# Patient Record
Sex: Female | Born: 1950 | Race: White | Hispanic: No | Marital: Married | Smoking: Former smoker
Health system: Southern US, Community
[De-identification: ages and names within clinical notes are randomized; demographics above are authoritative.]

## PROBLEM LIST (undated history)

## (undated) DIAGNOSIS — Z1211 Encounter for screening for malignant neoplasm of colon: Secondary | ICD-10-CM

## (undated) DIAGNOSIS — F329 Major depressive disorder, single episode, unspecified: Secondary | ICD-10-CM

## (undated) DIAGNOSIS — E063 Autoimmune thyroiditis: Secondary | ICD-10-CM

## (undated) DIAGNOSIS — F410 Panic disorder [episodic paroxysmal anxiety] without agoraphobia: Secondary | ICD-10-CM

## (undated) DIAGNOSIS — R931 Abnormal findings on diagnostic imaging of heart and coronary circulation: Secondary | ICD-10-CM

## (undated) DIAGNOSIS — Z9289 Personal history of other medical treatment: Secondary | ICD-10-CM

## (undated) DIAGNOSIS — Z91199 Patient's noncompliance with other medical treatment and regimen due to unspecified reason: Secondary | ICD-10-CM

## (undated) DIAGNOSIS — Z9119 Patient's noncompliance with other medical treatment and regimen: Secondary | ICD-10-CM

## (undated) DIAGNOSIS — N289 Disorder of kidney and ureter, unspecified: Secondary | ICD-10-CM

## (undated) DIAGNOSIS — I420 Dilated cardiomyopathy: Secondary | ICD-10-CM

## (undated) DIAGNOSIS — F32A Depression, unspecified: Secondary | ICD-10-CM

## (undated) DIAGNOSIS — E041 Nontoxic single thyroid nodule: Secondary | ICD-10-CM

## (undated) DIAGNOSIS — E042 Nontoxic multinodular goiter: Secondary | ICD-10-CM

## (undated) HISTORY — DX: Abnormal findings on diagnostic imaging of heart and coronary circulation: R93.1

## (undated) HISTORY — DX: Autoimmune thyroiditis: E06.3

## (undated) HISTORY — DX: Dilated cardiomyopathy: I42.0

## (undated) HISTORY — DX: Personal history of other medical treatment: Z92.89

## (undated) HISTORY — DX: Depression, unspecified: F32.A

## (undated) HISTORY — DX: Nontoxic multinodular goiter: E04.2

## (undated) HISTORY — DX: Disorder of kidney and ureter, unspecified: N28.9

## (undated) HISTORY — DX: Patient's noncompliance with other medical treatment and regimen due to unspecified reason: Z91.199

## (undated) HISTORY — DX: Panic disorder (episodic paroxysmal anxiety): F41.0

## (undated) HISTORY — DX: Patient's noncompliance with other medical treatment and regimen: Z91.19

## (undated) HISTORY — DX: Major depressive disorder, single episode, unspecified: F32.9

## (undated) HISTORY — DX: Encounter for screening for malignant neoplasm of colon: Z12.11

## (undated) HISTORY — DX: Nontoxic single thyroid nodule: E04.1

---

## 1984-02-23 HISTORY — PX: TUBAL LIGATION: SHX77

## 2005-01-29 ENCOUNTER — Encounter: Admission: RE | Admit: 2005-01-29 | Discharge: 2005-01-29 | Payer: Self-pay | Admitting: Family Medicine

## 2005-02-04 ENCOUNTER — Other Ambulatory Visit: Admission: RE | Admit: 2005-02-04 | Discharge: 2005-02-04 | Payer: Self-pay | Admitting: Interventional Radiology

## 2005-02-04 ENCOUNTER — Encounter (INDEPENDENT_AMBULATORY_CARE_PROVIDER_SITE_OTHER): Payer: Self-pay | Admitting: *Deleted

## 2005-02-04 ENCOUNTER — Encounter: Admission: RE | Admit: 2005-02-04 | Discharge: 2005-02-04 | Payer: Self-pay | Admitting: Family Medicine

## 2005-10-26 ENCOUNTER — Ambulatory Visit: Payer: Self-pay | Admitting: Family Medicine

## 2006-03-01 ENCOUNTER — Encounter: Admission: RE | Admit: 2006-03-01 | Discharge: 2006-03-01 | Payer: Self-pay | Admitting: Endocrinology

## 2006-09-02 ENCOUNTER — Ambulatory Visit: Payer: Self-pay | Admitting: Family Medicine

## 2006-09-06 ENCOUNTER — Ambulatory Visit: Payer: Self-pay | Admitting: Gastroenterology

## 2006-09-06 LAB — CONVERTED CEMR LAB
Sed Rate: 8 mm/hr (ref 0–25)
Vitamin B-12: 1136 pg/mL — ABNORMAL HIGH (ref 211–911)

## 2006-10-06 ENCOUNTER — Ambulatory Visit: Payer: Self-pay | Admitting: Gastroenterology

## 2007-05-27 DIAGNOSIS — K219 Gastro-esophageal reflux disease without esophagitis: Secondary | ICD-10-CM | POA: Insufficient documentation

## 2007-05-27 DIAGNOSIS — F410 Panic disorder [episodic paroxysmal anxiety] without agoraphobia: Secondary | ICD-10-CM | POA: Insufficient documentation

## 2007-05-27 DIAGNOSIS — F411 Generalized anxiety disorder: Secondary | ICD-10-CM | POA: Insufficient documentation

## 2007-05-27 DIAGNOSIS — E039 Hypothyroidism, unspecified: Secondary | ICD-10-CM | POA: Insufficient documentation

## 2007-05-27 DIAGNOSIS — K5090 Crohn's disease, unspecified, without complications: Secondary | ICD-10-CM | POA: Insufficient documentation

## 2007-05-27 DIAGNOSIS — F339 Major depressive disorder, recurrent, unspecified: Secondary | ICD-10-CM | POA: Insufficient documentation

## 2007-12-21 ENCOUNTER — Ambulatory Visit: Payer: Self-pay | Admitting: Family Medicine

## 2007-12-21 ENCOUNTER — Encounter: Admission: RE | Admit: 2007-12-21 | Discharge: 2007-12-21 | Payer: Self-pay | Admitting: Ophthalmology

## 2008-01-10 ENCOUNTER — Ambulatory Visit: Payer: Self-pay | Admitting: Family Medicine

## 2008-01-16 ENCOUNTER — Encounter: Admission: RE | Admit: 2008-01-16 | Discharge: 2008-01-16 | Payer: Self-pay | Admitting: Family Medicine

## 2008-01-17 ENCOUNTER — Ambulatory Visit: Payer: Self-pay | Admitting: Family Medicine

## 2008-06-17 ENCOUNTER — Ambulatory Visit: Payer: Self-pay | Admitting: Family Medicine

## 2008-08-06 ENCOUNTER — Other Ambulatory Visit: Admission: RE | Admit: 2008-08-06 | Discharge: 2008-08-06 | Payer: Self-pay | Admitting: Family Medicine

## 2008-08-06 ENCOUNTER — Ambulatory Visit: Payer: Self-pay | Admitting: Family Medicine

## 2008-08-06 ENCOUNTER — Encounter: Payer: Self-pay | Admitting: Family Medicine

## 2008-08-06 LAB — HM PAP SMEAR: HM Pap smear: NEGATIVE

## 2009-11-14 ENCOUNTER — Ambulatory Visit: Payer: Self-pay | Admitting: Family Medicine

## 2009-11-19 ENCOUNTER — Ambulatory Visit: Payer: Self-pay | Admitting: Family Medicine

## 2009-12-22 ENCOUNTER — Ambulatory Visit: Payer: Self-pay | Admitting: Family Medicine

## 2010-03-14 ENCOUNTER — Encounter: Payer: Self-pay | Admitting: Family Medicine

## 2010-04-02 ENCOUNTER — Ambulatory Visit (INDEPENDENT_AMBULATORY_CARE_PROVIDER_SITE_OTHER): Payer: Medicare Other | Admitting: Family Medicine

## 2010-04-02 DIAGNOSIS — E063 Autoimmune thyroiditis: Secondary | ICD-10-CM

## 2010-04-02 DIAGNOSIS — J029 Acute pharyngitis, unspecified: Secondary | ICD-10-CM

## 2010-07-02 ENCOUNTER — Encounter: Payer: Self-pay | Admitting: Family Medicine

## 2010-07-02 ENCOUNTER — Ambulatory Visit (INDEPENDENT_AMBULATORY_CARE_PROVIDER_SITE_OTHER): Payer: Medicare Other | Admitting: Family Medicine

## 2010-07-02 ENCOUNTER — Telehealth: Payer: Self-pay | Admitting: *Deleted

## 2010-07-02 ENCOUNTER — Ambulatory Visit: Payer: Medicare Other | Admitting: Family Medicine

## 2010-07-02 DIAGNOSIS — J309 Allergic rhinitis, unspecified: Secondary | ICD-10-CM

## 2010-07-02 DIAGNOSIS — I499 Cardiac arrhythmia, unspecified: Secondary | ICD-10-CM

## 2010-07-02 DIAGNOSIS — H8110 Benign paroxysmal vertigo, unspecified ear: Secondary | ICD-10-CM

## 2010-07-02 MED ORDER — MECLIZINE HCL 12.5 MG PO TABS: 12.5000 mg | ORAL_TABLET | Freq: Three times a day (TID) | ORAL | Status: DC | PRN

## 2010-07-02 MED ORDER — MOMETASONE FUROATE 50 MCG/ACT NA SUSP: 2.0000 | Freq: Every day | NASAL | Status: DC

## 2010-07-02 NOTE — Patient Instructions (Addendum)
Have benign positional vertigo I will call in a medication that you can take as needed for the dizziness. If you do not get better, call me for further evaluation I will also call in a nasal steroid spray which should help with your swelling Use Neosporin ointment for the irritation on the tip of her nose

## 2010-07-02 NOTE — Progress Notes (Signed)
  Subjective:    Patient ID: Rita Wright, female    DOB: 1950/03/16, 60 y.o.   MRN: [id number]  Neurologic Problem The patient's primary symptoms include a loss of balance. This is a new problem. The current episode started today. The altered mental status developed suddenly. The problem is unchanged. There was no focality noted. Associated symptoms include dizziness and vertigo. Past treatments include nothing. The treatment provided no relief.   she also complains of nasal and sinus congestion but minimal changes with breathing.   Review of Systems  Constitutional: Negative.   HENT: Positive for congestion, sneezing and sinus pressure.   Eyes: Negative.   Neurological: Positive for dizziness, vertigo and loss of balance.       Objective:   Physical Exam  Vitals reviewed. Constitutional: She appears well-developed and well-nourished.  Eyes: Conjunctivae and EOM are normal. Pupils are equal, round, and reactive to light.  Neck: Normal range of motion.  Cardiovascular: Normal heart sounds.        Cardiac rhythm is irregular   his mucosa is slightly red with some erythema especially anteriorly EKG compared to previous tracing is essentially unchanged       Assessment & Plan:  See chronic problem list. Review of her previous record indicates she has had a cardiology evaluation for the EKG and apparently no further workup was needed.

## 2010-07-06 NOTE — Telephone Encounter (Signed)
Routed to [employer]

## 2010-07-07 NOTE — Assessment & Plan Note (Signed)
[facility]                         GASTROENTEROLOGY OFFICE NOTE   NAME:Rita Wright, Rita Wright                       MRN:          [id number]  DATE:10/06/2006                            DOB:          11/12/1950    Rita Wright refused to complete her colonoscopy because of various reasons  which really are unclear to me, but she seems convinced in her mind that  she had hypoglycemia before the prep, even though she never took any  Osmoprep tablets as suggested.  She refuses followup colonoscopy exam.   She denies any GI complaints and currently taking Lialda 2.4 g a day  along with daily folic acid.  Her IBD serologies were negative and her  sedimentation rate was normal.   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  She refuses further  evaluation.   RECOMMENDATIONS:  1. Continue Lialda and folic acid at regular doses.  2. GI followup p.r.n.  3. Colonoscopy needed to reassess the patient, but she again has      refused.  I have asked for Hemoccult cards for guaiac testing.     Vania Rea. Jarold Motto, MD, Caleen Essex, FAGA  Electronically Signed    DRP/MedQ  DD: 10/06/2006  DT: 10/07/2006  Job #: [id number]   cc:   Sharlot Gowda, M.D.

## 2010-07-07 NOTE — Assessment & Plan Note (Signed)
[facility]                         GASTROENTEROLOGY OFFICE NOTE   NAME:Glassburn, VIVIANNA Wright                       MRN:          [id number]  DATE:09/06/2006                            DOB:          Mar 05, 1950    CHIEF COMPLAINT:  Rita Wright is a 60 year old white female referred by  Dr. Sharlot Gowda for evaluation of abdominal cramping, diarrhea and  rectal bleeding.   HISTORY OF PRESENT ILLNESS:  Apparently Rita Wright had Crohn's disease,  diagnosed some 30 years ago when she was hospitalized at [facility].  These records are not available for review.  Since that time  she has been treated by Dr. Juluis Mire with periodic Azulfidine  and prednisone but has not been back to see [place] in followup.  She had  been doing well without serious gastrointestinal problems until four to  five days ago, when she developed sudden crampy lower abdominal pain  with diarrhea, tenesmus and then some rectal bleeding.  She saw Dr.  Susann Givens and had the guaiac-positive stool and was placed on prednisone  10 mg q.d.  She has had good response.  Her laboratory work revealed a  normal CBC and metabolic profile.  The patient denies the abuse of  NSAIDs or other medications or decongestants.  She has had no fever,  chills, skin rashes or joint pains but has had severe caries in her  teeth, with her teeth falling out.  She has not had any barium studies,  endoscopic examination or other x-rays of her gut in many years.  She  says her appetite is good and her weight is stable.  Dr. Jola Babinski notes  say that the patient does have a history of osteoporosis from frequent  steroid use.   PAST MEDICAL/SURGICAL HISTORY:  1. A previous tubal ligation.  2. Chronic anxiety disorder with frequent panic attacks.  3. History of chronic thyroid goiter and several biopsies by Dr.      Leonie Man, but has had no evidence of malignancy.  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   MEDICATIONS:  1. Prednisone currently 6 mg q.d.  2. Levothyroxine 88 mcg q.d.   FAMILY HISTORY:  She has a sister who apparently has Crohn's disease,  although this is unclear.   SOCIAL HISTORY:  She is married and lives with her husband.  She has a  high school education.  She currently is not working.  She has not  smoked in 15 years.  She does not use alcohol.   REVIEW OF SYSTEMS:  Positive for chronic anxiety with associated  depression.  Chronic low back pain.  She denies any cardiovascular  complaints except for periodic cardiac arrhythmias.  Apparently has had  a cardiology evaluation which was unremarkable.  She denies any current  cardiovascular, pulmonary, genitourinary, neurologic or other orthopedic  problems.  She is not on calcium replacement.   PHYSICAL EXAMINATION:  GENERAL:  She is a somewhat older-appearing white  female, than her stated age.  She is in no acute distress.  HEENT:  Obvious poor dental hygiene.  I could not appreciate stigmata of  chronic liver disease.  Oropharynx:  Showed multiple missing teeth and  fairly poor gum hygiene but no active ulcerations or bleeding.  VITAL SIGNS:  Height 5 feet 6 inches, weight 153 pounds, blood pressure  126/68, pulse 84 and regular.  CHEST:  Clear.  HEART:  She appeared to have a mild irregularity of her heart without  any murmurs, gallops or rubs.  ABDOMEN:  No organomegaly, masses or significant tenderness at this  time.  EXTREMITIES:  Peripheral extremities were unremarkable without edema or  phlebitis.  NEUROLOGIC:  Mental status was clear.  RECTAL:  Examination ws deferred.   ASSESSMENT:  1. 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 Ellene Route in a relapsing acute manner, without      any chronic therapy, which she probably will need.  The chronicity      of her disease certainly mandates that we exclude dysplasia and       carcinoma.  2. History of gastroesophageal reflux disease.  Rule out      cholelithiasis.  3. Probable osteoporosis from prednisone use over the years.  4. Chronic anxiety and depression.  5. History of thyroiditis, on Synthroid replacement therapy.  6. Questionable family history of Crohn's disease.  7. Vague history of B12 deficiency.   RECOMMENDATIONS:  1. Check IBD serologies, sedimentation rate and B12 and folate level.  2. Outpatient colonoscopy examination as soon as possible.  3. Continue prednisone 10 mg q.d.  4. Start Lialda 2.4 grams q.d.  5. Consider an ultrasound exam and endoscopy, depending upon her      clinical course and workup.  6. Consider bone density scanning and need for vitamin and calcium      replacement therapy.     Vania Rea. Jarold Motto, MD, Caleen Essex, FAGA  Electronically Signed    DRP/MedQ  DD: 09/06/2006  DT: 09/06/2006  Job #: [id number]   cc:   Sharlot Gowda, M.D.

## 2010-07-09 ENCOUNTER — Telehealth: Payer: Self-pay | Admitting: Family Medicine

## 2010-07-09 NOTE — Telephone Encounter (Signed)
Pt informed Dr.lalonde dosent think she has an infection if still having problems for her to come back in

## 2010-07-09 NOTE — Telephone Encounter (Signed)
PT CALLED STATES DIZZINESS BETTER, STILL FEELS LIKE FLUID IN EARS AND WANTS ANTIBIOTIC- [facility]-CANT TAKE E-MYCINS, ALLERGIC TO [facility], CAN TAKE CIPRO PLEASE CALL PT AND ADVISE LM 5/17

## 2010-07-09 NOTE — Telephone Encounter (Signed)
I don't think an antibiotic will help her. If she has difficulty, have her return here for an appointment

## 2010-07-10 NOTE — Letter (Signed)
October 19, 2006    Rita Wright  [address]  [place], [place] [address]   RE:  [name], [name]  MRN:  [id number]  /  DOB:  1950-09-05   Dear [name]:   Because of your noncompliance and difficulty managing your case because  of such, I am withdrawing from your gastroenterology care. We would be  glad to forward your records to any local gastroenterologist that you  choose.    Sincerely,      Vania Rea. Jarold Motto, MD, Caleen Essex, FAGA  Electronically Signed    DRP/MedQ  DD: 10/19/2006  DT: 10/20/2006  Job #: [id number]   CC:    Sharlot Gowda, M.D.

## 2010-10-23 ENCOUNTER — Encounter: Payer: Self-pay | Admitting: Family Medicine

## 2010-10-23 ENCOUNTER — Encounter: Payer: Self-pay | Admitting: Medical

## 2010-10-23 ENCOUNTER — Ambulatory Visit (INDEPENDENT_AMBULATORY_CARE_PROVIDER_SITE_OTHER): Payer: Medicare Other | Admitting: Medical

## 2010-10-23 VITALS — BP 146/88 | HR 72 | Temp 98.2°F

## 2010-10-23 DIAGNOSIS — R21 Rash and other nonspecific skin eruption: Secondary | ICD-10-CM

## 2010-10-23 NOTE — Progress Notes (Signed)
  Subjective:   HPI Rita Wright is a 60 y.o. female who presents for rash.  She notes 2 weeks ago started getting rash across face and cheeks, itchy.  Though it was poison oak due to prior hx/o rash with poison oak.  However, rash began spreading to left shoulder, abdomen, up center of chest.  Now rash is from waist to eyeballs.  She is using aloe and gold bond powder, but at times A&D ointment when improved to help with dryness.  Rash it itchy throughout.  Denies contacts with similar rash.  She notes that the only recent med changes was that she had to change VIt D brands recently.  Otherwise no changes in meds, no new food or medication exposure, no new plant exposure.  No other aggravating or relieving factors.  No other c/o.  The following portions of the patient's history were reviewed and updated as appropriate: allergies, current medications, past family history, past medical history, past social history, past surgical history and problem list.  Past Medical History  Diagnosis Date  . Osteoporosis   . Hashimoto's thyroiditis   . Multinodular goiter     Review of Systems Gen: no fever, chills, sweats HEENT: no ST, runny nose, congestion, hoarseness GI: no abdominal pain, N/V/D Heart: no CP, palpitations Lungs: no SOB, cough MSK: no joint swelling, pain      Objective:   Physical Exam  General appearance: alert, no distress, WD/WN Skin: dry skin throughout, some flaking on shoulders, back; cheeks, inferior orbits, neck, chest, somewhat on upper lateral arms, and beneath breast with mild erythema, few scattered excoriations, but otherwise no papules, no vesicles, no drainage, no induration, no fluctuance HEENT: unremarkable Oral: MMM, no lesions Extremities: no edema    Assessment :    Encounter Diagnosis  Name Primary?  . Rash Yes    Plan:    Skin appears dry in general, in addition to rash, etiology likely atopic, and less likely drug reaction, contact dermatitis  or other etiology.  Advised daily moisturizing lotion such as Lubriderm daily, can use topical hydrocortisone on worse rash.  Advised Benadryl OTC once to BID for next several days.  Call if not improving in 3-4 days.

## 2010-12-28 ENCOUNTER — Encounter: Payer: Self-pay | Admitting: Family Medicine

## 2010-12-28 ENCOUNTER — Ambulatory Visit (INDEPENDENT_AMBULATORY_CARE_PROVIDER_SITE_OTHER): Admitting: Family Medicine

## 2010-12-28 DIAGNOSIS — N390 Urinary tract infection, site not specified: Secondary | ICD-10-CM

## 2010-12-28 DIAGNOSIS — Z23 Encounter for immunization: Secondary | ICD-10-CM

## 2010-12-28 DIAGNOSIS — R300 Dysuria: Secondary | ICD-10-CM

## 2010-12-28 LAB — POCT URINALYSIS DIPSTICK
Bilirubin, UA: NEGATIVE
Glucose, UA: NEGATIVE
Ketones, UA: NEGATIVE
Spec Grav, UA: 1.01

## 2010-12-28 MED ORDER — CIPROFLOXACIN HCL 250 MG PO TABS: 250.0000 mg | ORAL_TABLET | Freq: Two times a day (BID) | ORAL | Status: AC

## 2010-12-28 NOTE — Patient Instructions (Signed)
Please call if your symptoms aren't improving with the antibiotics, or if you have any problems tolerating the antibiotic

## 2010-12-28 NOTE — Progress Notes (Signed)
Patient presents with possible UTI.  Symptoms began yesterday, with urgency, frequency.  By 3rd or 4th void, starting having tingling discomfort, "spasm" at end of void.  Also started noticing some blood in urine.  This morning, is noticing less blood, and describes discomfort more as a tingle, and less as a spasm.  Urgency/frequency is less this morning than it was last night.    Last UTI was 30 years ago, and recalls that being much more painful.  Denies fevers, nausea or vomiting.  Occasional sharp pain in lower abdomen after voiding, with some radiation to both sides of lower abdomen.  This was short-lived.  Denies any flank pain, just typical mild low back pain.  Past Medical History  Diagnosis Date  . Osteoporosis   . Hashimoto's thyroiditis   . Multinodular goiter     History reviewed. No pertinent past surgical history.  History   Social History  . Marital Status: Married    Spouse Name: N/A    Number of Children: N/A  . Years of Education: N/A   Occupational History  . Not on file.   Social History Main Topics  . Smoking status: Former Smoker    Types: Cigarettes    Quit date: 02/23/1992  . Smokeless tobacco: Never Used  . Alcohol Use: No  . Drug Use: No  . Sexually Active: Not on file   Other Topics Concern  . Not on file   Social History Narrative  . No narrative on file   Current Outpatient Prescriptions on File Prior to Visit  Medication Sig Dispense Refill  . Calcium Carb-Cholecalciferol (CALCIUM-VITAMIN D3) 500-400 MG-UNIT TABS Take 400 mg by mouth daily.        . Folic Acid 20 MG CAPS Take 10 mg by mouth daily.        Marland Kitchen levothyroxine (SYNTHROID, LEVOTHROID) 100 MCG tablet Take 100 mcg by mouth daily.        . mometasone (NASONEX) 50 MCG/ACT nasal spray 2 sprays by Nasal route daily.  17 g  12    Allergies  Allergen Reactions  . Penicillins Hives, Rash and Other (See Comments)    Felt like she was going to pass out.   ROS:  Denies fevers, nausea,  vomiting, flank pain, abdominal pain (except as per HPI).  Denies rashes, or other concerns. See HPI. Denies vaginal discharge or bleeding/spotting  PHYSICAL EXAM: BP 122/70  Pulse 68  Temp(Src) 98.2 F (36.8 C) (Oral)  Ht 5' 6.75" (1.695 m)  Wt 162 lb (73.483 kg)  BMI 25.56 kg/m2  LMP 02/23/1992 Pleasant female in no distress Back: No CVA tenderness Abdomen: soft, nontender, no organomegaly or mass  Urine dip: 2+ blood, 1+ leuks  ASSESSMENT/PLAN: 1. Dysuria  POCT Urinalysis Dipstick  2. Need for prophylactic vaccination and inoculation against influenza  Flu vaccine greater than or equal to 3yo preservative free IM  3. Urinary tract infection, site not specified  ciprofloxacin (CIPRO) 250 MG tablet, Urine Culture    UTI--send for culture. Treat with cipro

## 2010-12-30 LAB — URINE CULTURE: Colony Count: >=100000

## 2011-02-17 ENCOUNTER — Other Ambulatory Visit: Payer: Self-pay | Admitting: Family Medicine

## 2011-03-31 DIAGNOSIS — M545 Low back pain, unspecified: Secondary | ICD-10-CM | POA: Diagnosis not present

## 2011-05-25 ENCOUNTER — Other Ambulatory Visit: Payer: Self-pay | Admitting: Family Medicine

## 2011-07-27 DIAGNOSIS — D3130 Benign neoplasm of unspecified choroid: Secondary | ICD-10-CM | POA: Diagnosis not present

## 2011-07-27 DIAGNOSIS — H2510 Age-related nuclear cataract, unspecified eye: Secondary | ICD-10-CM | POA: Diagnosis not present

## 2012-02-01 ENCOUNTER — Ambulatory Visit (INDEPENDENT_AMBULATORY_CARE_PROVIDER_SITE_OTHER): Payer: Medicare Other | Admitting: Medical

## 2012-02-01 ENCOUNTER — Encounter: Payer: Self-pay | Admitting: Medical

## 2012-02-01 VITALS — BP 120/80 | HR 82 | Temp 98.3°F | Resp 16 | Wt 162.0 lb

## 2012-02-01 DIAGNOSIS — J329 Chronic sinusitis, unspecified: Secondary | ICD-10-CM

## 2012-02-01 DIAGNOSIS — R05 Cough: Secondary | ICD-10-CM | POA: Diagnosis not present

## 2012-02-01 DIAGNOSIS — R059 Cough, unspecified: Secondary | ICD-10-CM

## 2012-02-01 DIAGNOSIS — L089 Local infection of the skin and subcutaneous tissue, unspecified: Secondary | ICD-10-CM | POA: Diagnosis not present

## 2012-02-01 MED ORDER — MOMETASONE FUROATE 50 MCG/ACT NA SUSP: 2.0000 | Freq: Every day | NASAL | Status: DC

## 2012-02-01 MED ORDER — LEVOFLOXACIN 500 MG PO TABS: 500.0000 mg | ORAL_TABLET | Freq: Every day | ORAL | Status: DC

## 2012-02-01 MED ORDER — MUPIROCIN 2 % EX OINT: TOPICAL_OINTMENT | Freq: Three times a day (TID) | CUTANEOUS | Status: DC

## 2012-02-01 NOTE — Progress Notes (Signed)
Subjective: Here for illness.  Has had 10 days of illness. Had flu like illness, achy, sore throat, congestion, but some of these symptoms have resolved.  Currently can't get rid of cough, head is still having lots of pressure. Ribs hurting from coughing so much.  Denies fever, but occasional nausea with cough spells.   Daughter been sick too.  Not using anything last few days other than tylenol for symptoms.   Using humidifier, nasal saline.  Past Medical History  Diagnosis Date  . Osteoporosis   . Hashimoto's thyroiditis   . Multinodular goiter    ROS as in HPI    Objective:   Physical Exam  Filed Vitals:   02/01/12 0809  BP: 120/80  Pulse: 82  Temp: 98.3 F (36.8 C)  Resp: 16    General appearance: alert, no distress, WD/WN, mildly ill appearing Skin: face inferior to nasal septum with with small area of erythema and crusting, suggestive of localized skin infection HEENT: normocephalic, sclerae anicteric, TMs pearly, nares with turbinate swelling and erythema, pharynx with erythema, no exudate Oral cavity: MMM, no lesions Neck: supple, mildly tender generalized anterior, no lymphadenopathy, no thyromegaly, no masses Heart: RRR, normal S1, S2, no murmurs Lungs: CTA bilaterally, no wheezes, rhonchi, or rales   Assessment and Plan :    Encounter Diagnoses  Name Primary?  . Sinusitis Yes  . Cough   . Skin infection    Sinusitis - begin Levaquin, rest, hydrate well, c/t nasal saline  Cough - Mucinex DM OTC  Skin infection - mupirocin cream to area under the nose.   Call/return if worse or not improving.  Return soon for fasting labs and complete physical.

## 2012-02-23 HISTORY — PX: COLONOSCOPY: SHX174

## 2012-02-24 DIAGNOSIS — D3130 Benign neoplasm of unspecified choroid: Secondary | ICD-10-CM | POA: Diagnosis not present

## 2012-02-24 DIAGNOSIS — H2510 Age-related nuclear cataract, unspecified eye: Secondary | ICD-10-CM | POA: Diagnosis not present

## 2012-03-03 ENCOUNTER — Other Ambulatory Visit (INDEPENDENT_AMBULATORY_CARE_PROVIDER_SITE_OTHER): Payer: Medicare Other

## 2012-03-03 DIAGNOSIS — Z23 Encounter for immunization: Secondary | ICD-10-CM | POA: Diagnosis not present

## 2012-03-06 ENCOUNTER — Other Ambulatory Visit: Payer: Medicare Other

## 2012-03-24 ENCOUNTER — Other Ambulatory Visit: Payer: Self-pay | Admitting: Family Medicine

## 2012-04-05 ENCOUNTER — Encounter: Payer: Self-pay | Admitting: Internal Medicine

## 2012-04-18 ENCOUNTER — Ambulatory Visit (INDEPENDENT_AMBULATORY_CARE_PROVIDER_SITE_OTHER): Payer: Medicare Other | Admitting: Family Medicine

## 2012-04-18 ENCOUNTER — Encounter: Payer: Self-pay | Admitting: Family Medicine

## 2012-04-18 VITALS — BP 110/70 | HR 66 | Wt 159.0 lb

## 2012-04-18 DIAGNOSIS — E039 Hypothyroidism, unspecified: Secondary | ICD-10-CM

## 2012-04-18 DIAGNOSIS — K5090 Crohn's disease, unspecified, without complications: Secondary | ICD-10-CM | POA: Diagnosis not present

## 2012-04-18 DIAGNOSIS — J309 Allergic rhinitis, unspecified: Secondary | ICD-10-CM

## 2012-04-18 DIAGNOSIS — J302 Other seasonal allergic rhinitis: Secondary | ICD-10-CM

## 2012-04-18 DIAGNOSIS — Z79899 Other long term (current) drug therapy: Secondary | ICD-10-CM | POA: Diagnosis not present

## 2012-04-18 DIAGNOSIS — F410 Panic disorder [episodic paroxysmal anxiety] without agoraphobia: Secondary | ICD-10-CM

## 2012-04-18 LAB — CBC WITH DIFFERENTIAL/PLATELET
Hemoglobin: 15.1 g/dL — ABNORMAL HIGH (ref 12.0–15.0)
Lymphocytes Relative: 23 % (ref 12–46)
Lymphs Abs: 1 10*3/uL (ref 0.7–4.0)
Monocytes Relative: 5 % (ref 3–12)
Neutrophils Relative %: 67 % (ref 43–77)
Platelets: 158 10*3/uL (ref 150–400)
RBC: 4.88 MIL/uL (ref 3.87–5.11)
WBC: 4.5 10*3/uL (ref 4.0–10.5)

## 2012-04-18 NOTE — Progress Notes (Signed)
  Subjective:    Patient ID: Rita Wright, female    DOB: January 20, 1951, 62 y.o.   MRN: [id number]  HPI    Review of Systems     Objective:   Physical Exam        Assessment & Plan:

## 2012-04-18 NOTE — Progress Notes (Signed)
  Subjective:    Patient ID: Rita Wright, female    DOB: 11/04/1950, 62 y.o.   MRN: [id number]  HPI She is here for medication check. She continues on thyroid replacement. There is a previous history of Hashimoto's thyroiditis and multinodular goiter written in the chart. She also has an underlying history of panic and depression and apparently has been disabled because of this. She has a history of Crohn's disease however has not followed up with GI in several years.an attempt was made to do a colonoscopy however she did not tolerate the prep. She also has underlying allergies which seems to be under good control.   Review of Systems     Objective:   Physical Exam alert and in no distress. Tympanic membranes and canals are normal. Throat is clear. Tonsils are normal. Neck is supple without adenopathy or thyromegaly. Cardiac exam shows a regular sinus rhythm without murmurs or gallops. Lungs are clear to auscultation. DTRs are normal.       Assessment & Plan:  CROHN'S DISEASE - Plan: [facility] COLONOSCOPY  Hypothyroid - Plan: TSH  Panic disorder  Allergic rhinitis, seasonal  Encounter for long-term (current) use of other medications - Plan: Lipid panel, CBC with Differential, Comprehensive metabolic panel she is to continue on her present medication regimen. Discussed the fact that she needs to tell the GI Dr. Claiborne Billings she had difficulty dealing with those medications.

## 2012-04-18 NOTE — Progress Notes (Signed)
PT HAS APT WITH DR.HUNG MARCH 5TH AT 9:30 am

## 2012-04-19 ENCOUNTER — Other Ambulatory Visit: Payer: Self-pay

## 2012-04-19 LAB — COMPREHENSIVE METABOLIC PANEL
ALT: 21 U/L (ref 0–35)
Albumin: 3.8 g/dL (ref 3.5–5.2)
CO2: 30 mEq/L (ref 19–32)
Calcium: 9.6 mg/dL (ref 8.4–10.5)
Chloride: 104 mEq/L (ref 96–112)
Glucose, Bld: 99 mg/dL (ref 70–99)
Potassium: 4.3 mEq/L (ref 3.5–5.3)
Sodium: 140 mEq/L (ref 135–145)
Total Protein: 6.6 g/dL (ref 6.0–8.3)

## 2012-04-19 LAB — LIPID PANEL
Cholesterol: 178 mg/dL (ref 0–200)
Total CHOL/HDL Ratio: 2.8 Ratio
VLDL: 10 mg/dL (ref 0–40)

## 2012-04-19 LAB — TSH: TSH: 3.97 u[IU]/mL (ref 0.350–4.500)

## 2012-04-19 MED ORDER — LEVOTHYROXINE SODIUM 100 MCG PO TABS: ORAL_TABLET | ORAL | Status: DC

## 2012-04-19 NOTE — Progress Notes (Signed)
Quick Note:  CALLED PT SHE VERBALIZED UNDERSTANDING ______

## 2012-04-19 NOTE — Progress Notes (Signed)
Quick Note:  The labs look good. Continue present medication regimen. ______

## 2012-05-29 ENCOUNTER — Other Ambulatory Visit: Payer: Self-pay | Admitting: Family Medicine

## 2012-06-22 ENCOUNTER — Other Ambulatory Visit: Payer: Self-pay

## 2012-06-22 MED ORDER — LEVOTHYROXINE SODIUM 100 MCG PO TABS: ORAL_TABLET | ORAL | Status: DC

## 2012-06-22 NOTE — Telephone Encounter (Signed)
SENT IN [facility] PER FAX REQUEST FOR 90 DAY

## 2012-07-24 DIAGNOSIS — H2510 Age-related nuclear cataract, unspecified eye: Secondary | ICD-10-CM | POA: Diagnosis not present

## 2012-07-24 DIAGNOSIS — D3130 Benign neoplasm of unspecified choroid: Secondary | ICD-10-CM | POA: Diagnosis not present

## 2012-08-04 ENCOUNTER — Encounter: Payer: Self-pay | Admitting: Family Medicine

## 2012-08-14 ENCOUNTER — Ambulatory Visit (INDEPENDENT_AMBULATORY_CARE_PROVIDER_SITE_OTHER): Payer: Medicare Other | Admitting: Family Medicine

## 2012-08-14 ENCOUNTER — Other Ambulatory Visit (HOSPITAL_COMMUNITY)
Admission: RE | Admit: 2012-08-14 | Discharge: 2012-08-14 | Disposition: A | Discharge status: Home / Self Care | Payer: Medicare Other | Source: Ambulatory Visit | Attending: Family Medicine | Admitting: Family Medicine

## 2012-08-14 ENCOUNTER — Encounter: Payer: Self-pay | Admitting: Family Medicine

## 2012-08-14 VITALS — BP 110/60 | HR 67 | Ht 66.5 in | Wt 156.0 lb

## 2012-08-14 DIAGNOSIS — E039 Hypothyroidism, unspecified: Secondary | ICD-10-CM

## 2012-08-14 DIAGNOSIS — F410 Panic disorder [episodic paroxysmal anxiety] without agoraphobia: Secondary | ICD-10-CM

## 2012-08-14 DIAGNOSIS — K219 Gastro-esophageal reflux disease without esophagitis: Secondary | ICD-10-CM

## 2012-08-14 DIAGNOSIS — Z1239 Encounter for other screening for malignant neoplasm of breast: Secondary | ICD-10-CM | POA: Diagnosis not present

## 2012-08-14 DIAGNOSIS — Z124 Encounter for screening for malignant neoplasm of cervix: Secondary | ICD-10-CM | POA: Insufficient documentation

## 2012-08-14 DIAGNOSIS — K5090 Crohn's disease, unspecified, without complications: Secondary | ICD-10-CM

## 2012-08-14 DIAGNOSIS — M810 Age-related osteoporosis without current pathological fracture: Secondary | ICD-10-CM

## 2012-08-14 DIAGNOSIS — Z129 Encounter for screening for malignant neoplasm, site unspecified: Secondary | ICD-10-CM

## 2012-08-14 NOTE — Progress Notes (Signed)
Subjective:    Patient ID: Rita Wright, female    DOB: 10-24-1950, 62 y.o.   MRN: [id number]  HPI She is here for a medication check. She does have underlying thyroid disease and recent blood work looks good. She also has reflux disease but at this time is not having any difficulty. She has a history of Crohn's disease and unfortunately she has not been able to get a colonoscopy done due to adverse reaction from the prep. She has seen 2 different gastroenterologists and at this point is not interested in further evaluation. She does recognize the risk of colon cancer. She also has a history of osteoporosis and has had 2 different medications and did not tolerate either one. She is again not interested in pursuing this any further. She has an underlying panic disorder but states she only has pain to take every couple of years. She does give a history of difficulty with this starting when she has several relatives die several years ago. Her social and family history were reviewed.   Review of Systems Negative except as above    Objective:   Physical Exam BP 110/60  Pulse 67  Ht 5' 6.5" (1.689 m)  Wt 156 lb (70.761 kg)  BMI 24.8 kg/m2  LMP 02/23/1992  General Appearance:    Alert, cooperative, no distress, appears stated age  Head:    Normocephalic, without obvious abnormality, atraumatic  Eyes:    PERRL, conjunctiva/corneas clear, EOM's intact, fundi    benign  Ears:    Normal TM's and external ear canals  Nose:   Nares normal, mucosa normal, no drainage or sinus   tenderness  Throat:   Lips, mucosa, and tongue normal; teeth and gums normal  Neck:   Supple, no lymphadenopathy;  thyroid:  no   enlargement/tenderness/nodules; no carotid   bruit or JVD  Back:    Spine nontender, no curvature, ROM normal, no CVA     tenderness  Lungs:     Clear to auscultation bilaterally without wheezes, rales or     ronchi; respirations unlabored  Chest Wall:    No tenderness or deformity   Heart:     Regular rate and rhythm, S1 and S2 normal, no murmur, rub   or gallop  Breast Exam:    No tenderness, masses, or nipple discharge or inversion.      No axillary lymphadenopathy  Abdomen:     Soft, non-tender, nondistended, normoactive bowel sounds,    no masses, no hepatosplenomegaly  Genitalia:    Normal external genitalia without lesions.  BUS and vagina normal; cervix without lesions, or cervical motion tenderness. No abnormal vaginal discharge.  Uterus and adnexa not enlarged, nontender, no masses.  Pap performed  Rectal:    Normal tone, no masses or tenderness; guaiac negative stool  Extremities:   No clubbing, cyanosis or edema  Pulses:   2+ and symmetric all extremities  Skin:   Skin color, texture, turgor normal, no rashes or lesions  Lymph nodes:   Cervical, supraclavicular, and axillary nodes normal  Neurologic:   CNII-XII intact, normal strength, sensation and gait; reflexes 2+ and symmetric throughout          Psych:   Normal mood, affect, hygiene and grooming.         Assessment & Plan:  Hypothyroid  GERD - Plan: Hemoccult - 1 Card (office)  CROHN'S DISEASE  Screening breast examination - Plan: MM Digital Diagnostic Bilat  Screening for cancer - Plan: Cytology -  PAP [place]  PANIC DISORDER  Osteoporosis, unspecified continue present medications. Discussed the fact that she recognizes the risk of colon cancer with no further followup on her Crohn's disease. She also recognizes the danger of not following up on the osteoporosis and she is willing to take that risk.

## 2012-08-15 LAB — HM PAP SMEAR: HM Pap smear: NEGATIVE

## 2012-11-17 ENCOUNTER — Other Ambulatory Visit (INDEPENDENT_AMBULATORY_CARE_PROVIDER_SITE_OTHER): Payer: Medicare Other

## 2012-11-17 DIAGNOSIS — Z23 Encounter for immunization: Secondary | ICD-10-CM | POA: Diagnosis not present

## 2013-01-22 ENCOUNTER — Encounter: Payer: Self-pay | Admitting: Internal Medicine

## 2013-01-22 DIAGNOSIS — D3130 Benign neoplasm of unspecified choroid: Secondary | ICD-10-CM | POA: Diagnosis not present

## 2013-05-19 ENCOUNTER — Other Ambulatory Visit: Payer: Self-pay | Admitting: Family Medicine

## 2013-05-28 ENCOUNTER — Ambulatory Visit: Admitting: Medical

## 2013-06-12 ENCOUNTER — Other Ambulatory Visit: Payer: Self-pay | Admitting: Family Medicine

## 2013-09-11 ENCOUNTER — Other Ambulatory Visit: Payer: Self-pay | Admitting: Family Medicine

## 2013-09-11 NOTE — Telephone Encounter (Signed)
PATIENT NEEDS AN APPOINTMENT ASAP TO RECHECK HER THYROID. LAST SEEN 07/2012

## 2013-10-09 ENCOUNTER — Other Ambulatory Visit: Payer: Self-pay | Admitting: Family Medicine

## 2013-11-07 ENCOUNTER — Other Ambulatory Visit: Payer: Self-pay | Admitting: Family Medicine

## 2013-11-22 ENCOUNTER — Ambulatory Visit (INDEPENDENT_AMBULATORY_CARE_PROVIDER_SITE_OTHER): Payer: Medicare Other | Admitting: Family Medicine

## 2013-11-22 ENCOUNTER — Encounter: Payer: Self-pay | Admitting: Family Medicine

## 2013-11-22 VITALS — BP 110/70 | HR 68 | Ht 66.0 in | Wt 147.0 lb

## 2013-11-22 DIAGNOSIS — E039 Hypothyroidism, unspecified: Secondary | ICD-10-CM

## 2013-11-22 DIAGNOSIS — F410 Panic disorder [episodic paroxysmal anxiety] without agoraphobia: Secondary | ICD-10-CM

## 2013-11-22 DIAGNOSIS — M810 Age-related osteoporosis without current pathological fracture: Secondary | ICD-10-CM | POA: Diagnosis not present

## 2013-11-22 DIAGNOSIS — Z79899 Other long term (current) drug therapy: Secondary | ICD-10-CM | POA: Diagnosis not present

## 2013-11-22 DIAGNOSIS — K219 Gastro-esophageal reflux disease without esophagitis: Secondary | ICD-10-CM | POA: Diagnosis not present

## 2013-11-22 DIAGNOSIS — K50919 Crohn's disease, unspecified, with unspecified complications: Secondary | ICD-10-CM

## 2013-11-22 DIAGNOSIS — Z23 Encounter for immunization: Secondary | ICD-10-CM | POA: Diagnosis not present

## 2013-11-22 LAB — CBC WITH DIFFERENTIAL/PLATELET
BASOS PCT: 1 % (ref 0–1)
Basophils Absolute: 0.1 10*3/uL (ref 0.0–0.1)
EOS PCT: 2 % (ref 0–5)
Eosinophils Absolute: 0.1 10*3/uL (ref 0.0–0.7)
HEMATOCRIT: 43.4 % (ref 36.0–46.0)
Hemoglobin: 14.6 g/dL (ref 12.0–15.0)
Lymphocytes Relative: 23 % (ref 12–46)
Lymphs Abs: 1.4 10*3/uL (ref 0.7–4.0)
MCH: 30.8 pg (ref 26.0–34.0)
MCHC: 33.6 g/dL (ref 30.0–36.0)
MCV: 91.6 fL (ref 78.0–100.0)
Monocytes Absolute: 0.4 10*3/uL (ref 0.1–1.0)
Monocytes Relative: 7 % (ref 3–12)
Neutro Abs: 4 10*3/uL (ref 1.7–7.7)
Neutrophils Relative %: 67 % (ref 43–77)
Platelets: 169 10*3/uL (ref 150–400)
RBC: 4.74 MIL/uL (ref 3.87–5.11)
RDW: 12.8 % (ref 11.5–15.5)
WBC: 6 10*3/uL (ref 4.0–10.5)

## 2013-11-22 LAB — COMPREHENSIVE METABOLIC PANEL
ALT: 23 U/L (ref 0–35)
AST: 21 U/L (ref 0–37)
Albumin: 3.7 g/dL (ref 3.5–5.2)
Alkaline Phosphatase: 75 U/L (ref 39–117)
BUN: 38 mg/dL — AB (ref 6–23)
CO2: 24 mEq/L (ref 19–32)
CREATININE: 1.15 mg/dL — AB (ref 0.50–1.10)
Calcium: 9.2 mg/dL (ref 8.4–10.5)
Chloride: 104 mEq/L (ref 96–112)
Glucose, Bld: 94 mg/dL (ref 70–99)
Potassium: 4.6 mEq/L (ref 3.5–5.3)
Sodium: 139 mEq/L (ref 135–145)
Total Bilirubin: 0.5 mg/dL (ref 0.2–1.2)
Total Protein: 6.1 g/dL (ref 6.0–8.3)

## 2013-11-22 LAB — LIPID PANEL
Cholesterol: 181 mg/dL (ref 0–200)
HDL: 66 mg/dL (ref >39)
LDL CALC: 102 mg/dL — AB (ref 0–99)
Total CHOL/HDL Ratio: 2.7 Ratio
Triglycerides: 66 mg/dL (ref <150)
VLDL: 13 mg/dL (ref 0–40)

## 2013-11-22 LAB — TSH: TSH: 1.444 u[IU]/mL (ref 0.350–4.500)

## 2013-11-22 MED ORDER — LEVOTHYROXINE SODIUM 100 MCG PO TABS: ORAL_TABLET | ORAL | Status: DC
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
## 2013-12-05 ENCOUNTER — Other Ambulatory Visit: Payer: Self-pay | Admitting: Family Medicine

## 2014-01-04 ENCOUNTER — Ambulatory Visit (INDEPENDENT_AMBULATORY_CARE_PROVIDER_SITE_OTHER): Payer: Medicare Other | Admitting: Medical

## 2014-01-04 ENCOUNTER — Encounter: Payer: Self-pay | Admitting: Medical

## 2014-01-04 VITALS — BP 112/70 | HR 65 | Temp 97.6°F | Resp 14 | Wt 145.0 lb

## 2014-01-04 DIAGNOSIS — E038 Other specified hypothyroidism: Secondary | ICD-10-CM

## 2014-01-04 DIAGNOSIS — K529 Noninfective gastroenteritis and colitis, unspecified: Secondary | ICD-10-CM

## 2014-01-04 NOTE — Progress Notes (Signed)
Subjective:     Rita Wright is a 63 y.o. female who presents for evaluation of diarrhea.   Here with young grand son and husband.   husband also here fore diarrhea x 2wk.  Onset of diarrhea was mid September, started with crampy abdominal pain, and gas, watery loose stool without blood or fever.  Normally she has a BM every few days to daily.  No recent diet changes, no prior abdominal surgery, no recent travel, no consumption of contaminated water .  Has 4+BMs daily, starts around 4am,m seems to calm down about 9am.  Hx/o questionable Crohns disease.   Has had problems with the proir bowel prep for colonoscopy gets vomiting within minutes of the bowel prep.  Thus, hasn't had colonoscopy.   Has used prednisone prior for bowel issues/possible crohn's, hospitalized for bloody BMs in the past. No flare in few years.  Using Tylenol.  No recent abx.  Recently had a left over prednisone which she took and the diarrhea calmed down.  Has seen Dr. [facility], last visit few years ago..  The following portions of the patient's history were reviewed and updated as appropriate: allergies, current medications, past family history, past medical history, past social history, past surgical history and problem list.  Review of Systems Constitutional: -fever, -chills, -sweats, -unexpected weight change,-fatigue ENT: -runny nose, -ear pain, -sore throat Cardiology:  -chest pain, -palpitations, -edema Respiratory: -cough, -shortness of breath, -wheezing Gastroenterology: -abdominal pain, -nausea, -vomiting, -constipation  Hematology: -bleeding or bruising problems Musculoskeletal: -arthralgias, -myalgias, -joint swelling, -back pain Ophthalmology: -vision changes Urology: -dysuria, -difficulty urinating, -hematuria, -urinary frequency, -urgency Neurology: -headache, -weakness, -tingling, -numbness      Objective:   Filed Vitals:   01/04/14 1437  BP: 112/70  Pulse: 65  Temp: 97.6 F (36.4 C)  Resp: 14     General appearance: alert, no distress, WD/WN, female Conjunctiva: pink, moist Oral cavity: MMM, no lesions Neck: supple, no lymphadenopathy, no thyromegaly, no masses Heart: RRR, normal S1, S2, no murmurs Lungs: CTA bilaterally, no wheezes, rhonchi, or rales Abdomen: +bs, soft, non tender, non distended, no masses, no hepatomegaly, no splenomegaly Pulses: 2+ symmetric, upper and lower extremities, normal cap refill Skin: normal turgor   Assessment:    Encounter Diagnoses  Name Primary?  . Chronic diarrhea Yes  . Other specified hypothyroidism      Plan:   Discussed symptoms, exam, possible differential.   Will get stool studies.   Advised she c/t Probiotic, avoid high fat foods, can eat boiled starches, potatoes, noodles, rice, wheat , oats, cereal, hydrate well with clear fluids, and f/u pending stool studies.   Call or return sooner if worse or not improving.

## 2014-01-05 ENCOUNTER — Other Ambulatory Visit: Payer: Self-pay | Admitting: Medical

## 2014-01-05 DIAGNOSIS — K529 Noninfective gastroenteritis and colitis, unspecified: Secondary | ICD-10-CM | POA: Diagnosis not present

## 2014-01-06 LAB — FECAL LACTOFERRIN, QUANT: Lactoferrin: NEGATIVE

## 2014-01-06 LAB — C. DIFFICILE GDH AND TOXIN A/B
C. difficile GDH: NOT DETECTED
C. difficile Toxin A/B: NOT DETECTED

## 2014-01-08 LAB — OVA AND PARASITE EXAMINATION: OP: NONE SEEN

## 2014-01-09 LAB — STOOL CULTURE

## 2014-01-28 DIAGNOSIS — H2513 Age-related nuclear cataract, bilateral: Secondary | ICD-10-CM | POA: Diagnosis not present

## 2014-01-28 DIAGNOSIS — D3131 Benign neoplasm of right choroid: Secondary | ICD-10-CM | POA: Diagnosis not present

## 2014-03-11 ENCOUNTER — Telehealth: Payer: Self-pay | Admitting: Medical

## 2014-03-11 NOTE — Telephone Encounter (Signed)
Pt took her cat in to vet on Friday due to ear issues and they told her that it has E coli and that she should report this to her doctor especially since pt has been having intestinal problems. Pt says she still has same intestinal symptoms since October and it has not gotten any better or worse. Does she need to come in to be tested for E Coli?

## 2014-03-12 NOTE — Telephone Encounter (Signed)
Patient did not go to the GI doctor she said that she didn't think it was necessary to go at the time so she didn't go. She doesn't have time to go. She is watching her 65 year old granddaughter. She thinks her cat has e-coli. What should she do?

## 2014-03-12 NOTE — Telephone Encounter (Signed)
I went over the message in detail twice and all she kept saying was that she had to be there for her granddaughter because her daughter was going into to labor any day now. I explain to her again her to options again and she states that it will have to wait.

## 2014-03-12 NOTE — Telephone Encounter (Signed)
Per my last note from her stool studies in October, I referred to GI.   I don't see referral info, but there is the message that went to nurse about referral.  I thought I recall seeing GI notes on her though.  Please verify, did she see GI, and if so, let me see the note.   She should also make her GI doctor aware of this info about the pet.   Let me know ASAP

## 2014-03-12 NOTE — Telephone Encounter (Signed)
The issue is that if she has already been taking probiotics, avoiding soda and greasy foods, and other diet measures that worsen loose stool, and given the stool studies were negative when we did them, the next step is either elimination diet, food allergy screening or GI referral.  If desired, have her come in for recheck, but I really think she needs to see GI

## 2014-04-05 ENCOUNTER — Telehealth: Payer: Self-pay | Admitting: Medical

## 2014-04-05 ENCOUNTER — Ambulatory Visit (INDEPENDENT_AMBULATORY_CARE_PROVIDER_SITE_OTHER): Payer: Medicare Other | Admitting: Medical

## 2014-04-05 ENCOUNTER — Encounter: Payer: Self-pay | Admitting: Medical

## 2014-04-05 VITALS — BP 110/70 | HR 72 | Temp 97.6°F | Resp 14 | Wt 141.0 lb

## 2014-04-05 DIAGNOSIS — E739 Lactose intolerance, unspecified: Secondary | ICD-10-CM | POA: Diagnosis not present

## 2014-04-05 DIAGNOSIS — Z8639 Personal history of other endocrine, nutritional and metabolic disease: Secondary | ICD-10-CM

## 2014-04-05 DIAGNOSIS — E049 Nontoxic goiter, unspecified: Secondary | ICD-10-CM | POA: Diagnosis not present

## 2014-04-05 DIAGNOSIS — R142 Eructation: Secondary | ICD-10-CM

## 2014-04-05 DIAGNOSIS — R109 Unspecified abdominal pain: Secondary | ICD-10-CM | POA: Diagnosis not present

## 2014-04-05 DIAGNOSIS — R634 Abnormal weight loss: Secondary | ICD-10-CM | POA: Diagnosis not present

## 2014-04-05 DIAGNOSIS — R195 Other fecal abnormalities: Secondary | ICD-10-CM

## 2014-04-05 DIAGNOSIS — R0789 Other chest pain: Secondary | ICD-10-CM | POA: Diagnosis not present

## 2014-04-05 LAB — CBC
HCT: 44.3 % (ref 36.0–46.0)
Hemoglobin: 15 g/dL (ref 12.0–15.0)
MCH: 31.1 pg (ref 26.0–34.0)
MCHC: 33.9 g/dL (ref 30.0–36.0)
MCV: 91.9 fL (ref 78.0–100.0)
MPV: 10.5 fL (ref 8.6–12.4)
Platelets: 160 10*3/uL (ref 150–400)
RBC: 4.82 MIL/uL (ref 3.87–5.11)
RDW: 12.3 % (ref 11.5–15.5)
WBC: 6.1 10*3/uL (ref 4.0–10.5)

## 2014-04-05 LAB — TSH: TSH: 1.022 u[IU]/mL (ref 0.350–4.500)

## 2014-04-05 LAB — BASIC METABOLIC PANEL
BUN: 37 mg/dL — ABNORMAL HIGH (ref 6–23)
CALCIUM: 9.2 mg/dL (ref 8.4–10.5)
CO2: 27 mEq/L (ref 19–32)
CREATININE: 1.22 mg/dL — AB (ref 0.50–1.10)
Chloride: 104 mEq/L (ref 96–112)
GLUCOSE: 91 mg/dL (ref 70–99)
Potassium: 4.4 mEq/L (ref 3.5–5.3)
SODIUM: 139 meq/L (ref 135–145)

## 2014-04-05 LAB — T4, FREE: Free T4: 1.19 ng/dL (ref 0.80–1.80)

## 2014-04-05 NOTE — Telephone Encounter (Signed)
Pt coming in today

## 2014-04-05 NOTE — Progress Notes (Signed)
Subjective: Here for stomach issues.   She notes last night had bad episode of chest and abdomina pain, gas, relived with belching.  Takes her thyroid medication at bedtime on recommendation of Dr. Chalmers Cater in the past.  Last night within 30 minutes of taking thyroid pill, gets gassy in chest, hurst in jaws, stomach discomfort, burping, tinging across jaws.  After belching the symptoms resolve.    But this will recur intermittent throughout the day.   Has had this over the years, but worse, more frequent of the recent week.  Took 3 hours for symptoms to resolve last night.     I saw her a few months ago for chronic diarrhea, which seems to resolve some once she stopped drinking milk. Lately thought loose stool has started again.   She did have E. Coli exposure from her dog's ear infection months ago.  Can't seem to eat anything of late due to symptoms of pain, gas, belching, loose stool.  Denies SOB, wheezing leg edema.  Does note some weight loss recently. No other aggravating or relieving factors. No other complaint.  Review of Systems Constitutional: -fever, -chills, -sweats, +15lb weight loss in last 50mo,-fatigue ENT: -runny nose, -ear pain, -sore throat Cardiology:  +chest pain, -palpitations, -edema Respiratory: -cough, -shortness of breath, -wheezing Gastroenterology: +abdominal pain, -nausea, -vomiting, -diarrhea, -constipation  Hematology: -bleeding or bruising problems Musculoskeletal: +arthralgias, +myalgias, but these are not new, -joint swelling, -back pain Ophthalmology: -vision changes Urology: -dysuria, -difficulty urinating, -hematuria, -urinary frequency, -urgency Neurology: -headache, -weakness, -tingling, -numbness  Past Medical History  Diagnosis Date  . Osteoporosis   . Hashimoto's thyroiditis   . Multinodular goiter   . Osteoporosis   . Hashimoto's thyroiditis   . Multinodular goiter      Objective: BP 110/70 mmHg  Pulse 72  Temp(Src) 97.6 F (36.4 C) (Oral)   Resp 14  Wt 141 lb (63.957 kg)  LMP 02/23/1992  Wt Readings from Last 3 Encounters:  04/05/14 141 lb (63.957 kg)  01/04/14 145 lb (65.772 kg)  11/22/13 147 lb (66.679 kg)   General appearance: alert, no distress, WD/WN HEENT: normocephalic, sclerae anicteric, TMs pearly, nares patent, no discharge or erythema, pharynx normal Oral cavity: MMM, no lesions Neck: supple, no lymphadenopathy, no thyromegaly, no masses Heart: RRR, normal S1, S2, no murmurs Lungs: CTA bilaterally, no wheezes, rhonchi, or rales Abdomen: +bs, soft, non tender, non distended, no masses, no hepatomegaly, no splenomegaly Back: nontender Pulses: 2+ symmetric, upper and lower extremities, normal cap refill   Adult ECG Report  Indication: chest pain  Rate: 72 bpm  Rhythm: sinus rhythm, unusual P axis, ectopic atrial rhythm, occasional PVCs  QRS Axis: -59 degrees  PR Interval: 192ms  QRS Duration: 154ms  QTc: 475ms  Conduction Disturbances: incomplete RBBB, left anterior fascicular block, can't rule out anterior infarct  Other Abnormalities: none  Patient's cardiac risk factors are: advanced age (older than 43 for men, 70 for women) and sedentary lifestyle.  EKG comparison: 06/2010 EKG  Narrative Interpretation: no acute change, but abnormal EKG  Assessment: Encounter Diagnoses  Name Primary?  . Abdominal pain, unspecified abdominal location Yes  . Belching   . Other chest pain   . Hx of Hashimoto thyroiditis   . Thyroid goiter   . Lactose intolerance   . Loose stools   . Loss of weight     Plan:  we discussed her numerous symptoms.   We will refer to cardiology for baseline evaluation, likely referral to gastroenterology as well.  labs today including celiac screen and food allergy panel.   Over the weekend advised bland foods, avoid gluten and milk for now, and use some Pepto-Bismol over-the-counter.   Advise she switch her thyroid medication to morning 1 hour before breakfast.   Follow-up pending  referrals and labs

## 2014-04-05 NOTE — Telephone Encounter (Signed)
Pt had another bad attack with her stomach last night. The pains were bad for about 6 hours. Pt says she thinks it was gas because she started burping. Does Audelia Acton want to refer her to a GI doctor or does she need to be seen here again?

## 2014-04-05 NOTE — Telephone Encounter (Signed)
See if we can work her in today

## 2014-04-05 NOTE — Telephone Encounter (Signed)
Referral to cardiology for baseline eval and abnormal EKG

## 2014-04-08 LAB — ALLERGEN FOOD PROFILE SPECIFIC IGE
Apple: <0.1 kU/L
Chicken IgE: <0.1 kU/L
Fish Cod: <0.1 kU/L
IgE (Immunoglobulin E), Serum: 75 kU/L (ref <115)
Milk IgE: <0.1 kU/L
Orange: <0.1 kU/L
Shrimp IgE: <0.1 kU/L
Soybean IgE: <0.1 kU/L
Tuna IgE: <0.1 kU/L
Wheat IgE: <0.1 kU/L

## 2014-04-09 LAB — GLIA (IGA/G) + TTG IGA
GLIADIN IGG: 1 U (ref <20)
Gliadin IgA: 5 Units (ref <20)
Tissue Transglutaminase Ab, IgA: 1 U/mL (ref <4)

## 2014-04-10 NOTE — Telephone Encounter (Signed)
WORKING ON THIS

## 2014-04-11 ENCOUNTER — Other Ambulatory Visit: Payer: Self-pay | Admitting: Family Medicine

## 2014-04-11 DIAGNOSIS — R0789 Other chest pain: Secondary | ICD-10-CM

## 2014-04-18 ENCOUNTER — Telehealth: Payer: Self-pay | Admitting: Family Medicine

## 2014-04-18 DIAGNOSIS — R142 Eructation: Secondary | ICD-10-CM

## 2014-04-18 DIAGNOSIS — R1084 Generalized abdominal pain: Secondary | ICD-10-CM

## 2014-04-18 DIAGNOSIS — R195 Other fecal abnormalities: Secondary | ICD-10-CM

## 2014-04-18 DIAGNOSIS — R634 Abnormal weight loss: Secondary | ICD-10-CM

## 2014-04-18 NOTE — Telephone Encounter (Signed)
Patient called and said ok to refer her to GI anytime.  Pt ph [phone]

## 2014-04-22 ENCOUNTER — Telehealth: Payer: Self-pay | Admitting: Internal Medicine

## 2014-04-22 NOTE — Telephone Encounter (Signed)
error 

## 2014-04-22 NOTE — Telephone Encounter (Signed)
I have put a referral in to [facility] and they will contact her with appt

## 2014-04-22 NOTE — Telephone Encounter (Signed)
Got a message from [facility] that pt was dismissed in 2008 and can not get rescheduled. Called Dr. Mann/Dr. [facility] office and have to fax over the notes for review before they will let us know if one of the Doctors will see the pt since pt was dismissed from another GI office. Will fax over info

## 2014-04-24 DIAGNOSIS — R0789 Other chest pain: Secondary | ICD-10-CM | POA: Diagnosis not present

## 2014-04-24 DIAGNOSIS — E038 Other specified hypothyroidism: Secondary | ICD-10-CM | POA: Diagnosis not present

## 2014-04-24 DIAGNOSIS — I456 Pre-excitation syndrome: Secondary | ICD-10-CM | POA: Diagnosis not present

## 2014-04-24 DIAGNOSIS — E069 Thyroiditis, unspecified: Secondary | ICD-10-CM | POA: Diagnosis not present

## 2014-04-25 DIAGNOSIS — I456 Pre-excitation syndrome: Secondary | ICD-10-CM | POA: Diagnosis not present

## 2014-05-01 DIAGNOSIS — R002 Palpitations: Secondary | ICD-10-CM | POA: Diagnosis not present

## 2014-05-03 ENCOUNTER — Encounter: Payer: Self-pay | Admitting: Family Medicine

## 2014-05-07 ENCOUNTER — Other Ambulatory Visit: Payer: Self-pay | Admitting: Family Medicine

## 2014-05-10 ENCOUNTER — Other Ambulatory Visit: Payer: Self-pay | Admitting: Family Medicine

## 2014-05-24 DIAGNOSIS — I420 Dilated cardiomyopathy: Secondary | ICD-10-CM

## 2014-05-24 DIAGNOSIS — R931 Abnormal findings on diagnostic imaging of heart and coronary circulation: Secondary | ICD-10-CM

## 2014-05-24 HISTORY — DX: Dilated cardiomyopathy: I42.0

## 2014-05-24 HISTORY — DX: Abnormal findings on diagnostic imaging of heart and coronary circulation: R93.1

## 2014-05-30 DIAGNOSIS — R002 Palpitations: Secondary | ICD-10-CM | POA: Diagnosis not present

## 2014-06-13 ENCOUNTER — Encounter: Payer: Self-pay | Admitting: Family Medicine

## 2014-06-17 DIAGNOSIS — I420 Dilated cardiomyopathy: Secondary | ICD-10-CM | POA: Diagnosis not present

## 2014-06-17 DIAGNOSIS — R0789 Other chest pain: Secondary | ICD-10-CM | POA: Diagnosis not present

## 2014-06-17 DIAGNOSIS — I456 Pre-excitation syndrome: Secondary | ICD-10-CM | POA: Diagnosis not present

## 2014-06-18 ENCOUNTER — Telehealth: Payer: Self-pay | Admitting: Family Medicine

## 2014-06-18 NOTE — Telephone Encounter (Signed)
Medical records rcd [facility]

## 2014-08-19 ENCOUNTER — Other Ambulatory Visit: Payer: Self-pay

## 2014-09-23 DIAGNOSIS — Z1211 Encounter for screening for malignant neoplasm of colon: Secondary | ICD-10-CM

## 2014-09-23 HISTORY — DX: Encounter for screening for malignant neoplasm of colon: Z12.11

## 2014-09-30 ENCOUNTER — Other Ambulatory Visit: Payer: Self-pay | Admitting: Medical

## 2014-09-30 ENCOUNTER — Ambulatory Visit (INDEPENDENT_AMBULATORY_CARE_PROVIDER_SITE_OTHER): Payer: Medicare Other | Admitting: Medical

## 2014-09-30 ENCOUNTER — Encounter: Payer: Self-pay | Admitting: Medical

## 2014-09-30 ENCOUNTER — Telehealth: Payer: Self-pay | Admitting: Medical

## 2014-09-30 ENCOUNTER — Other Ambulatory Visit: Payer: Self-pay

## 2014-09-30 VITALS — BP 126/78 | HR 76 | Temp 97.6°F | Resp 20 | Ht 67.0 in | Wt 135.4 lb

## 2014-09-30 DIAGNOSIS — R5383 Other fatigue: Secondary | ICD-10-CM

## 2014-09-30 DIAGNOSIS — R634 Abnormal weight loss: Secondary | ICD-10-CM | POA: Diagnosis not present

## 2014-09-30 DIAGNOSIS — Z1239 Encounter for other screening for malignant neoplasm of breast: Secondary | ICD-10-CM | POA: Diagnosis not present

## 2014-09-30 DIAGNOSIS — E049 Nontoxic goiter, unspecified: Secondary | ICD-10-CM | POA: Diagnosis not present

## 2014-09-30 DIAGNOSIS — Z1231 Encounter for screening mammogram for malignant neoplasm of breast: Secondary | ICD-10-CM

## 2014-09-30 LAB — COMPREHENSIVE METABOLIC PANEL
ALBUMIN: 3.6 g/dL (ref 3.6–5.1)
ALT: 31 U/L — ABNORMAL HIGH (ref 6–29)
AST: 22 U/L (ref 10–35)
Alkaline Phosphatase: 93 U/L (ref 33–130)
BUN: 35 mg/dL — AB (ref 7–25)
CALCIUM: 9.4 mg/dL (ref 8.6–10.4)
CO2: 28 mmol/L (ref 20–31)
Chloride: 103 mmol/L (ref 98–110)
Creat: 1.2 mg/dL — ABNORMAL HIGH (ref 0.50–0.99)
Glucose, Bld: 84 mg/dL (ref 65–99)
Potassium: 4.7 mmol/L (ref 3.5–5.3)
Sodium: 142 mmol/L (ref 135–146)
Total Bilirubin: 0.5 mg/dL (ref 0.2–1.2)
Total Protein: 6.1 g/dL (ref 6.1–8.1)

## 2014-09-30 LAB — CBC WITH DIFFERENTIAL/PLATELET
BASOS PCT: 0 % (ref 0–1)
Basophils Absolute: 0 10*3/uL (ref 0.0–0.1)
EOS ABS: 0.1 10*3/uL (ref 0.0–0.7)
EOS PCT: 2 % (ref 0–5)
HEMATOCRIT: 43.3 % (ref 36.0–46.0)
HEMOGLOBIN: 14.6 g/dL (ref 12.0–15.0)
LYMPHS ABS: 1.2 10*3/uL (ref 0.7–4.0)
LYMPHS PCT: 20 % (ref 12–46)
MCH: 31 pg (ref 26.0–34.0)
MCHC: 33.7 g/dL (ref 30.0–36.0)
MCV: 91.9 fL (ref 78.0–100.0)
MPV: 9.8 fL (ref 8.6–12.4)
Monocytes Absolute: 0.4 10*3/uL (ref 0.1–1.0)
Monocytes Relative: 7 % (ref 3–12)
Neutro Abs: 4.3 10*3/uL (ref 1.7–7.7)
Neutrophils Relative %: 71 % (ref 43–77)
Platelets: 179 10*3/uL (ref 150–400)
RBC: 4.71 MIL/uL (ref 3.87–5.11)
RDW: 13 % (ref 11.5–15.5)
WBC: 6 10*3/uL (ref 4.0–10.5)

## 2014-09-30 LAB — T4, FREE: Free T4: 1.18 ng/dL (ref 0.80–1.80)

## 2014-09-30 LAB — TSH: TSH: 2.167 u[IU]/mL (ref 0.350–4.500)

## 2014-09-30 LAB — T3, FREE: T3, Free: 2.4 pg/mL (ref 2.3–4.2)

## 2014-09-30 NOTE — Telephone Encounter (Signed)
pls schedule thyroid ultrasound, due to goiter, thyroid asymmetry  pls set up mammogram.   Needs to be on a 1st floor, as she is scared of heights and elevators.  RE: screening mammogram

## 2014-09-30 NOTE — Progress Notes (Signed)
Subjective: Here with husband.  Worried about weight loss.   Feels like she has lost 30lb since last 56mo although our measurements here only show about 10 lb weight loss.  Feels hungry all the time, fatigued, but eats a lot.  Worried about thyroid being the cause.  Is compliant with medication levothyroxine.   Has used both generic and name brand synthroid prior.  Denies fevers but is cold all the time.  Gets flushing, menopausal.  No blood in stool.  No unusual bruising or bleeding.   She is not up to date on cancer screens.  Last mammogram 6 years ago, last pap probably 2+ years ago.   Last colonoscopy - never.   Since last visit never saw GI, thinks the referral slipped through the cracks.No other aggravating or relieving factors. No other complaint.  Past Medical History  Diagnosis Date  . Osteoporosis   . Hashimoto's thyroiditis   . Multinodular goiter   . Osteoporosis   . Hashimoto's thyroiditis   . Multinodular goiter     Family history  - brother with history of throat cancer  ROS as in subjective  Objective: BP 126/78 mmHg  Pulse 76  Temp(Src) 97.6 F (36.4 C) (Oral)  Resp 20  Ht 5\' 7"  (1.702 m)  Wt 135 lb 6.4 oz (61.417 kg)  BMI 21.20 kg/m2  LMP 02/23/1992    Wt Readings from Last 3 Encounters:  09/30/14 135 lb 6.4 oz (61.417 kg)  04/05/14 141 lb (63.957 kg)  01/04/14 145 lb (65.772 kg)   Gen: wdwn, nad Skin: oval 5cm x 3cm red brown rash of lower right para lumbar region, otherwise unremarkable HENT unremarkable Neck: left thyroid with fullness and asymmetry, but no distinct nodules, but otherwise no mass, no lymphadenotomy Lungs clear Heart RRR, normal s1 and s2, no murmurs Abdomen: +bs, soft, nonteder, no mass, no organomegaly Ext: no edema Pulses normal Neuro: nonfocal exam   Assessment: Encounter Diagnoses  Name Primary?  . Loss of weight Yes  . Other fatigue   . Thyroid goiter   . Screening for breast cancer     Plan: Labs today, set up for  thyroid ultrasound. Referral for mammogram.  Advised she return soon for breast and pelvic exam, pap.  Referral to GI.

## 2014-09-30 NOTE — Telephone Encounter (Signed)
Called pt to inform her of appointments 10/09/14 mammo arrive 10:45 and [facility]

## 2014-09-30 NOTE — Telephone Encounter (Signed)
Refer for Cologuard colon cancer screen

## 2014-10-01 NOTE — Telephone Encounter (Signed)
I HAVE FAXED IT

## 2014-10-08 DIAGNOSIS — Z1211 Encounter for screening for malignant neoplasm of colon: Secondary | ICD-10-CM | POA: Diagnosis not present

## 2014-10-08 DIAGNOSIS — Z1212 Encounter for screening for malignant neoplasm of rectum: Secondary | ICD-10-CM | POA: Diagnosis not present

## 2014-10-09 ENCOUNTER — Ambulatory Visit (HOSPITAL_COMMUNITY)
Admission: RE | Admit: 2014-10-09 | Discharge: 2014-10-09 | Disposition: A | Discharge status: Home / Self Care | Payer: Medicare Other | Source: Ambulatory Visit | Attending: Medical | Admitting: Medical

## 2014-10-09 DIAGNOSIS — Z1239 Encounter for other screening for malignant neoplasm of breast: Secondary | ICD-10-CM

## 2014-10-09 DIAGNOSIS — Z1231 Encounter for screening mammogram for malignant neoplasm of breast: Secondary | ICD-10-CM | POA: Insufficient documentation

## 2014-10-09 DIAGNOSIS — R5383 Other fatigue: Secondary | ICD-10-CM

## 2014-10-09 DIAGNOSIS — E063 Autoimmune thyroiditis: Secondary | ICD-10-CM | POA: Diagnosis not present

## 2014-10-09 DIAGNOSIS — E042 Nontoxic multinodular goiter: Secondary | ICD-10-CM | POA: Diagnosis not present

## 2014-10-09 DIAGNOSIS — R634 Abnormal weight loss: Secondary | ICD-10-CM

## 2014-10-09 DIAGNOSIS — E049 Nontoxic goiter, unspecified: Secondary | ICD-10-CM

## 2014-10-10 DIAGNOSIS — Z9289 Personal history of other medical treatment: Secondary | ICD-10-CM

## 2014-10-10 HISTORY — DX: Personal history of other medical treatment: Z92.89

## 2014-10-11 ENCOUNTER — Other Ambulatory Visit: Payer: Self-pay | Admitting: Family Medicine

## 2014-10-11 ENCOUNTER — Other Ambulatory Visit: Payer: Self-pay | Admitting: Medical

## 2014-10-11 DIAGNOSIS — E041 Nontoxic single thyroid nodule: Secondary | ICD-10-CM

## 2014-10-11 DIAGNOSIS — E042 Nontoxic multinodular goiter: Secondary | ICD-10-CM

## 2014-10-21 ENCOUNTER — Telehealth: Payer: Self-pay | Admitting: Medical

## 2014-10-21 NOTE — Telephone Encounter (Signed)
Please let her know that the Cologuard test was negative for cancer!  The minimal recommendation is repeat scan in 3 years. 

## 2014-10-22 ENCOUNTER — Other Ambulatory Visit (HOSPITAL_COMMUNITY)
Admission: RE | Admit: 2014-10-22 | Discharge: 2014-10-22 | Disposition: A | Discharge status: Home / Self Care | Payer: Medicare Other | Source: Ambulatory Visit | Attending: Medical | Admitting: Medical

## 2014-10-22 ENCOUNTER — Ambulatory Visit
Admission: RE | Admit: 2014-10-22 | Discharge: 2014-10-22 | Disposition: A | Discharge status: Home / Self Care | Payer: Medicare Other | Source: Ambulatory Visit | Attending: Medical | Admitting: Medical

## 2014-10-22 DIAGNOSIS — E041 Nontoxic single thyroid nodule: Secondary | ICD-10-CM

## 2014-10-22 DIAGNOSIS — E042 Nontoxic multinodular goiter: Secondary | ICD-10-CM

## 2014-10-22 HISTORY — DX: Nontoxic single thyroid nodule: E04.1

## 2014-10-22 NOTE — Telephone Encounter (Signed)
Pt informed word for word and verbalized understanding 

## 2014-10-22 NOTE — Procedures (Signed)
  US guided L thyroid nodule biopsy  4  25g needle sample 1  22 g Inrad needle sample  Pt tolerated well  Path pending

## 2014-10-23 DIAGNOSIS — I493 Ventricular premature depolarization: Secondary | ICD-10-CM | POA: Diagnosis not present

## 2014-10-23 DIAGNOSIS — I420 Dilated cardiomyopathy: Secondary | ICD-10-CM | POA: Diagnosis not present

## 2014-10-24 NOTE — Progress Notes (Signed)
Pt has been notified and has been scheduled to come back Monday Sept. 12

## 2014-11-01 ENCOUNTER — Encounter: Payer: Self-pay | Admitting: Medical

## 2014-11-04 ENCOUNTER — Encounter: Payer: Self-pay | Admitting: Medical

## 2014-11-04 ENCOUNTER — Ambulatory Visit (INDEPENDENT_AMBULATORY_CARE_PROVIDER_SITE_OTHER): Payer: Medicare Other | Admitting: Medical

## 2014-11-04 VITALS — BP 100/70 | HR 80 | Temp 98.1°F | Resp 18 | Wt 132.2 lb

## 2014-11-04 DIAGNOSIS — Z87891 Personal history of nicotine dependence: Secondary | ICD-10-CM | POA: Diagnosis not present

## 2014-11-04 DIAGNOSIS — M810 Age-related osteoporosis without current pathological fracture: Secondary | ICD-10-CM | POA: Diagnosis not present

## 2014-11-04 DIAGNOSIS — E041 Nontoxic single thyroid nodule: Secondary | ICD-10-CM | POA: Insufficient documentation

## 2014-11-04 DIAGNOSIS — E039 Hypothyroidism, unspecified: Secondary | ICD-10-CM | POA: Insufficient documentation

## 2014-11-04 DIAGNOSIS — K529 Noninfective gastroenteritis and colitis, unspecified: Secondary | ICD-10-CM | POA: Diagnosis not present

## 2014-11-04 DIAGNOSIS — K9049 Malabsorption due to intolerance, not elsewhere classified: Secondary | ICD-10-CM

## 2014-11-04 DIAGNOSIS — I420 Dilated cardiomyopathy: Secondary | ICD-10-CM

## 2014-11-04 DIAGNOSIS — K904 Malabsorption due to intolerance, not elsewhere classified: Secondary | ICD-10-CM

## 2014-11-04 DIAGNOSIS — R634 Abnormal weight loss: Secondary | ICD-10-CM | POA: Diagnosis not present

## 2014-11-04 NOTE — Patient Instructions (Signed)
Encounter Diagnoses  Name Primary?  . Loss of weight Yes  . Hypothyroidism, unspecified hypothyroidism type   . Thyroid nodule   . Chronic diarrhea   . Former smoker   . Milk intolerance   . Osteoporosis   . Dilated cardiomyopathy     Recommendations:  Please call Dr. Woody Seller' office to ask for some clarification on the status of your heart condition and ask which type of nasal sprays you can and can't use  Check your weights weekly.   If you lose another 3-5 lbs in the next few weeks, then we need to go further with evaluation  We have examined your thyroid, and things seem to be ok with the thyroid for now  I recommend you return for pap smear and pelvic exam with me or with Vickie here  You also should qualify for lung cancer screening chest CT given your history of tobacco use.   Please call your insurance to see what the costs would be out of pocket  If you continue to lose weight, we will recommend CT chest, abdomen, and pelvis  Consider Soy Milk or use Lactaid supplements OTC for digestion if you are consuming dairy products.

## 2014-11-04 NOTE — Progress Notes (Signed)
Subjective: Chief Complaint  Patient presents with  . Follow-up    Lab results   Here for f/u.   Accompanied by husband.   Feeling ok since last visit other than recent cold.  Thinks she had virus after last visit.  Since last visit she denies any current symptoms unless she drinks milk.  She drinks it as she likes the taste, but she knows it gives her diarrhea.   At her recent visit she was worried about weight loss.  She reported losing close to 30lb in the past year, although our records up until 09/30/14 showed 10 lb weight loss over the same time frame.   At last visit in August she reported feeling hungry all the time, fatigue, although she is eating a lot.   She was worried about the thyroid but that has now checked out ok on labs and thyroid biopsy recently.  Her mammogram is now up to date.   She is here for additional eval today.  Denies fevers but is cold all the time.  Gets flushing, menopausal.  No blood in stool.  No unusual bruising or bleeding.  She is compliant with her thyroid medication.  She is a former smoker, smoked 30 years+, but 1.5 ppd for many years, and smoked up to 3ppd by the time she quit.  Quit 1994.  No other aggravating or relieving factors. No other complaint.  Past Medical History  Diagnosis Date  . Dilated cardiomyopathy 05/2014    Dr. Woody Seller  . Echocardiogram abnormal 05/2014    mild dilation of Left Ventricle; Dr. Woody Seller  . Hashimoto's thyroiditis   . Multinodular goiter   . Colon cancer screening 09/2014    Cologuard test negative   . History of mammogram 10/10/14    normal  . Thyroid nodule 0/37/04    benign follicular nodule on biopsy  . Renal insufficiency   . Osteoporosis     Family history  - brother with history of throat cancer  ROS as in subjective  Objective: BP 100/70 mmHg  Pulse 80  Temp(Src) 98.1 F (36.7 C) (Oral)  Resp 18  Wt 132 lb 3.2 oz (59.966 kg)  LMP 02/23/1992    Wt Readings from Last 3 Encounters:  11/04/14 132 lb 3.2  oz (59.966 kg)  09/30/14 135 lb 6.4 oz (61.417 kg)  04/05/14 141 lb (63.957 kg)   Gen: wdwn, nad Skin: oval 5cm x 3cm red brown rash of lower right para lumbar region, otherwise unremarkable HENT with nasal congestion, otherwise unremarkable Oral: dentures present Neck: left thyroid with fullness and asymmetry, but no distinct nodules, but otherwise no mass, no lymphadenotomy Lungs clear Heart RRR, normal s1 and s2, no murmurs Abdomen: +bs, soft, nontender, no mass, no organomegaly Ext: no edema Pulses normal Neuro: nonfocal exam   Assessment: Encounter Diagnoses  Name Primary?  . Loss of weight Yes  . Hypothyroidism, unspecified hypothyroidism type   . Thyroid nodule   . Chronic diarrhea   . Former smoker   . Milk intolerance   . Osteoporosis   . Dilated cardiomyopathy      Plan: Since last visit mammogram 10/10/14 was normal, Cologuard test was negative for colorectal cancer screen (she declined colonoscopy), 10/22/14 thyroid nodule biopsy showed benign follicular nodule, and labs from 09/30/14 showed normal CBC and thyroid labs, she has mild renal insufficiency but its stable, and ALT was slightly elevated.  Rest of lytes were normal.  She declines any eval today.  Recommended  pap, pelvic, serum allergy panel, CT lung cancer screen and possibly CT abd/pelvis if she continue to lose weight.    I reviewed her April cardiology notes.  She is not compliant with Lisinopril and she just saw Dr. Woody Seller last week.  Discussed the need for compliance with Lisinopril.  Recommendations:  Please call Dr. Woody Seller' office to ask for some clarification on the status of your heart condition and ask which type of nasal sprays you can and can't use  Check your weights weekly.   If you lose another 3-5 lbs in the next few weeks, then we need to go further with evaluation  We have examined your thyroid, and things seem to be ok with the thyroid for now  I recommend you return for pap smear and  pelvic exam with me or with Vickie here  You also should qualify for lung cancer screening chest CT given your history of tobacco use.   Please call your insurance to see what the costs would be out of pocket  If you continue to lose weight, we will recommend CT chest, abdomen, and pelvis  Consider Soy Milk or use Lactaid supplements OTC for digestion if you are consuming dairy products.  We will await her next move since she dec liens any other intervention today.   I counseled on diet and avoiding dairy for the time being since this gives her GI problems apparently.  Next visit need to address osteoporosis.

## 2014-11-08 ENCOUNTER — Encounter: Payer: Self-pay | Admitting: Family Medicine

## 2014-11-08 ENCOUNTER — Ambulatory Visit (INDEPENDENT_AMBULATORY_CARE_PROVIDER_SITE_OTHER): Payer: Medicare Other | Admitting: Family Medicine

## 2014-11-08 ENCOUNTER — Other Ambulatory Visit (HOSPITAL_COMMUNITY)
Admission: RE | Admit: 2014-11-08 | Discharge: 2014-11-08 | Disposition: A | Discharge status: Home / Self Care | Payer: Medicare Other | Source: Ambulatory Visit | Attending: Family Medicine | Admitting: Family Medicine

## 2014-11-08 VITALS — BP 130/80 | HR 64 | Wt 130.6 lb

## 2014-11-08 DIAGNOSIS — Z124 Encounter for screening for malignant neoplasm of cervix: Secondary | ICD-10-CM | POA: Diagnosis not present

## 2014-11-08 DIAGNOSIS — Z1151 Encounter for screening for human papillomavirus (HPV): Secondary | ICD-10-CM | POA: Diagnosis not present

## 2014-11-08 NOTE — Addendum Note (Signed)
Addended by: Girtha Rm on: 11/08/2014 01:28 PM   Modules accepted: Orders

## 2014-11-08 NOTE — Progress Notes (Signed)
   Subjective:    Patient ID: Rita Wright, female    DOB: 06-05-1950, 64 y.o.   MRN: [id number]  HPI She is here for a Pap smear and pelvic exam. She had a recent physical exam with Audelia Acton, PA and decided to schedule with me for Pap smear. Denies fever, chills, abdominal pain, vaginal discharge or bleeding, odor, or irritation. Denies need for STI screening. She does report a recent weight loss and states she is getting getting checked head to toe to try to find a reason for her weight loss.   Reviewed past medical history and social history.   Review of Systems Pertinent positives and negatives in the history of present illness.    Objective:   Physical Exam  Constitutional: She appears well-developed. No distress.  Genitourinary: There is no rash, tenderness or lesion on the right labia. There is no rash, tenderness or lesion on the left labia. Uterus is enlarged. Uterus is not tender. Right adnexum displays no mass and no tenderness. Left adnexum displays no mass and no tenderness. No erythema, tenderness or bleeding in the vagina. No vaginal discharge found.  Darkened spots noted to opening of cervical os with possible polyp. Hard fixed mass palpated on manual exam of uterus, non tender.           Assessment & Plan:  Screening for cervical cancer - Plan: Cytology - PAP  Pap smear and HPV testing completed and results pending. Discussed patient's abnormal appearing cervix and pelvic exam with Audelia Acton, [place], her PCP. Recommend based on findings to refer her to gynecology for further evaluation.

## 2014-11-12 LAB — CYTOLOGY - PAP

## 2014-11-22 ENCOUNTER — Other Ambulatory Visit: Payer: Self-pay | Admitting: Family Medicine

## 2015-01-30 DIAGNOSIS — D3131 Benign neoplasm of right choroid: Secondary | ICD-10-CM | POA: Diagnosis not present

## 2015-01-30 DIAGNOSIS — H2513 Age-related nuclear cataract, bilateral: Secondary | ICD-10-CM | POA: Diagnosis not present

## 2015-05-07 DIAGNOSIS — I493 Ventricular premature depolarization: Secondary | ICD-10-CM | POA: Diagnosis not present

## 2015-05-07 DIAGNOSIS — I420 Dilated cardiomyopathy: Secondary | ICD-10-CM | POA: Diagnosis not present

## 2015-05-17 ENCOUNTER — Other Ambulatory Visit: Payer: Self-pay | Admitting: Family Medicine

## 2015-07-08 ENCOUNTER — Encounter: Payer: Self-pay | Admitting: Medical

## 2015-07-08 ENCOUNTER — Ambulatory Visit (INDEPENDENT_AMBULATORY_CARE_PROVIDER_SITE_OTHER): Payer: Medicare Other | Admitting: Medical

## 2015-07-08 VITALS — BP 120/72 | HR 76 | Temp 97.8°F | Resp 16 | Wt 137.0 lb

## 2015-07-08 DIAGNOSIS — J0110 Acute frontal sinusitis, unspecified: Secondary | ICD-10-CM | POA: Diagnosis not present

## 2015-07-08 DIAGNOSIS — H109 Unspecified conjunctivitis: Secondary | ICD-10-CM | POA: Diagnosis not present

## 2015-07-08 MED ORDER — LEVOFLOXACIN 500 MG PO TABS: 500.0000 mg | ORAL_TABLET | Freq: Every day | ORAL | Status: DC

## 2015-07-08 NOTE — Progress Notes (Signed)
Subjective: Chief Complaint  Patient presents with  . Ear Pain    and goes down her neck. sore throat. head is hurting. said she had a temp of 100.5. tried to take flonase and it made it worse   Here for respiratory infection.  Has had sick contacts.   She notes over a week hx/o illness. Had some nausea, vomiting, and diarrhea, but this lasted 2 days.  Has had ongoing head congestion, ear pain, sore throat, headache, fever.    Used some Flonase.  Neck has been sore.   Has had a lot of cough. Left eye has been watery, now both eyes tearing.  No matted or goupy eyes.   Currently taking some tylenol.  No other aggravating or relieving factors. No other complaint.   Past Medical History  Diagnosis Date  . Dilated cardiomyopathy ([place]) 05/2014    Dr. Woody Seller  . Echocardiogram abnormal 05/2014    mild dilation of Left Ventricle; Dr. Woody Seller  . Hashimoto's thyroiditis   . Multinodular goiter   . Colon cancer screening 09/2014    Cologuard test negative   . History of mammogram 10/10/14    normal  . Thyroid nodule [id number]    benign follicular nodule on biopsy  . Renal insufficiency   . Osteoporosis    ROS as in subjective   Objective: BP 120/72 mmHg  Pulse 76  Temp(Src) 97.8 F (36.6 C) (Tympanic)  Resp 16  Wt 137 lb (62.143 kg)  LMP 02/23/1992   General appearance: Alert, WD/WN, no distress                             Skin: warm, no rash                           Head: + mild frontal sinus tenderness,                            Eyes: conjunctiva of left eye injected, right fine, lower eye lids a little puffy, corneas clear, PERRLA                            Ears: flat TMs, external ear canals normal                          Nose: septum midline, turbinates swollen, with erythema and no discharge             Mouth/throat: MMM, tongue normal, mild pharyngeal erythema                           Neck: supple, no adenopathy, no thyromegaly mild anterior tenderness  Heart: RRR, normal S1, S2, no murmurs                         Lungs: CTA bilaterally, no wheezes, rales, or rhonchi         Assessment: Encounter Diagnoses  Name Primary?  . Acute frontal sinusitis, recurrence not specified Yes  . Bilateral conjunctivitis       Plan: Begin Levaquin, rest, hydrate well, can use OTC allergy eye drop.  If matted or goupy eye, call back.  In general can use Claritin OTC for allergies but avoid decongestants.  F/u prn

## 2015-08-13 ENCOUNTER — Ambulatory Visit (INDEPENDENT_AMBULATORY_CARE_PROVIDER_SITE_OTHER): Payer: Medicare Other | Admitting: Medical

## 2015-08-13 ENCOUNTER — Encounter: Payer: Self-pay | Admitting: Medical

## 2015-08-13 VITALS — BP 100/70 | HR 70 | Wt 137.0 lb

## 2015-08-13 DIAGNOSIS — L259 Unspecified contact dermatitis, unspecified cause: Secondary | ICD-10-CM | POA: Diagnosis not present

## 2015-08-13 DIAGNOSIS — L255 Unspecified contact dermatitis due to plants, except food: Secondary | ICD-10-CM

## 2015-08-13 MED ORDER — SULFACETAMIDE-PREDNISOLONE 10-0.23 % OP SOLN: 1.0000 [drp] | OPHTHALMIC | Status: DC

## 2015-08-13 NOTE — Progress Notes (Signed)
Subjective:   Rita Wright is a 65 y.o. female who presents for evaluation of a rash involving the right eye lid, chin, right face.  Concerned for poison ivy as they have recent exposure - was pulling weeds with her bare hands and ended up rubbing her face.   Rash started 3 days ago.  Rash is itchy, red, raised.  Patient denies: fever, joint aches, chills, nausea. Patient has not had contacts with similar rash.   Using OTC Hydrocortisone cream for the rash.  No other aggravating or relieving factors.  No other c/o.   The following portions of the patient's history were reviewed and updated as appropriate: allergies, current medications, past family history, past medical history, past social history and problem list.  Review of Systems As in subjective above   Objective:   Gen: wd, wn, nad Skin:  Right eye lid puffy and pink/red upper, pink puffy similar swollen skin of chin, right face and right lateral to nose.   Eyes: PERRLA, EOMi, no obvious rash of eye   Assessment:   Encounter Diagnoses  Name Primary?  . Contact dermatitis Yes  . Plant dermatitis       Plan:   Discussed symptoms and exam findings, diagnosis, treatment options.  Etiology appears to be poison ivy dermatitis.  Begin eye drop below, can use OTC Hydrocortisone sparingly on the affected areas of the face except avoid getting it in the eye.  Discussed washing contaminated clothing on the hot cycle in the washing machine, washing gloves, shoes, or other utensils in hot soapy water.  Avoid re- exposure.  Discussed signs of infection or worsening symptoms that would prompt recheck.  Advised OTC Benadryl QHS the next few days  Follow up prn, or if not much improved in the next 4-5 days.  Rita Wright was seen today for poison ivy.  Diagnoses and all orders for this visit:  Contact dermatitis  Plant dermatitis  Other orders -     sulfacetamide-prednisoLONE (VASOCIDIN) 10-0.23 % ophthalmic solution; Place 1 drop into the  right eye every 3 (three) hours while awake.

## 2015-10-20 ENCOUNTER — Other Ambulatory Visit: Payer: Self-pay

## 2015-11-11 ENCOUNTER — Other Ambulatory Visit: Payer: Self-pay | Admitting: Medical

## 2015-12-02 DIAGNOSIS — Z23 Encounter for immunization: Secondary | ICD-10-CM | POA: Diagnosis not present

## 2016-01-29 DIAGNOSIS — D3131 Benign neoplasm of right choroid: Secondary | ICD-10-CM | POA: Diagnosis not present

## 2016-01-29 DIAGNOSIS — H2513 Age-related nuclear cataract, bilateral: Secondary | ICD-10-CM | POA: Diagnosis not present

## 2016-02-06 ENCOUNTER — Other Ambulatory Visit: Payer: Self-pay | Admitting: Medical

## 2016-05-04 ENCOUNTER — Other Ambulatory Visit: Payer: Self-pay | Admitting: Family Medicine

## 2016-05-05 ENCOUNTER — Other Ambulatory Visit: Payer: Self-pay | Admitting: Medical

## 2016-05-06 ENCOUNTER — Ambulatory Visit (INDEPENDENT_AMBULATORY_CARE_PROVIDER_SITE_OTHER): Payer: Medicare Other | Admitting: Medical

## 2016-05-06 ENCOUNTER — Encounter: Payer: Self-pay | Admitting: Medical

## 2016-05-06 VITALS — BP 124/70 | HR 59 | Wt 147.0 lb

## 2016-05-06 DIAGNOSIS — Z7189 Other specified counseling: Secondary | ICD-10-CM | POA: Diagnosis not present

## 2016-05-06 DIAGNOSIS — Z9114 Patient's other noncompliance with medication regimen: Secondary | ICD-10-CM | POA: Diagnosis not present

## 2016-05-06 DIAGNOSIS — M810 Age-related osteoporosis without current pathological fracture: Secondary | ICD-10-CM

## 2016-05-06 DIAGNOSIS — Z780 Asymptomatic menopausal state: Secondary | ICD-10-CM | POA: Diagnosis not present

## 2016-05-06 DIAGNOSIS — Z7185 Encounter for immunization safety counseling: Secondary | ICD-10-CM | POA: Insufficient documentation

## 2016-05-06 DIAGNOSIS — E039 Hypothyroidism, unspecified: Secondary | ICD-10-CM | POA: Diagnosis not present

## 2016-05-06 DIAGNOSIS — F339 Major depressive disorder, recurrent, unspecified: Secondary | ICD-10-CM | POA: Diagnosis not present

## 2016-05-06 DIAGNOSIS — E2839 Other primary ovarian failure: Secondary | ICD-10-CM

## 2016-05-06 DIAGNOSIS — Z91148 Patient's other noncompliance with medication regimen for other reason: Secondary | ICD-10-CM | POA: Insufficient documentation

## 2016-05-06 LAB — CBC
HCT: 46.2 % — ABNORMAL HIGH (ref 35.0–45.0)
Hemoglobin: 14.9 g/dL (ref 11.7–15.5)
MCH: 30.5 pg (ref 27.0–33.0)
MCHC: 32.3 g/dL (ref 32.0–36.0)
MCV: 94.5 fL (ref 80.0–100.0)
MPV: 10.6 fL (ref 7.5–12.5)
PLATELETS: 154 10*3/uL (ref 140–400)
RBC: 4.89 MIL/uL (ref 3.80–5.10)
RDW: 12.7 % (ref 11.0–15.0)
WBC: 4.3 10*3/uL (ref 4.0–10.5)

## 2016-05-06 LAB — COMPREHENSIVE METABOLIC PANEL
ALT: 30 U/L — ABNORMAL HIGH (ref 6–29)
AST: 24 U/L (ref 10–35)
Albumin: 3.7 g/dL (ref 3.6–5.1)
Alkaline Phosphatase: 81 U/L (ref 33–130)
BUN: 35 mg/dL — AB (ref 7–25)
CALCIUM: 9.4 mg/dL (ref 8.6–10.4)
CHLORIDE: 104 mmol/L (ref 98–110)
CO2: 29 mmol/L (ref 20–31)
Creat: 1.06 mg/dL — ABNORMAL HIGH (ref 0.50–0.99)
Glucose, Bld: 60 mg/dL — ABNORMAL LOW (ref 65–99)
POTASSIUM: 4.7 mmol/L (ref 3.5–5.3)
Sodium: 141 mmol/L (ref 135–146)
TOTAL PROTEIN: 6.2 g/dL (ref 6.1–8.1)
Total Bilirubin: 0.7 mg/dL (ref 0.2–1.2)

## 2016-05-06 LAB — TSH: TSH: 3.64 m[IU]/L

## 2016-05-06 LAB — T4, FREE: Free T4: 1.2 ng/dL (ref 0.8–1.8)

## 2016-05-06 NOTE — Progress Notes (Signed)
Subjective Chief Complaint  Patient presents with  . med check    med check   Accompanied by her husband today  Medical team: Has seen Dr. Woody Seller cardiology in the past [place], [place], PA-C here for primary care Sees Harland Dingwall, NP here with per my request for gyn visit since she prefers female provider  Has hx/o marginal dilated cardiomyopathy, and upon last visit with Dr. Woody Seller, she says he told her she didn't have to come back unless there was problems  Taking OTC vit D daily , folic acid daily, probiotic daily  Hypothyroidism - Taking generic Levothyroxine 175mcg daily. Does better on generic Levothyroxine than than the brand Synthroid.   Osteoporosis - in the past had lots of pains with Boniva.  Has been reluctant to try other medications.  Takes Vitamin D for breast and bone health. Takes 400 u daily  Does fine with milk ,but can't tolerate butter.   She notes mood sometimes down, denies HI/SI.  Declines counseling, medication, just notes her son is having problems and she worries about him.  No other aggravating or relieving factors. No other complaint.  Past Medical History:  Diagnosis Date  . Colon cancer screening 09/2014   Cologuard test negative   . Dilated cardiomyopathy ([place]) 05/2014   Dr. Woody Seller  . Echocardiogram abnormal 05/2014   mild dilation of Left Ventricle; Dr. Woody Seller  . Hashimoto's thyroiditis   . History of mammogram 10/10/14   normal  . Multinodular goiter   . Osteoporosis   . Renal insufficiency   . Thyroid nodule 1/61/09   benign follicular nodule on biopsy   Review of Systems Constitutional: -fever, -chills, -sweats, -unexpected weight change,-fatigue ENT: -runny nose, -ear pain, -sore throat Cardiology:  -chest pain, -palpitations, -edema Respiratory: -cough, -shortness of breath, -wheezing Gastroenterology: -abdominal pain, -nausea, -vomiting, -diarrhea, -constipation Hematology: -bleeding or bruising problems Musculoskeletal:  -arthralgias, -myalgias, -joint swelling, -back pain Ophthalmology: -vision changes Urology: -dysuria, -difficulty urinating, -hematuria, -urinary frequency, -urgency Neurology: -headache, -weakness, -tingling, -numbness    Objective: BP 124/70   Pulse (!) 59   Wt 147 lb (66.7 kg)   LMP 02/23/1992   SpO2 98%   BMI 23.02 kg/m   General appearance: alert, no distress, WD/WN, white female HEENT: normocephalic, sclerae anicteric, TMs pearly, nares patent, no discharge or erythema, pharynx normal Oral cavity: MMM, no lesions Neck: supple, no lymphadenopathy, no thyromegaly, no masses, no bruits Heart: RRR, normal S1, S2, no murmurs Lungs: CTA bilaterally, no wheezes, rhonchi, or rales Ext: no edema, no calve tenderness or varicosities Pulses: 2+ symmetric, upper and lower extremities, normal cap refill    Assessment: Encounter Diagnoses  Name Primary?  . Hypothyroidism, unspecified type Yes  . Osteoporosis, unspecified osteoporosis type, unspecified pathological fracture presence   . Depression, recurrent ([place])   . Vaccine counseling   . Post-menopausal   . Estrogen deficiency   . Noncompliance with medication regimen      Plan: Hypothyroidism - does fine on generic levothyroxine.   Labs today, c/t same medication  Osteoporosis - discussed risks of the disease, discussed treatment.  She will c/t to get adequate Vit D and calcium. I recommended Prolia since she refuses bisphosphonate's such as bonvia, fosamax, re clast.  She will consider and let me know  Depression - declines counseling, medication.  Discussed her mood, treatment options.  F/u prn.  Vaccine counseling: She will check insurance coverage for pneumococcal and shingles vaccines.  Vit D lab today given osteoporosis, estrogen deficiency, post  menopausal  She is somewhat noncompliant.  In the past has failed to come in for physical, refused treatment for osteoporosis other than Vit D, and declines vaccines  today that were advised.  Mckinzy was seen today for med check.  Diagnoses and all orders for this visit:  Hypothyroidism, unspecified type -     Comprehensive metabolic panel -     TSH -     T4, free -     CBC -     VITAMIN D 25 Hydroxy (Vit-D Deficiency, Fractures)  Osteoporosis, unspecified osteoporosis type, unspecified pathological fracture presence -     CBC -     VITAMIN D 25 Hydroxy (Vit-D Deficiency, Fractures)  Depression, recurrent ([facility]) -     Comprehensive metabolic panel -     TSH -     T4, free -     CBC -     VITAMIN D 25 Hydroxy (Vit-D Deficiency, Fractures)  Vaccine counseling  Post-menopausal -     CBC -     VITAMIN D 25 Hydroxy (Vit-D Deficiency, Fractures)  Estrogen deficiency -     CBC -     VITAMIN D 25 Hydroxy (Vit-D Deficiency, Fractures)  Noncompliance with medication regimen

## 2016-05-07 ENCOUNTER — Other Ambulatory Visit: Payer: Self-pay | Admitting: Medical

## 2016-05-07 LAB — VITAMIN D 25 HYDROXY (VIT D DEFICIENCY, FRACTURES): Vit D, 25-Hydroxy: 44 ng/mL (ref 30–100)

## 2016-07-06 ENCOUNTER — Ambulatory Visit (INDEPENDENT_AMBULATORY_CARE_PROVIDER_SITE_OTHER): Payer: Medicare Other | Admitting: Medical

## 2016-07-06 ENCOUNTER — Telehealth: Payer: Self-pay | Admitting: Medical

## 2016-07-06 VITALS — BP 116/68 | HR 63 | Temp 97.8°F | Wt 151.0 lb

## 2016-07-06 DIAGNOSIS — E2839 Other primary ovarian failure: Secondary | ICD-10-CM

## 2016-07-06 DIAGNOSIS — K9049 Malabsorption due to intolerance, not elsewhere classified: Secondary | ICD-10-CM | POA: Diagnosis not present

## 2016-07-06 DIAGNOSIS — R238 Other skin changes: Secondary | ICD-10-CM

## 2016-07-06 DIAGNOSIS — W57XXXA Bitten or stung by nonvenomous insect and other nonvenomous arthropods, initial encounter: Secondary | ICD-10-CM

## 2016-07-06 DIAGNOSIS — E162 Hypoglycemia, unspecified: Secondary | ICD-10-CM

## 2016-07-06 DIAGNOSIS — M810 Age-related osteoporosis without current pathological fracture: Secondary | ICD-10-CM

## 2016-07-06 DIAGNOSIS — T148XXA Other injury of unspecified body region, initial encounter: Secondary | ICD-10-CM

## 2016-07-06 DIAGNOSIS — E039 Hypothyroidism, unspecified: Secondary | ICD-10-CM | POA: Diagnosis not present

## 2016-07-06 NOTE — Telephone Encounter (Signed)
pls call and give her info on going for repeat bone density scan.  Also, at her convenience she can come by for fating labs at 8:30am.  I put labs in computer.  This is lab to check certain causes of hypoglyecmia.

## 2016-07-06 NOTE — Progress Notes (Signed)
Subjective: Chief Complaint  Patient presents with  . Insect Bite    tick bite  on foot in between toes ,itching , redness , x1 week    Here for tick bite.   Accompanied by husband.  She found a tick between between right 3rd and 4th toes maybe a week ago.  He had bitten but wasn't engorged.  Doesn't think the tick had been on there that long.  She pulled off the tick.  It left a red place that has been itchy and it has seeped some.  Denies fever, body aches, chills, sweats, headaches. No other aggravating or relieving factors.   She also has questions about her labs from last visit.  Still hasn't heard back about doing bone density scan.  No other complaint.  Past Medical History:  Diagnosis Date  . Colon cancer screening 09/2014   Cologuard test negative   . Dilated cardiomyopathy ([place]) 05/2014   Dr. Woody Seller  . Echocardiogram abnormal 05/2014   mild dilation of Left Ventricle; Dr. Woody Seller  . Hashimoto's thyroiditis   . History of mammogram 10/10/14   normal  . Multinodular goiter   . Osteoporosis   . Renal insufficiency   . Thyroid nodule 05/17/69   benign follicular nodule on biopsy   Current Outpatient Prescriptions on File Prior to Visit  Medication Sig Dispense Refill  . cholecalciferol (VITAMIN D) 400 UNITS TABS Take 400 Units by mouth.    . folic acid (FOLVITE) 1 MG tablet TAKE 1 TABLET BY MOUTH ONCE A DAY 90 tablet 2  . Lactobacillus (PROBIOTIC ACIDOPHILUS PO) Take by mouth.    . levothyroxine (SYNTHROID, LEVOTHROID) 100 MCG tablet TAKE 1 TABLET BY MOUTH EVERY DAY BEFORE BREAKFAST 90 tablet 0   No current facility-administered medications on file prior to visit.    ROS as in subjective  Objective: BP 116/68   Pulse 63   Temp 97.8 F (36.6 C)   Wt 151 lb (68.5 kg)   LMP 02/23/1992   SpO2 98%   BMI 23.65 kg/m   General appearance: alert, no distress, WD/WN,  HEENT: normocephalic, sclerae anicteric, TMs pearly, nares patent, no discharge or erythema, pharynx  normal Oral cavity: MMM, no lesions Neck: supple, no lymphadenopathy, no thyromegaly, no masses Heart: RRR, normal S1, S2, no murmurs Lungs: CTA bilaterally, no wheezes, rhonchi, or rales Abdomen: +bs, soft, non tender, non distended, no masses, no hepatomegaly, no splenomegaly Pulses: 2+ symmetric, upper and lower extremities, normal cap refill Right foot between 3rd and 4rd toe with small wound, with erythema, suggestive of where she recently pulled off some skin when she removed a tick    Assessment: Encounter Diagnoses  Name Primary?  . Tick bite, initial encounter Yes  . Wound of skin   . Hypoglycemia   . Estrogen deficiency   . Hypothyroidism, unspecified type   . Osteoporosis, unspecified osteoporosis type, unspecified pathological fracture presence   . Milk intolerance     Plan: Tick bite - discussed her symptoms, findings, and reassured.   Use soap and water hygiene, discussed wound care, c/t the bacitracin ointment she is already using.   Discussed symptoms and signs of lyme, RMSF and other tick borne illness that would prompt recheck. No sign of these infections today.  hypoglycemia - return for fasting morning labs  Gave info to go for bone density scan given hx/o osteoporosis, hypothyroidism, estrogen deficiency  Cinthia was seen today for insect bite.  Diagnoses and all orders for this visit:  Tick bite, initial encounter  Wound of skin  Hypoglycemia -     Insulin, random; Future -     Cortisol; Future -     Prolactin; Future  Estrogen deficiency -     DG Bone Density; Future  Hypothyroidism, unspecified type -     Insulin, random; Future -     Cortisol; Future -     Prolactin; Future -     DG Bone Density; Future  Osteoporosis, unspecified osteoporosis type, unspecified pathological fracture presence -     DG Bone Density; Future  Milk intolerance -     DG Bone Density; Future

## 2016-07-06 NOTE — Telephone Encounter (Signed)
Called and notified pt of this she is going to call [facility] to get this set up and she will call us back about the lab work because her daughter in training she is babysitting her  Children.   I told her to call us back to set up an nurse visit appt. And make sure she is fasting

## 2016-07-31 ENCOUNTER — Other Ambulatory Visit: Payer: Self-pay | Admitting: Medical

## 2016-09-01 ENCOUNTER — Ambulatory Visit (INDEPENDENT_AMBULATORY_CARE_PROVIDER_SITE_OTHER): Payer: Medicare Other | Admitting: Medical

## 2016-09-01 ENCOUNTER — Encounter: Payer: Self-pay | Admitting: Medical

## 2016-09-01 VITALS — BP 106/68 | HR 60 | Ht 66.0 in | Wt 148.4 lb

## 2016-09-01 DIAGNOSIS — Z780 Asymptomatic menopausal state: Secondary | ICD-10-CM | POA: Diagnosis not present

## 2016-09-01 DIAGNOSIS — R634 Abnormal weight loss: Secondary | ICD-10-CM | POA: Diagnosis not present

## 2016-09-01 DIAGNOSIS — Z1231 Encounter for screening mammogram for malignant neoplasm of breast: Secondary | ICD-10-CM | POA: Diagnosis not present

## 2016-09-01 DIAGNOSIS — M810 Age-related osteoporosis without current pathological fracture: Secondary | ICD-10-CM | POA: Diagnosis not present

## 2016-09-01 DIAGNOSIS — K529 Noninfective gastroenteritis and colitis, unspecified: Secondary | ICD-10-CM | POA: Diagnosis not present

## 2016-09-01 DIAGNOSIS — Z23 Encounter for immunization: Secondary | ICD-10-CM

## 2016-09-01 DIAGNOSIS — E041 Nontoxic single thyroid nodule: Secondary | ICD-10-CM | POA: Diagnosis not present

## 2016-09-01 DIAGNOSIS — I420 Dilated cardiomyopathy: Secondary | ICD-10-CM

## 2016-09-01 DIAGNOSIS — Z0000 Encounter for general adult medical examination without abnormal findings: Secondary | ICD-10-CM | POA: Diagnosis not present

## 2016-09-01 DIAGNOSIS — E2839 Other primary ovarian failure: Secondary | ICD-10-CM | POA: Diagnosis not present

## 2016-09-01 DIAGNOSIS — E039 Hypothyroidism, unspecified: Secondary | ICD-10-CM

## 2016-09-01 DIAGNOSIS — E162 Hypoglycemia, unspecified: Secondary | ICD-10-CM | POA: Insufficient documentation

## 2016-09-01 DIAGNOSIS — K9049 Malabsorption due to intolerance, not elsewhere classified: Secondary | ICD-10-CM | POA: Diagnosis not present

## 2016-09-01 DIAGNOSIS — Z7189 Other specified counseling: Secondary | ICD-10-CM

## 2016-09-01 DIAGNOSIS — I428 Other cardiomyopathies: Secondary | ICD-10-CM | POA: Insufficient documentation

## 2016-09-01 DIAGNOSIS — F410 Panic disorder [episodic paroxysmal anxiety] without agoraphobia: Secondary | ICD-10-CM | POA: Diagnosis not present

## 2016-09-01 DIAGNOSIS — K219 Gastro-esophageal reflux disease without esophagitis: Secondary | ICD-10-CM | POA: Diagnosis not present

## 2016-09-01 DIAGNOSIS — Z7185 Encounter for immunization safety counseling: Secondary | ICD-10-CM

## 2016-09-01 DIAGNOSIS — Z1239 Encounter for other screening for malignant neoplasm of breast: Secondary | ICD-10-CM | POA: Insufficient documentation

## 2016-09-01 DIAGNOSIS — I429 Cardiomyopathy, unspecified: Secondary | ICD-10-CM | POA: Insufficient documentation

## 2016-09-01 LAB — CBC
HEMATOCRIT: 48.5 % — AB (ref 35.0–45.0)
Hemoglobin: 15.9 g/dL — ABNORMAL HIGH (ref 11.7–15.5)
MCH: 31.2 pg (ref 27.0–33.0)
MCHC: 32.8 g/dL (ref 32.0–36.0)
MCV: 95.3 fL (ref 80.0–100.0)
MPV: 11 fL (ref 7.5–12.5)
Platelets: 170 10*3/uL (ref 140–400)
RBC: 5.09 MIL/uL (ref 3.80–5.10)
RDW: 12.5 % (ref 11.0–15.0)
WBC: 5 10*3/uL (ref 4.0–10.5)

## 2016-09-01 LAB — LIPID PANEL
CHOL/HDL RATIO: 2.5 ratio (ref <5.0)
Cholesterol: 231 mg/dL — ABNORMAL HIGH (ref <200)
HDL: 93 mg/dL (ref >50)
LDL CALC: 126 mg/dL — AB (ref <100)
TRIGLYCERIDES: 62 mg/dL (ref <150)
VLDL: 12 mg/dL (ref <30)

## 2016-09-01 LAB — TSH: TSH: 3.47 mIU/L

## 2016-09-01 LAB — COMPREHENSIVE METABOLIC PANEL
ALBUMIN: 3.9 g/dL (ref 3.6–5.1)
ALT: 29 U/L (ref 6–29)
AST: 23 U/L (ref 10–35)
Alkaline Phosphatase: 77 U/L (ref 33–130)
BUN: 38 mg/dL — ABNORMAL HIGH (ref 7–25)
CHLORIDE: 105 mmol/L (ref 98–110)
CO2: 26 mmol/L (ref 20–31)
Calcium: 9.2 mg/dL (ref 8.6–10.4)
Creat: 0.87 mg/dL (ref 0.50–0.99)
Glucose, Bld: 78 mg/dL (ref 65–99)
POTASSIUM: 4.5 mmol/L (ref 3.5–5.3)
Sodium: 141 mmol/L (ref 135–146)
Total Bilirubin: 0.7 mg/dL (ref 0.2–1.2)
Total Protein: 6.5 g/dL (ref 6.1–8.1)

## 2016-09-01 NOTE — Progress Notes (Signed)
BP 106/68   Pulse 60   Ht 5\' 6"  (1.676 m)   Wt 148 lb 6.4 oz (67.3 kg)   LMP 02/23/1992   SpO2 97%   BMI 23.95 kg/m   Wt Readings from Last 3 Encounters:  09/01/16 148 lb 6.4 oz (67.3 kg)  07/06/16 151 lb (68.5 kg)  05/06/16 147 lb (66.7 kg)

## 2016-09-01 NOTE — Patient Instructions (Signed)
MEDICARE PREVENTATIVE SERVICES (FEMALE) AND PERSONALIZED PLAN for Rita Wright September 01, 2016  Thank you for trusting [place] with your health care.   Here is a summary and plan from your visit.  Your list of diagnoses today/Problem List: Encounter Diagnoses  Name Primary?  . Medicare annual wellness visit, subsequent Yes  . Hypothyroidism, unspecified type   . Gastroesophageal reflux disease without esophagitis   . Chronic diarrhea   . Osteoporosis, unspecified osteoporosis type, unspecified pathological fracture presence   . Loss of weight   . Hypoglycemia   . Screening mammogram, encounter for   . Estrogen deficiency   . Thyroid nodule   . Milk intolerance   . PANIC DISORDER   . Post-menopausal   . Vaccine counseling      Cancer Screening Colorectal cancer screening:  Your last cologuard colon cancer screen was 2016.  You will need a repeat cologuard next year.  Breast cancer screening/Mammogram: schedule a mammogram and bone density scan at this time.  Pelvic exam: This is recommended at least every 2 years  Osteoporosis Screening Bone Density Screening for bone health: Call and schedule a bone density scan   Cardiovascular Screening Blood pressure:    Last EKG :2016   Abdominal Aortic Aneurysm Screen You do not qualify due to nonsmoker.   Diabetes Screening Labs today   Weight Screen/BMI Screen  Wt Readings from Last 3 Encounters:  09/01/16 148 lb 6.4 oz (67.3 kg)  07/06/16 151 lb (68.5 kg)  05/06/16 147 lb (66.7 kg)    Your height to weight ratio is Body mass index is 23.95 kg/m.  Reference Range:  Underweight: BMI less than 18.5.  Normal weight: BMI between 18.5 and 24.9.  Overweight: BMI between 25 and 29.9.  Obese: BMI of 30 and above.   Your listed Vaccines/Immunizations: Immunization History  Administered Date(s) Administered  . Influenza Split 12/28/2010  . Influenza Whole 11/14/2009  . Influenza, Seasonal, Injecte, Preservative  Fre 03/03/2012  . Influenza,inj,Quad PF,36+ Mos 11/17/2012, 11/22/2013  . Influenza-Unspecified 12/02/2015  . Tdap 08/01/2008      GENERAL RECOMMENDATIONS FOR GOOD HEALTH:  Supplements:  . Take a daily baby Aspirin 81mg  at bedtime for heart health unless you have a history of gastrointestinal bleed, allergy to aspirin, or are already taking higher dose Aspirin or other antiplatelet or blood thinner medication.   . Consume 1200 mg of Calcium daily through dietary calcium or supplement if you are female age 68 or older, or men 26 and older.   Men aged 60-70 should consume 1000 mg of Calcium daily. . Take 600 IU of Vitamin D daily.  Take 800 IU of Calcium daily if you are older than age 68.  . Take a general multivitamin daily.   Healthy diet: Eat a variety of foods, including fruits, vegetables, vegetable protein such as beans, lentils, tofu, and grains, such as rice.  Limit meat or animal protein, but if you eat meat, choose leans cuts such as chicken, fish, or [place].  Drink plenty of water daily.  Decrease saturated fat in the diet, avoid lots of red meat, processed foods, sweets, fast foods, and fried foods.  Limit salt and caffeine intake.  Exercise: Aerobic exercise helps maintain good heart health. Weight bearing exercise helps keep bones and muscles working strong.  We recommend at least 30-40 minutes of exercise most days of the week.   Fall prevention: Falls are the leading cause of injuries, accidents, and accidental deaths in people over the age of  68. Falling is a real threat to your ability to live on your own.  Causes include poor eyesight or poor hearing, illness, poor lighting, throw rugs, clutter in your home, and medication side effects causing dizziness or balance problems.  Such medications can include medications for depression, sleep problems, high blood pressure, diabetes, and heart conditions.   PREVENTION  Be sure your home is as safe as possible. Here are some  tips:  Wear shoes with non-skid soles (not house slippers).   Be sure your home and outside area are well lit.   Use night lights throughout your house, including hallways and stairways.   Remove clutter and clean up spills on floors and walkways.   Remove throw rugs or fasten them to the floor with carpet tape. Tack down carpet edges.   Do not place electrical cords across pathways.   Install grab bars in your bathtub, shower, and toilet area. Towel bars should not be used as a grab bar.   Install handrails on both sides of stairways.   Do not climb on stools or stepladders. Get someone else to help with jobs that require climbing.   Do not wax your floors at all, or use a non-skid wax.   Repair uneven or unsafe sidewalks, walkways or stairs.   Keep frequently used items within reach.   Be aware of pets so you do not trip.  Get regular check-ups from your doctor, and take good care of yourself:  Have your eyes checked every year for vision changes, cataracts, glaucoma, and other eye problems. Wear eyeglasses as directed.   Have your hearing checked every 2 years, or anytime you or others think that you cannot hear well. Use hearing aids as directed.   See your caregiver if you have foot pain or corns. Sore feet can contribute to falls.   Let your caregiver know if a medicine is making you feel dizzy or making you lose your balance.   Use a cane, walker, or wheelchair as directed. Use walker or wheelchair brakes when getting in and out.   When you get up from bed, sit on the side of the bed for 1 to 2 minutes before you stand up. This will give your blood pressure time to adjust, and you will feel less dizzy.   If you need to go to the bathroom often, consider using a bedside commode.  Disease prevention:  If you smoke or chew tobacco, find out from your caregiver how to quit. It can literally save your life, no matter how long you have been a tobacco user. If you do not  use tobacco, never begin. Medicare does cover some smoking cessation counseling.  Maintain a healthy diet and normal weight. Increased weight leads to problems with blood pressure and diabetes. We check your height, weight, and BMI as part of your yearly visit.  The Body Mass Index or BMI is a way of measuring how much of your body is fat. Having a BMI above 27 increases the risk of heart disease, diabetes, hypertension, stroke and other problems related to obesity. Your caregiver can help determine your BMI and based on it develop an exercise and dietary program to help you achieve or maintain this important measurement at a healthful level.  High blood pressure causes heart and blood vessel problems.  Persistent high blood pressure should be treated with medicine if weight loss and exercise do not work.  We check your blood pressure as part of your yearly  visit.  Avoid drinking alcohol in excess (more than two drinks per day).  Avoid use of street drugs. Do not share needles with anyone. Ask for professional help if you need assistance or instructions on stopping the use of alcohol, cigarettes, and/or drugs.  Brush your teeth twice a day with fluoride toothpaste, and floss once a day. Good oral hygiene prevents tooth decay and gum disease. The problems can be painful, unattractive, and can cause other health problems. Visit your dentist for a routine oral and dental checkup and preventive care every 6-12 months.   See your eye doctor yearly for routine screening for things like glaucoma.  Look at your skin regularly.  Use a mirror to look at your back. Notify your caregivers of changes in moles, especially if there are changes in shapes, colors, a size larger than a pencil eraser, an irregular border, or development of new moles.  Safety:  Use seatbelts 100% of the time, whether driving or as a passenger.  Use safety devices such as hearing protection if you work in environments with loud noise  or significant background noise.  Use safety glasses when doing any work that could send debris in to the eyes.  Use a helmet if you ride a bike or motorcycle.  Use appropriate safety gear for contact sports.  Talk to your caregiver about gun safety.  Use sunscreen with a SPF (or skin protection factor) of 15 or greater.  Lighter skinned people are at a greater risk of skin cancer. Don't forget to also wear sunglasses in order to protect your eyes from too much damaging sunlight. Damaging sunlight can accelerate cataract formation.   If you have multiple sexual partners, or if you are not in a monogamous relationship, practice safe sex. Use condoms. Condoms are used to help reduce the spread of sexually transmitted infections (or STIs).  Consider an HIV test if you have never been tested.  Consider routine screening for STIs if you have multiple sexual partners.   Keep carbon monoxide and smoke detectors in your home functioning at all times. Change the batteries every 6 months or use a model that plugs into the wall or is hard wired in.   END OF LIFE PLANNING/ADVANCED DIRECTIVES Advance health-care planning is deciding the kind of care you want at the end of life. While alert competent adults are able to exercise their rights to make health care and financial decisions, problems arise when an individual becomes unconscious, incapacitated, or otherwise unable to communicate or make such decisions. Advance health care directives are the legal documents in which you give written instructions about your choices limited, aggressive or palliative care if, in the future, you cannot speak for yourself.  Advanced directives include the following: [place] allows you to appoint someone to act as your health care agent to make health care decisions for you should it be determined by your health care provider that you are no longer able to make these decisions for yourself.  A Living Will is  a legal document in which you can declare that under certain conditions you desire your life not be prolonged by extraordinary or artificial means during your last illness or when you are near death. We can provide you with sample advanced directives, you can get an attorney to prepare these for you, or you can visit [place] Secretary of State's website for additional information and resources at [email or web address]  Further, I recommend you have an attorney prepare  a Will and Durable Power of Attorney if you haven't done so already.  Please get [place] a copy of your health care Advanced Directives.   PREVENTATIV E CARE RECOMMENDATIONS:  Vaccinations: We recommend the following vaccinations as part of your preventative care:  Pneumococcal vaccine is recommended to protect against certain types of pneumonia.  This is normally recommended for adults age 56 or older once, or up to every 5 years for those at high risk.  The vaccine is also recommended for adults younger than 68 years old with certain underlying conditions that make them high risk for pneumonia.  Influenza vaccine is recommended to protect against seasonal influenza or "the flu." Influenza is a serious disease that can lead to hospitalization and sometimes even death. Traditional flu vaccines (called trivalent vaccines) are made to protect against three flu viruses; an influenza A (H1N1) virus, an influenza A (H3N2) virus, and an influenza B virus. In addition, there are flu vaccines made to protect against four flu viruses (called "quadrivalent" vaccines). These vaccines protect against the same viruses as the trivalent vaccine and an additional B virus.  We recommend the high dose influenza vaccine to those 65 years and older.  Hepatitis B vaccine to protect against a form of infection of the liver by a virus acquired from blood or body fluids, particularly for high risk groups.  Td or Tdap vaccine to protect against  Tetanus, diphtheria and pertussis which can be very serious.  These diseases are caused by bacteria.  Diphtheria and pertussis are spread from person to person through coughing or sneezing.  Tetanus enters the body through cuts, scratches, or wounds.  Tetanus (Lockjaw) causes painful muscle tightening and stiffness, usually all over the body.  Diphtheria can cause a thick coating to form in the back of the throat.  It can lead to breathing problems, paralysis, heart failure, and death.  Pertussis (Whooping Cough) causes severe coughing spells, which can cause difficulty breathing, vomiting and disturbed sleep.  Td or Tdap is usually given every 10 years.  Shingles vaccine to protect against Varicella Zoster if you are older than age 61, or younger than 68 years old with certain underlying illness.    Cancer Screening: Most routine colon cancer screening begins at the age of 65.  Subsequent colonoscopies are performed either every 5-10 years for normal screening, or every 2-5 years for higher risks patients, up until age 22 years of age. Annual screening is done with easy to use take-home tests to check for hidden blood in the stool called hemoccult tests.  Sigmoidoscopy or colonoscopy can detect the earliest forms of colon cancer and is life saving. These tests use a small camera at the end of a tube to directly examine the colon.   Pelvic Exam and Pap Smear: Pelvic exams and pap smears are performed routinely to evaluate for abnormalities as well as cancers including cervical and vaginal cancers.  This is generally performed every 2-3 years for most women, or more frequently for higher risk patients.  Mammograms: Mammograms are used to screen for breast cancer.  Medicare covers baseline screening once from ages 64-75 years old, but will cover mammograms yearly for those 40 years and older.  In accordance with other guidelines, you may not need a mammogram every year though.  The decision on how  frequently you need a mammogram should be discussed with you medical provider.    Osteoporosis Screening: Screening for osteoporosis usually begins at age 29 for women, and can  be done as frequent as every 2 years.  However, women or men with higher risk of osteoporosis may be screened earlier than age 45.  Osteoporosis or low bone mass is diminished bone strength from alterations in bone architecture leading to bone fragility and increased fracture risk.     Cardiovascular Screening: Fat and cholesterol leaves deposits in your arteries that can block them. This causes heart disease and vessel disease elsewhere in your body.  If your cholesterol is found to be high, or if you have heart disease or certain other medical conditions, then you may need to have your cholesterol monitored frequently and be treated with medication. Cardiovascular screening in the form of lab tests for cholesterol, HDL and triglycerides can be done every 5 years.  A screening electrocardiogram can be done as part of the Welcome to Medicare physical.  Diabetes Screening: Diabetes screening can be done at least every 3 years for those with risk factors,  or every 6-67months for prediabetic patients.  Screening includes fasting blood sugar test or glucose tolerance test.  Risk factors include hypertension, dyslipidemia, obesity, previously abnormal glucose tests, family history of diabetes, age 4 years or older, and history of gestations diabetes.   AAA (abdominal aortic aneurysm) Screening: Medicare allows for a one time ultrasound to screen for abdominal aortic aneurysm if done as a referral as part of the Welcome to Medicare exam.  Men eligible for this screening include those men between age 37-68 years of age who have smoked at least 100 cigarettes in his lifetime and/or has a family history of AAA.  HIV Screening:  Medicare allows for yearly screening for patients at high risk for contracting HIV disease.

## 2016-09-01 NOTE — Progress Notes (Signed)
Subjective:    Rita Wright is a 66 y.o. female who presents for Preventative Services visit and chronic medical problems/med check visit.    Primary Care Provider Aisling Emigh, Camelia Eng, PA-C here for primary care  Current Health Care Team:  Dentist, eye doctor  Dr. Woody Seller, cardiology  Harland Dingwall, NP here for pap smear (female requested) but I am still her PCP  Medical Services you may have received from other than [facility] providers in the past year (date may be approximate) none  Exercise Current exercise habits: gardening, walking   Nutrition/Diet Current diet: in general, a "healthy" diet  , but eats meat every meal  Depression Screen Depression screen [facility] 2/9 09/01/2016  Decreased Interest 0  Down, Depressed, Hopeless 0  PHQ - 2 Score 0    Activities of Daily Living Screen/Functional Status Survey Is the patient deaf or have difficulty hearing?: No Does the patient have difficulty seeing, even when wearing glasses/contacts?: No Does the patient have difficulty concentrating, remembering, or making decisions?: Yes (some memory issues ) Does the patient have difficulty walking or climbing stairs?: No Does the patient have difficulty dressing or bathing?: No Does the patient have difficulty doing errands alone such as visiting a doctor's office or shopping?: (S) Yes (she doesn't have a driver lis,)  Can patient draw a clock face showing 3:15 o'clock, yes  Fall Risk Screen Fall Risk  09/01/2016 10/20/2015 04/05/2014  Falls in the past year? No No No    Gait Assessment: Normal gait observed yes   Past Medical History:  Diagnosis Date  . Colon cancer screening 09/2014   Cologuard test negative   . Dilated cardiomyopathy ([place]) 05/2014   Dr. Woody Seller  . Echocardiogram abnormal 05/2014   mild dilation of Left Ventricle; Dr. Woody Seller  . Hashimoto's thyroiditis   . History of mammogram 10/10/14   normal  . Multinodular goiter   . Osteoporosis   . Renal insufficiency   . Thyroid  nodule 5/99/35   benign follicular nodule on biopsy    Past Surgical History:  Procedure Laterality Date  . COLONOSCOPY  2014   cologuard 2016    Social History   Social History  . Marital status: Married    Spouse name: N/A  . Number of children: N/A  . Years of education: N/A   Occupational History  . Not on file.   Social History Main Topics  . Smoking status: Former Smoker    Types: Cigarettes    Quit date: 02/23/1992  . Smokeless tobacco: Never Used  . Alcohol use No  . Drug use: No  . Sexual activity: Not Currently   Other Topics Concern  . Not on file   Social History Narrative   Married.  Exercise with walking her dog, gardening.       No family history on file.   Current Outpatient Prescriptions:  .  cholecalciferol (VITAMIN D) 400 UNITS TABS, Take 400 Units by mouth., Disp: , Rfl:  .  folic acid (FOLVITE) 1 MG tablet, TAKE 1 TABLET BY MOUTH ONCE A DAY, Disp: 90 tablet, Rfl: 2 .  Lactobacillus (PROBIOTIC ACIDOPHILUS PO), Take by mouth., Disp: , Rfl:  .  levothyroxine (SYNTHROID, LEVOTHROID) 100 MCG tablet, TAKE 1 TABLET BY MOUTH EVERY DAY BEFORE BREAKFAST, Disp: 90 tablet, Rfl: 0  Allergies  Allergen Reactions  . Boniva [Ibandronic Acid]     Horrible joint pains  . Erythromycin     GI upset  . Prednisone Other (See Comments)  Jittery, insomnia, intolerance  . Penicillins Hives, Rash and Other (See Comments)    Felt like she was going to pass out.    History reviewed: allergies, current medications, past family history, past medical history, past social history, past surgical history and problem list  Chronic issues discussed: Hypothyroidism - compliant with medication  Cardiomyopathy - c/t routine exercise, healthy diet.   No other c/o  Acute issues discussed: none  Objective:      Biometrics BP 106/68   Pulse 60   Ht 5\' 6"  (1.676 m)   Wt 148 lb 6.4 oz (67.3 kg)   LMP 02/23/1992   SpO2 97%   BMI 23.95 kg/m   Cognitive  Testing  Alert? Yes  Normal Appearance?Yes  Oriented to person? Yes  Place? Yes   Time? Yes  Recall of three objects?  Yes  Can perform simple calculations? Yes  Displays appropriate judgment?Yes  Can read the correct time from a watch face?Yes  General appearance: alert, no distress, WD/WN, white female  Nutritional Status: Inadequate calore intake? no Loss of muscle mass? no Loss of fat beneath skin? no Localized or general edema? no Diminished functional status? no  Other pertinent exam: HEENT: normocephalic, sclerae anicteric, TMs pearly, nares patent, no discharge or erythema, pharynx normal Oral cavity: MMM, no lesions, upper denture Neck: supple, no lymphadenopathy, no thyromegaly, no masses Heart: RRR, normal S1, S2, no murmurs Lungs: CTA bilaterally, no wheezes, rhonchi, or rales Abdomen: +bs, soft, non tender, non distended, no masses, no hepatomegaly, no splenomegaly Musculoskeletal: nontender, no swelling, no obvious deformity Extremities: no edema, no cyanosis, no clubbing Pulses: 2+ symmetric, upper and lower extremities, normal cap refill Neurological: alert, oriented x 3, CN2-12 intact, strength normal upper extremities and lower extremities, sensation normal throughout, DTRs 2+ throughout, no cerebellar signs, gait normal Psychiatric: normal affect, behavior normal, pleasant    Assessment:   Encounter Diagnoses  Name Primary?  . Medicare annual wellness visit, subsequent Yes  . Hypothyroidism, unspecified type   . Gastroesophageal reflux disease without esophagitis   . Chronic diarrhea   . Osteoporosis, unspecified osteoporosis type, unspecified pathological fracture presence   . Loss of weight   . Hypoglycemia   . Screening mammogram, encounter for   . Estrogen deficiency   . Thyroid nodule   . Milk intolerance   . PANIC DISORDER   . Post-menopausal   . Vaccine counseling   . Dilated cardiomyopathy ([place])   . Need for pneumococcal vaccination       Plan:   A preventative services visit was completed today.  During the course of the visit today, we discussed and counseled about appropriate screening and preventive services.  A health risk assessment was established today that included a review of current medications, allergies, social history, family history, medical and preventative health history, biometrics, and preventative screenings to identify potential safety concerns or impairments.  A personalized plan was printed today for your records and use.   Personalized health advice and education was given today to reduce health risks and promote self management and wellness.  Information regarding end of life planning was discussed today.  Conditions/risks identified: No new conditions  Chronic problems discussed today: Hypothyroidism- labs today, c/t same medications hypoglycemia - lab today Dilated cardiomyopathy  - f/u with cardiology yearly   Acute problems discussed today: none  Recommendations:  I recommend a yearly ophthalmology/optometry visit for glaucoma screening and eye checkup  I recommended a yearly dental visit for hygiene and checkup  Advanced directives - discussed nature and purpose of Advanced Directives, encouraged them to complete them if they have not done so and/or encouraged them to get [place] a copy if they have done this already.  I recommend a screening mammogram every 1-2 years  2016 cologuard negative reviewed  She will schedule mammogram and bone density  She is up to date with normal pap 2016.   Referrals today: none  Immunizations: I recommended a yearly influenza vaccine, typically in September when the vaccine is usually available Counseled on pneumococcal, Tdap and shingles vaccines.  Counseled on the pneumococcal vaccine.  Vaccine information sheet given.  Pneumococcal vaccine Prevnar 13 given after consent obtained.   Avangelina was seen today for [employer].  Diagnoses  and all orders for this visit:  Medicare annual wellness visit, subsequent  Hypothyroidism, unspecified type -     Comprehensive metabolic panel -     CBC -     Lipid panel -     TSH -     VITAMIN D 25 Hydroxy (Vit-D Deficiency, Fractures) -     Insulin, random -     Cortisol -     Prolactin  Gastroesophageal reflux disease without esophagitis -     Comprehensive metabolic panel -     CBC -     Lipid panel -     TSH -     VITAMIN D 25 Hydroxy (Vit-D Deficiency, Fractures)  Chronic diarrhea -     Comprehensive metabolic panel -     CBC -     Lipid panel -     TSH -     VITAMIN D 25 Hydroxy (Vit-D Deficiency, Fractures)  Osteoporosis, unspecified osteoporosis type, unspecified pathological fracture presence -     Comprehensive metabolic panel -     CBC -     Lipid panel -     TSH -     VITAMIN D 25 Hydroxy (Vit-D Deficiency, Fractures) -     DG BONE DENSITY (DXA); Future  Loss of weight -     Comprehensive metabolic panel -     CBC -     Lipid panel -     TSH -     VITAMIN D 25 Hydroxy (Vit-D Deficiency, Fractures)  Hypoglycemia -     Insulin, random -     Cortisol -     Prolactin  Screening mammogram, encounter for -     MM DIGITAL SCREENING BILATERAL; Future  Estrogen deficiency -     DG BONE DENSITY (DXA); Future  Thyroid nodule -     DG BONE DENSITY (DXA); Future  Milk intolerance  PANIC DISORDER  Post-menopausal -     DG BONE DENSITY (DXA); Future  Vaccine counseling  Dilated cardiomyopathy ([place])  Need for pneumococcal vaccination -     Pneumococcal conjugate vaccine 13-valent    Medicare Attestation A preventative services visit was completed today.  During the course of the visit the patient was educated and counseled about appropriate screening and preventive services.  A health risk assessment was established with the patient that included a review of current medications, allergies, social history, family history, medical and  preventative health history, biometrics, and preventative screenings to identify potential safety concerns or impairments.  A personalized plan was printed today for the patient's records and use.   Personalized health advice and education was given today to reduce health risks and promote self management and wellness.  Information regarding end of life  planning was discussed today.  Crisoforo Oxford, PA-C   09/01/2016

## 2016-09-01 NOTE — Addendum Note (Signed)
Addended by: Tyrone Apple on: 09/01/2016 09:29 AM   Modules accepted: Orders

## 2016-09-02 ENCOUNTER — Other Ambulatory Visit: Payer: Self-pay | Admitting: Medical

## 2016-09-02 LAB — CORTISOL: Cortisol, Plasma: 19.5 ug/dL

## 2016-09-02 LAB — VITAMIN D 25 HYDROXY (VIT D DEFICIENCY, FRACTURES): Vit D, 25-Hydroxy: 42 ng/mL (ref 30–100)

## 2016-09-02 LAB — INSULIN, RANDOM: Insulin: 8.6 u[IU]/mL (ref 2.0–19.6)

## 2016-09-02 LAB — PROLACTIN: Prolactin: 6 ng/mL

## 2016-09-02 MED ORDER — PRAVASTATIN SODIUM 20 MG PO TABS: 20.0000 mg | ORAL_TABLET | Freq: Every day | ORAL | 1 refills | Status: DC

## 2016-09-02 MED ORDER — LEVOTHYROXINE SODIUM 100 MCG PO TABS: ORAL_TABLET | ORAL | 3 refills | Status: DC

## 2016-09-09 ENCOUNTER — Ambulatory Visit (INDEPENDENT_AMBULATORY_CARE_PROVIDER_SITE_OTHER): Payer: Medicare Other | Admitting: Family Medicine

## 2016-09-09 ENCOUNTER — Encounter: Payer: Self-pay | Admitting: Family Medicine

## 2016-09-09 VITALS — BP 120/70 | HR 62 | Temp 98.6°F | Wt 151.6 lb

## 2016-09-09 DIAGNOSIS — R21 Rash and other nonspecific skin eruption: Secondary | ICD-10-CM

## 2016-09-09 NOTE — Patient Instructions (Signed)
Try over the counter hydrocortisone and use cool compresses to the area. This will help with itching and inflammation.  You can take Benadryl at bedtime if needed. Tylenol also if needed.   If you notice any worsening or new symptoms then call or return.

## 2016-09-09 NOTE — Progress Notes (Signed)
   Subjective:    Patient ID: Rita Wright, female    DOB: 1950/02/28, 66 y.o.   MRN: [id number]  HPI Chief Complaint  Patient presents with  . left arm red    left arm red and swollen. had pneumonia shot july 11th and was sore every since. but woke up yesteday morning with itching and red and swollen   She is here with complaints of left upper arm that is itching, hot, burning since yesterday morning.  Sates her left upper arm was swollen this morning but the swelling has gone down. She suspects this is related to an insect bite but did not see one. Reports overall her symptoms have improved since yesterday. Denies pain in her arm.   She has not taken any medication today. States she took tylenol yesterday.   Denies fever, chills, numbness, tingling, weakness, nausea, vomiting, diarrhea. No other rashes. No joint or muscle pain.   States she got a pneumonia injection in the same arm last week on the 11th. States she had some mild soreness after the injection but no redness, itching or swelling. States the injection site no longer bothers her.   Reviewed allergies, medications, past medical, surgical, and social history.    Review of Systems Pertinent positives and negatives in the history of present illness.     Objective:   Physical Exam  Constitutional: She is oriented to person, place, and time. She appears well-developed and well-nourished. No distress.  HENT:  Mouth/Throat: Oropharynx is clear and moist.  Neck: Normal range of motion. Neck supple.  Musculoskeletal: Normal range of motion.  Neurological: She is alert and oriented to person, place, and time.  Skin: Skin is warm and dry. Rash noted.     Local urticarial type rash to left anterior upper arm without warmth, edema or tenderness. Skin intact.  No induration. Normal sensation, cap refill, pulses, ROM and strength in LUE compared to RUE.  No other rash.    BP 120/70   Pulse 62   Temp 98.6 F (37 C) (Oral)    Wt 151 lb 9.6 oz (68.8 kg)   LMP 02/23/1992   BMI 24.47 kg/m       Assessment & Plan:  Localized rash  Discussed that she appears to be having a localized reaction to something. LUE is neurovascularly intact. Unlikely that this is from the pneumonia injection she received 8 days ago. Reportedly, her arm is much better compared to 2 hours ago and continues to improve. She will try OTC hydrocortisone, declines prescription for triamcinolone. Recommend using cool compresses for itching and Benadryl if needed.  She will call or return if any new or worsening symptoms appear.

## 2016-10-28 ENCOUNTER — Other Ambulatory Visit: Payer: Self-pay | Admitting: Medical

## 2016-11-27 DIAGNOSIS — Z23 Encounter for immunization: Secondary | ICD-10-CM | POA: Diagnosis not present

## 2016-12-04 IMAGING — MG MM DIGITAL SCREENING BILAT
7 series · 7 of 7 positions shown · non-contrast
Comparison: None.

CLINICAL DATA: Screening.

EXAM:
DIGITAL SCREENING BILATERAL MAMMOGRAM WITH CAD

[R CC (1 of 2)]
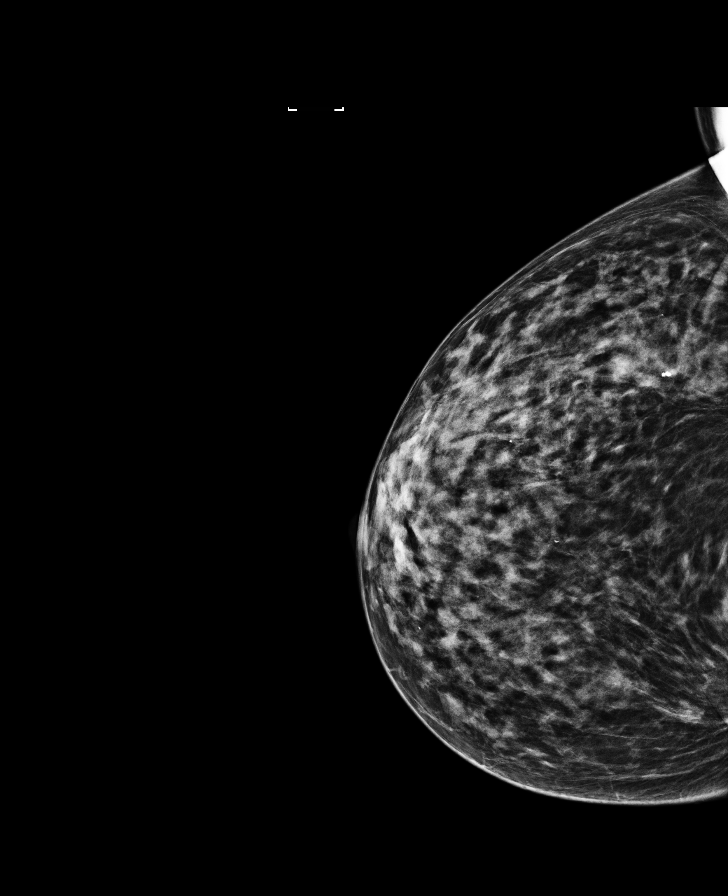

[L MLO (1 of 2)]
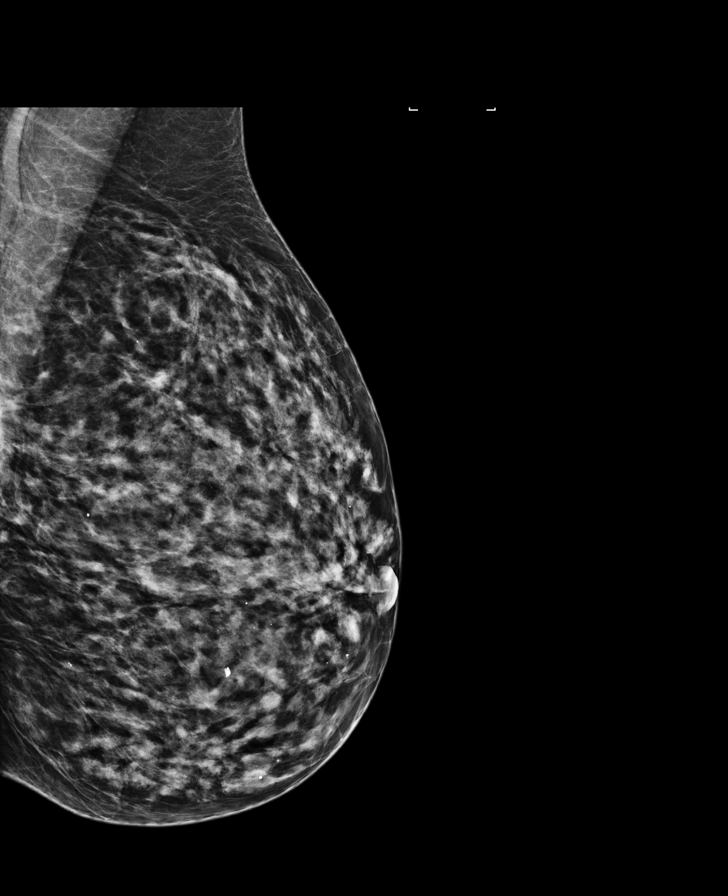

[R XCCL]
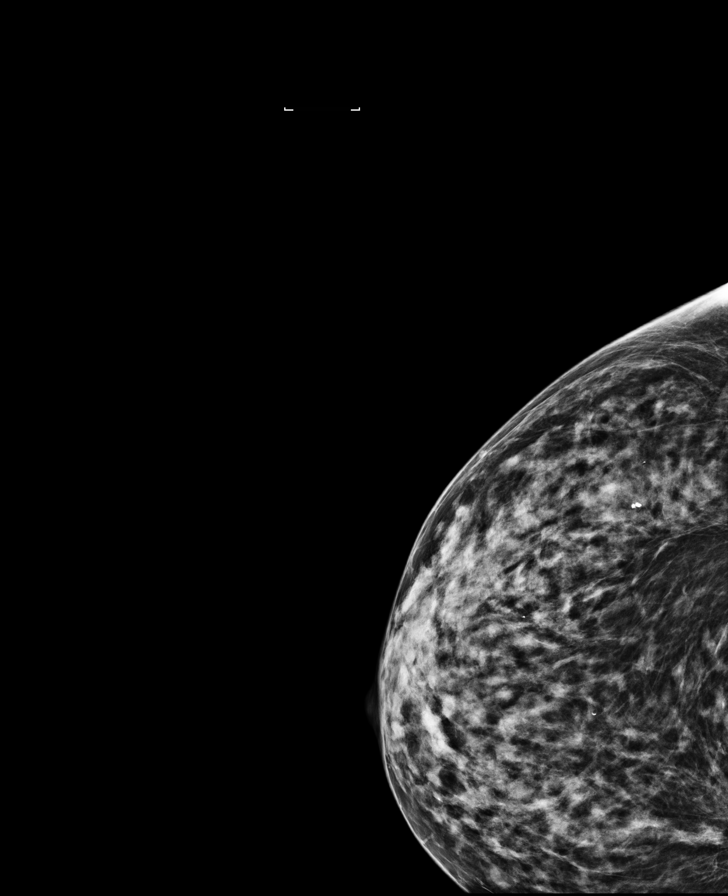

[R CC (2 of 2)]
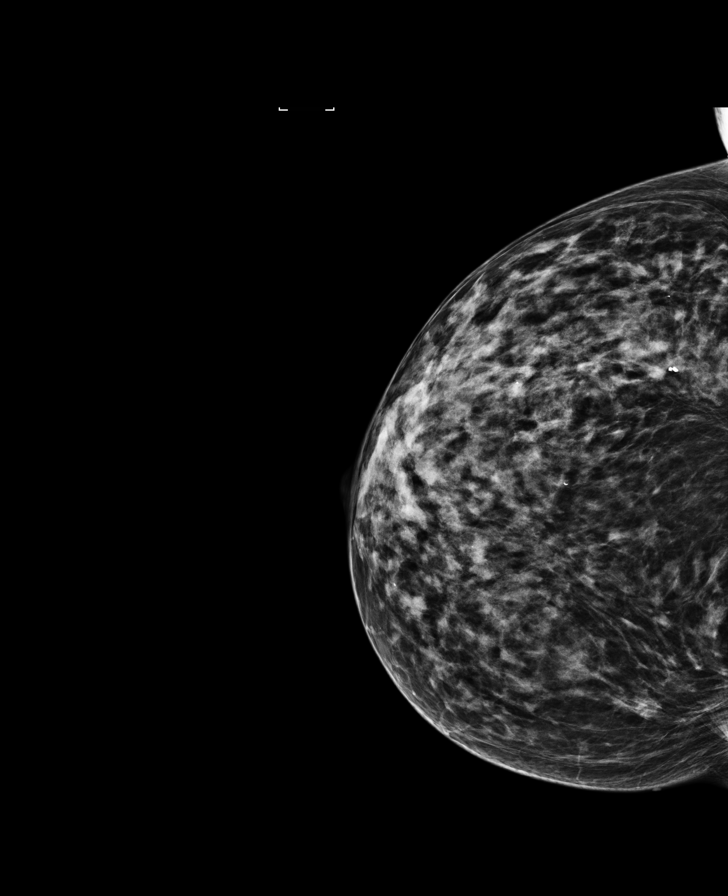

[R MLO]
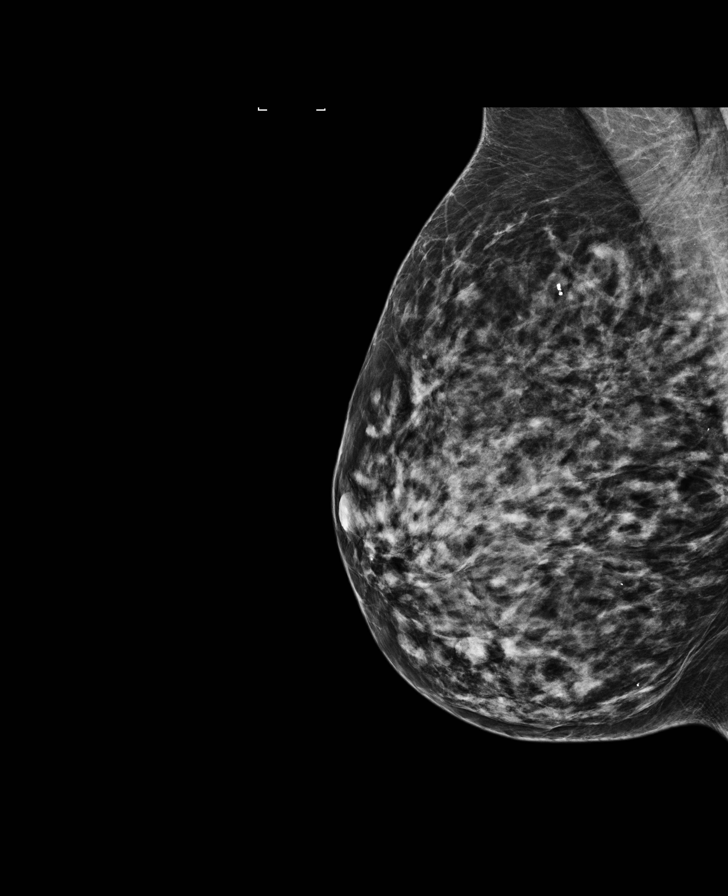

[L CC]
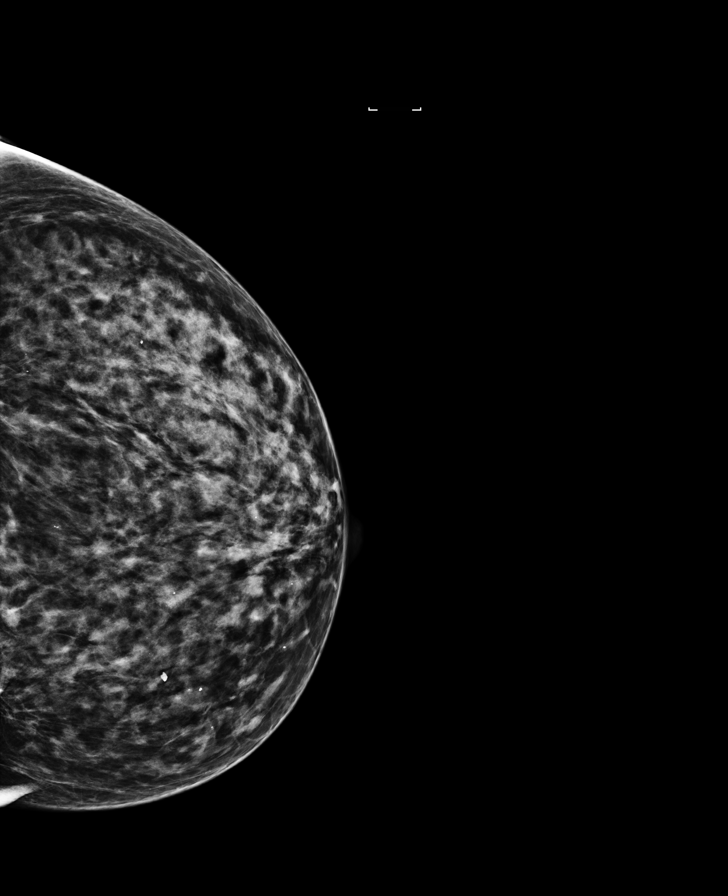

[L MLO (2 of 2)]
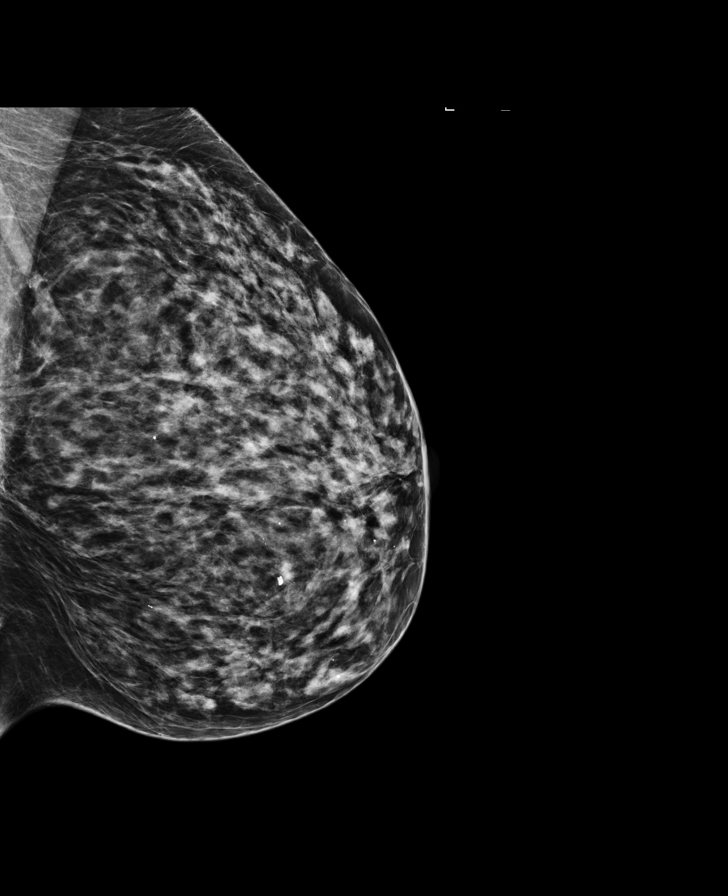

[7 of 7 positions shown; findings below may reference images not displayed]

ACR Breast Density Category c: The breast tissue is heterogeneously
dense, which may obscure small masses
FINDINGS: There are no findings suspicious for malignancy. Images were
processed with CAD.
IMPRESSION: No mammographic evidence of malignancy. A result letter of this
screening mammogram will be mailed directly to the patient.

RECOMMENDATION:
Screening mammogram in one year. (Code:U2-0-761)

BI-RADS CATEGORY  1: Negative.

## 2016-12-09 ENCOUNTER — Other Ambulatory Visit: Payer: Self-pay | Admitting: Medical

## 2016-12-09 ENCOUNTER — Ambulatory Visit (INDEPENDENT_AMBULATORY_CARE_PROVIDER_SITE_OTHER): Payer: Medicare Other | Admitting: Medical

## 2016-12-09 VITALS — BP 110/60 | HR 61 | Wt 154.0 lb

## 2016-12-09 DIAGNOSIS — Z780 Asymptomatic menopausal state: Secondary | ICD-10-CM

## 2016-12-09 DIAGNOSIS — E2839 Other primary ovarian failure: Secondary | ICD-10-CM

## 2016-12-09 DIAGNOSIS — M810 Age-related osteoporosis without current pathological fracture: Secondary | ICD-10-CM

## 2016-12-09 DIAGNOSIS — I429 Cardiomyopathy, unspecified: Secondary | ICD-10-CM

## 2016-12-09 DIAGNOSIS — R634 Abnormal weight loss: Secondary | ICD-10-CM | POA: Diagnosis not present

## 2016-12-09 NOTE — Progress Notes (Signed)
  Subjective:  Rita Wright is a 66 y.o. female who presents for insurance concern.  She applied for life insurance, advised them of her health history, but reported a lab result came back abnormal.  The insurance group advised she come and talk to her doctor.  She mentioned heart failure.    She exercises some with walking.  She denied any recent SOB, DOE, chest pain, palpitations, edema, fatigue, syncope.   She normally has a lower than normal BP.  She notes last seeing cardiology within past 1.5 years and was told she only needed to recheck on prn basis.   She notes last cardiac visit they said her heart condition was stable and not a big concern at this time.   No other aggravating or relieving factors.    No other c/o.  The following portions of the patient's history were reviewed and updated as appropriate: allergies, current medications, past family history, past medical history, past social history, past surgical history and problem list.  ROS Otherwise as in subjective above  Objective:Exam BP 110/60   Pulse 61   Wt 154 lb (69.9 kg)   LMP 02/23/1992   SpO2 98%   BMI 24.86 kg/m   General appearance: alert, no distress, WD/WN Neck: supple, no lymphadenopathy, no thyromegaly, no masses, no JVD, no bruits Heart: RRR, normal S1, S2, no murmurs Lungs: CTA bilaterally, no wheezes, rhonchi, or rales Abdomen: +bs, soft, non tender, non distended, no masses, no hepatomegaly, no splenomegaly Pulses: 2+ radial pulses, 2+ pedal pulses, normal cap refill Ext: no edema   Adult ECG Report  Indication: cardiomyopathy history  Rate: 71 bpm  Rhythm: sinus rhythm with occasional PVC  QRS Axis: -72 degrees  PR Interval: 132ms  QRS Duration: 175ms  QTc: 483ms  Conduction Disturbances: sinus rhythm with occasional PVC  Other Abnormalities: none  Patient's cardiac risk factors are: left anterior fascicular block, atrial  enlargement.  EKG comparison: 03/2014  Narrative Interpretation: no new changes, but abnormal EKG     Assessment: Encounter Diagnoses  Name Primary?  . Cardiomyopathy, unspecified type ([place]) Yes  . Post-menopausal   . Estrogen deficiency   . Osteoporosis, unspecified osteoporosis type, unspecified pathological fracture presence      Plan: I reviewed her prior cardiology records from 05/07/15.  As of 04/2014 her echocardiogram showed left ventricle cavity mildly dilated, but normal global wall motion, normal filling, calculated EF 57%, mild mitral and tricuspid regurgitation, no pulmonary HTN, left atrial cavity mildly dilated.   The office note that date advised low salt low cholesterol diet, regular exercise, avoid caffeine and stimulants, and was advised to f/u prn.    We will check a BNP today  I advised regular exercise, salt avoidance, stimulant avoidance, healthy diet, and advised she get me a copy of the insurance lab panel   Go for bone density scan as advised last visit.  Follow up: pending lab  Enisa was seen today for discuss possible heart failure.  Diagnoses and all orders for this visit:  Cardiomyopathy, unspecified type ([place]) -     Brain natriuretic peptide -     EKG 12-Lead  Post-menopausal  Estrogen deficiency  Osteoporosis, unspecified osteoporosis type, unspecified pathological fracture presence

## 2016-12-10 LAB — BRAIN NATRIURETIC PEPTIDE: Brain Natriuretic Peptide: 160 pg/mL — ABNORMAL HIGH (ref <100)

## 2016-12-13 ENCOUNTER — Institutional Professional Consult (permissible substitution): Payer: Medicare Other | Admitting: Medical

## 2016-12-17 IMAGING — US US THYROID BIOPSY
1 series · 13 of 20 positions shown · non-contrast
Comparison: US Soft Tissue Head/Neck 10/09/14

MEDICATIONS:
10 cc 1% lidocaine

COMPLICATIONS:
None immediate

INDICATION: Indeterminate thyroid nodule

Left thyroid nodule
EXAM:
ULTRASOUND GUIDED FINE NEEDLE ASPIRATION OF INDETERMINATE LEFT
THYROID NODULE
TECHNIQUE: Informed written consent was obtained from the patient after a
discussion of the risks, benefits and alternatives to treatment.
Questions regarding the procedure were encouraged and answered. A
timeout was performed prior to the initiation of the procedure.

[Series 1: us thyroid biopsy · 0.08mm/px · 20 acquisitions, 13 frames shown]
[im 1/20]
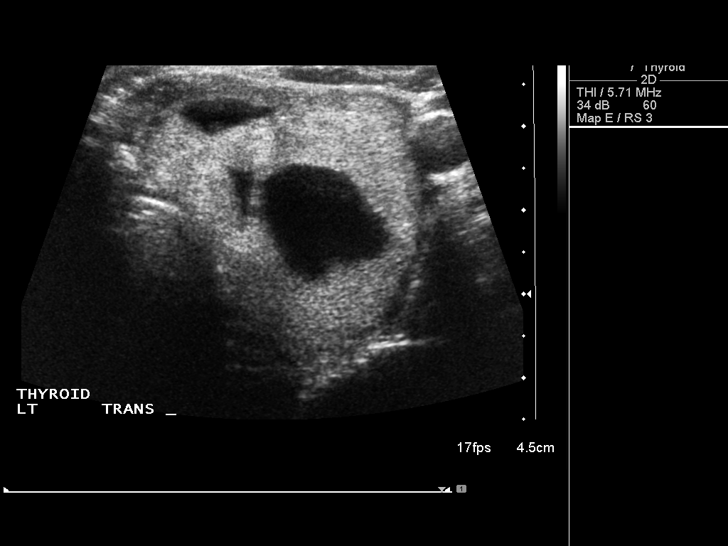
[im 3/20]
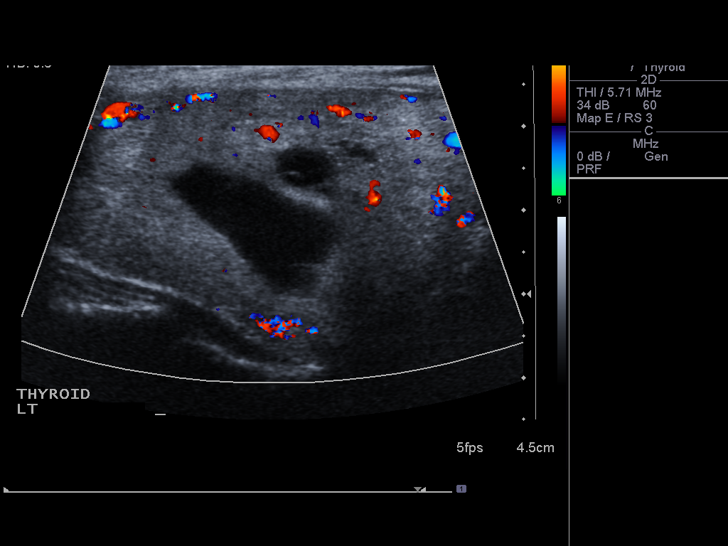
[im 4/20]
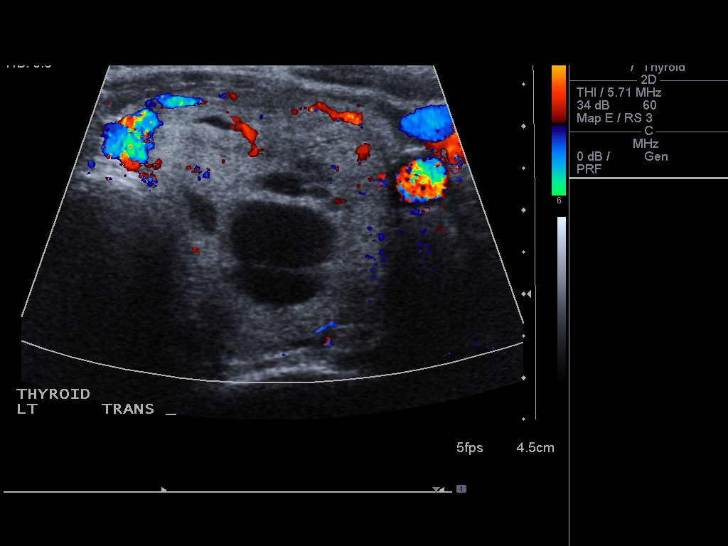
[im 6/20]
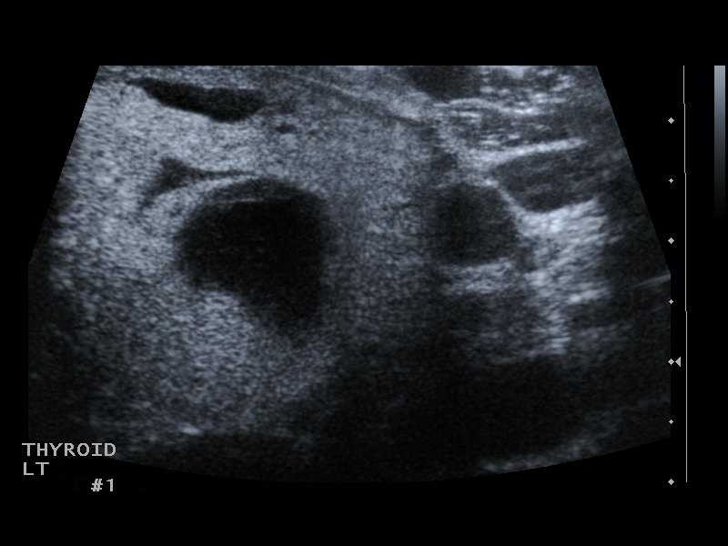
[im 7/20]
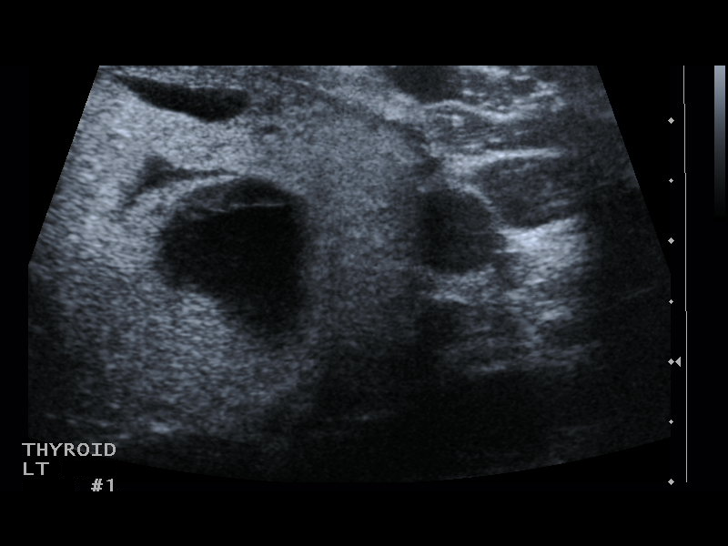
[im 9/20]
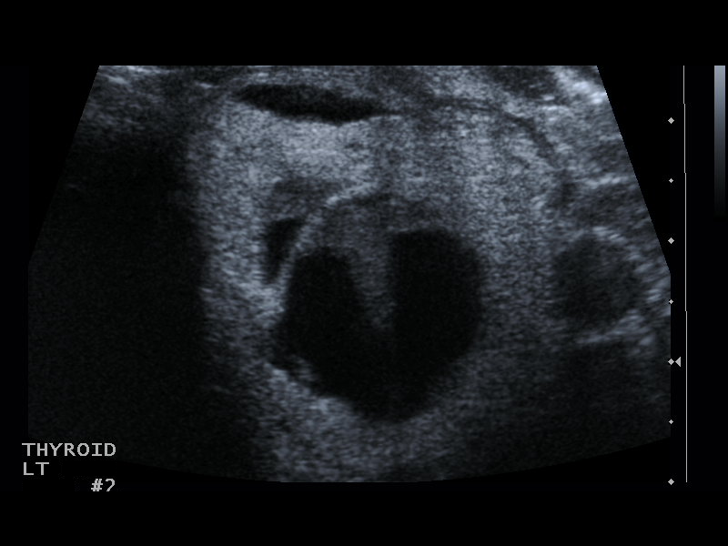
[im 11/20]
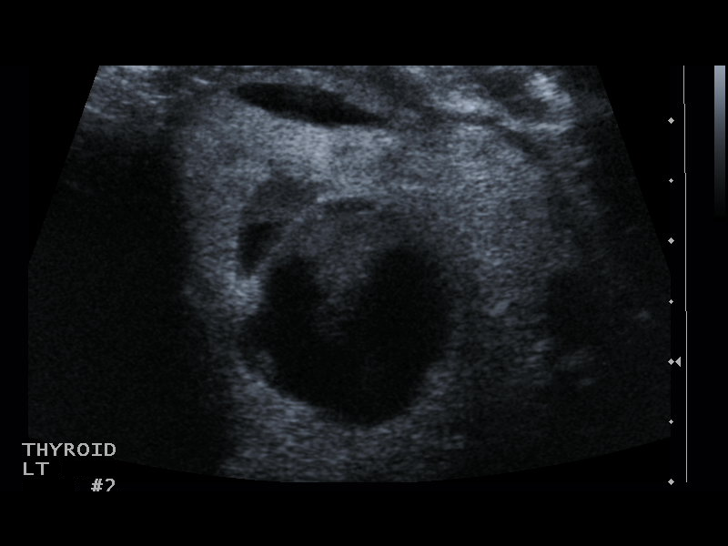
[im 12/20]
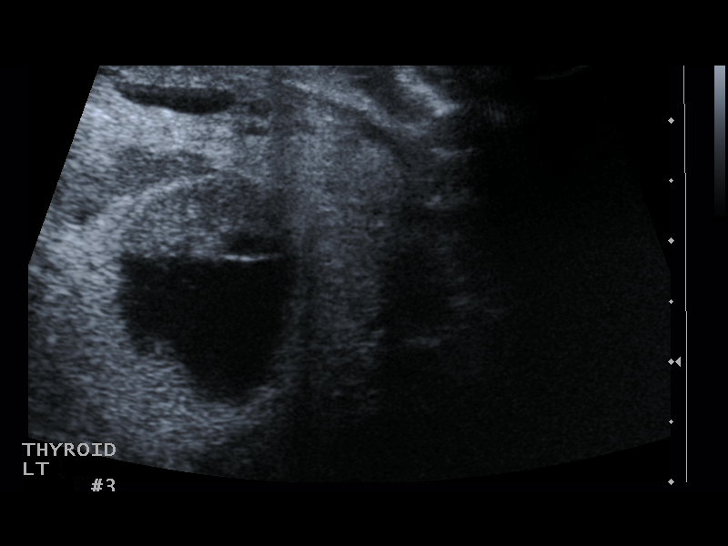
[im 14/20]
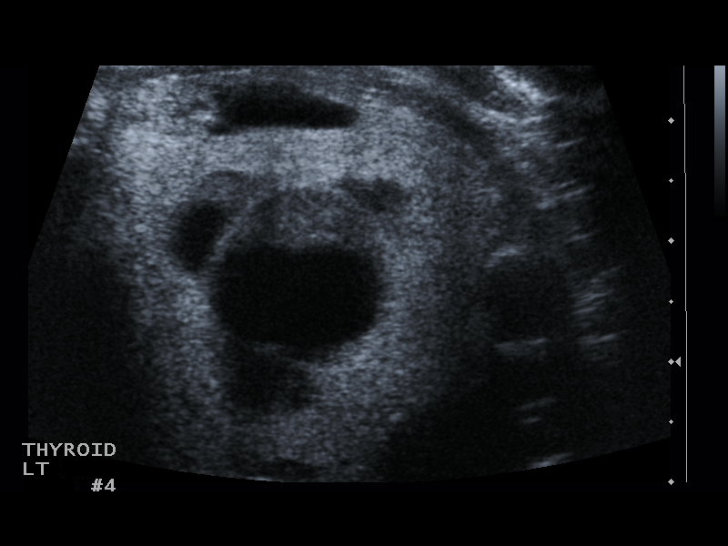
[im 15/20]
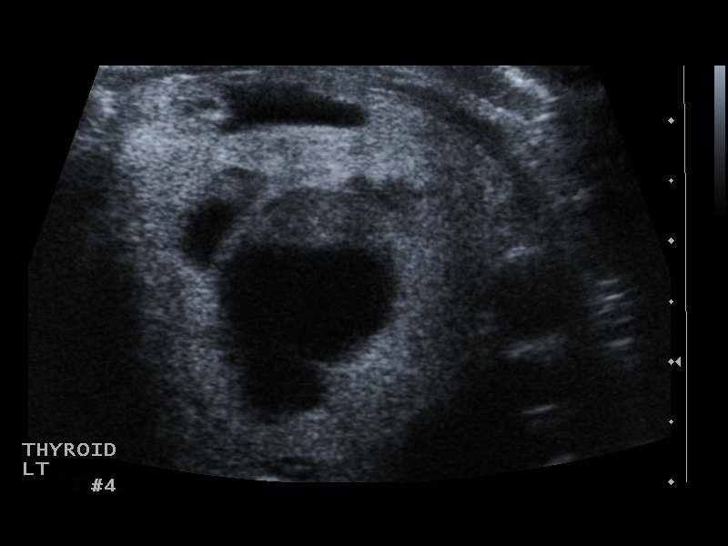
[im 17/20]
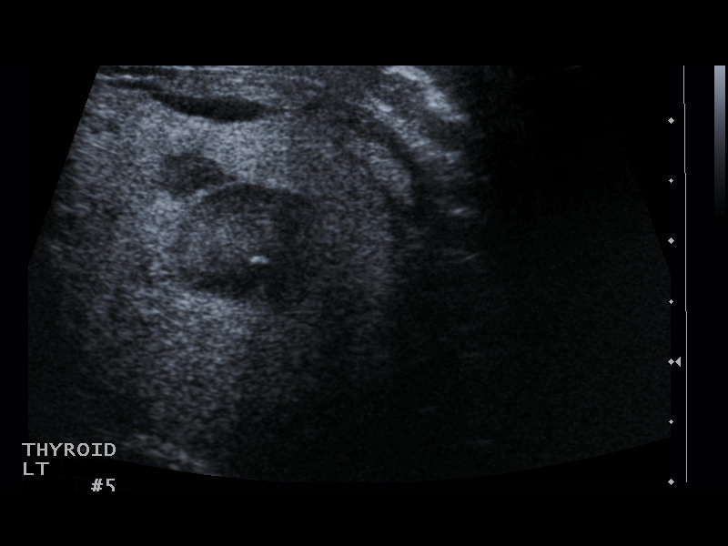
[im 18/20]
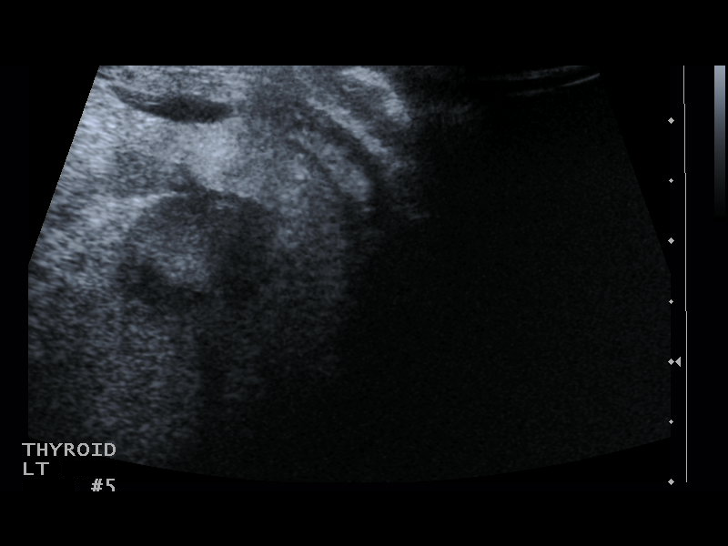
[im 20/20]
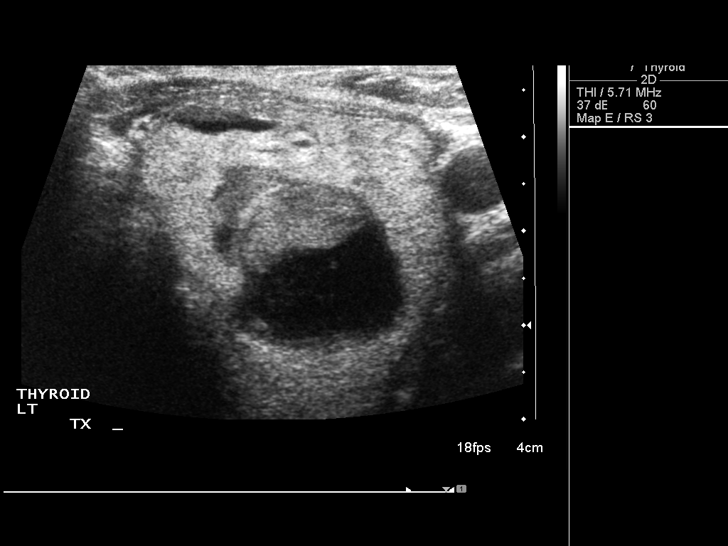

[13 of 20 positions shown; findings below may reference images not displayed]

Pre-procedural ultrasound scanning demonstrated Left thyroid nodule
; 5.7 x 3.1 x 3.2 cm

The procedure was planned. The neck was prepped in the usual sterile
fashion, and a sterile drape was applied covering the operative
field. A timeout was performed prior to the initiation of the
procedure. Local anesthesia was provided with 1% lidocaine.

Under direct ultrasound guidance, 4 FNA biopsies were performed of
the Left thyroid nodule with a 25 gauge needle. 1 FNA biopsy was
performed using 22 gauge Inrad needle. The samples were prepared and
submitted to pathology.

Limited post procedural scanning was negative for hematoma or
additional complication. Dressings were placed. The patient
tolerated the above procedures procedure well without immediate
postprocedural complication.
IMPRESSION: Technically successful ultrasound guided fine needle aspiration of L
middle pole thyroid nodule.

Read by:  Shaun Dolores

## 2017-01-29 ENCOUNTER — Other Ambulatory Visit: Payer: Self-pay | Admitting: Medical

## 2017-02-11 DIAGNOSIS — H2513 Age-related nuclear cataract, bilateral: Secondary | ICD-10-CM | POA: Diagnosis not present

## 2017-02-11 DIAGNOSIS — D3131 Benign neoplasm of right choroid: Secondary | ICD-10-CM | POA: Diagnosis not present

## 2017-04-21 ENCOUNTER — Other Ambulatory Visit: Payer: Self-pay | Admitting: Medical

## 2017-05-03 ENCOUNTER — Ambulatory Visit (INDEPENDENT_AMBULATORY_CARE_PROVIDER_SITE_OTHER): Payer: Medicare Other | Admitting: Medical

## 2017-05-03 ENCOUNTER — Encounter: Payer: Self-pay | Admitting: Medical

## 2017-05-03 VITALS — BP 98/68 | HR 67 | Temp 98.0°F | Wt 157.0 lb

## 2017-05-03 DIAGNOSIS — J069 Acute upper respiratory infection, unspecified: Secondary | ICD-10-CM

## 2017-05-03 DIAGNOSIS — J029 Acute pharyngitis, unspecified: Secondary | ICD-10-CM

## 2017-05-03 LAB — POCT RAPID STREP A (OFFICE): Rapid Strep A Screen: NEGATIVE

## 2017-05-03 NOTE — Progress Notes (Signed)
Subjective: Chief Complaint  Patient presents with  . sore throat    sore throat , chills, drainage , some coughing    Here with illness.  Daughter with strep last week.   Daughter and her whole household had the flu a few weeks ago.   She notes 1 day of sore throat, lots of cough, post nasal drainage, nasal congestion, mucous in throat, hoarse.  No body aches.  No fever, but feels some chills.  No other aggravating or relieving factors. No other complaint.  Past Medical History:  Diagnosis Date  . Colon cancer screening 09/2014   Cologuard test negative   . Dilated cardiomyopathy ([place]) 05/2014   Dr. Woody Seller  . Echocardiogram abnormal 05/2014   mild dilation of Left Ventricle; Dr. Woody Seller  . Hashimoto's thyroiditis   . History of mammogram 10/10/14   normal  . Multinodular goiter   . Osteoporosis   . Renal insufficiency   . Thyroid nodule 0/76/22   benign follicular nodule on biopsy   Current Outpatient Medications on File Prior to Visit  Medication Sig Dispense Refill  . cholecalciferol (VITAMIN D) 400 UNITS TABS Take 400 Units by mouth.    . folic acid (FOLVITE) 1 MG tablet TAKE 1 TABLET BY MOUTH ONCE A DAY 90 tablet 2  . Lactobacillus (PROBIOTIC ACIDOPHILUS PO) Take by mouth.    . levothyroxine (SYNTHROID, LEVOTHROID) 100 MCG tablet TAKE 1 TABLET BY MOUTH EVERY DAY BEFORE BREAKFAST 90 tablet 3   No current facility-administered medications on file prior to visit.    ROS as in subjective   Objective: BP 98/68   Pulse 67   Temp 98 F (36.7 C)   Wt 157 lb (71.2 kg)   LMP 02/23/1992   SpO2 98%   BMI 25.34 kg/m   General appearance: alert, no distress, WD/WN, mildly ill appearing HEENT: normocephalic, sclerae anicteric, conjunctiva pink and moist, TMs pearly, nares patent, clear discharge +erythema, pharynx with mild erythema Oral cavity: MMM, no lesions Neck: supple, no lymphadenopathy, no thyromegaly, no masses Lungs: CTA bilaterally, no wheezes, rhonchi, or rales      Assessment: Encounter Diagnoses  Name Primary?  . Sore throat Yes  . Upper respiratory tract infection, unspecified type     Plan: Strep negative.  Symptoms and exam suggest URI.  discussed supportive care, rest, hydration.   If worse or not improving in the next 3-4 days, then call or return.  Rita Wright was seen today for sore throat.  Diagnoses and all orders for this visit:  Sore throat -     Rapid Strep A  Upper respiratory tract infection, unspecified type

## 2017-08-22 ENCOUNTER — Telehealth: Payer: Self-pay | Admitting: Family Medicine

## 2017-08-22 ENCOUNTER — Ambulatory Visit (INDEPENDENT_AMBULATORY_CARE_PROVIDER_SITE_OTHER): Payer: Medicare Other | Admitting: Medical

## 2017-08-22 VITALS — BP 138/90 | HR 72 | Temp 97.8°F | Resp 16 | Ht 67.0 in | Wt 155.8 lb

## 2017-08-22 DIAGNOSIS — R61 Generalized hyperhidrosis: Secondary | ICD-10-CM

## 2017-08-22 DIAGNOSIS — I4821 Permanent atrial fibrillation: Secondary | ICD-10-CM

## 2017-08-22 DIAGNOSIS — E039 Hypothyroidism, unspecified: Secondary | ICD-10-CM | POA: Diagnosis not present

## 2017-08-22 DIAGNOSIS — R5383 Other fatigue: Secondary | ICD-10-CM | POA: Diagnosis not present

## 2017-08-22 DIAGNOSIS — I4891 Unspecified atrial fibrillation: Secondary | ICD-10-CM

## 2017-08-22 HISTORY — DX: Permanent atrial fibrillation: I48.21

## 2017-08-22 LAB — T4, FREE: Free T4: 1.28 ng/dL (ref 0.82–1.77)

## 2017-08-22 LAB — BASIC METABOLIC PANEL
BUN/Creatinine Ratio: 34 — ABNORMAL HIGH (ref 12–28)
BUN: 44 mg/dL — AB (ref 8–27)
CO2: 26 mmol/L (ref 20–29)
CREATININE: 1.3 mg/dL — AB (ref 0.57–1.00)
Calcium: 9.7 mg/dL (ref 8.7–10.3)
Chloride: 102 mmol/L (ref 96–106)
GFR calc Af Amer: 49 mL/min/{1.73_m2} — ABNORMAL LOW (ref >59)
GFR, EST NON AFRICAN AMERICAN: 42 mL/min/{1.73_m2} — AB (ref >59)
Glucose: 93 mg/dL (ref 65–99)
POTASSIUM: 5.2 mmol/L (ref 3.5–5.2)
Sodium: 143 mmol/L (ref 134–144)

## 2017-08-22 LAB — CBC
Hematocrit: 50.9 % — ABNORMAL HIGH (ref 34.0–46.6)
Hemoglobin: 17 g/dL — ABNORMAL HIGH (ref 11.1–15.9)
MCH: 31.7 pg (ref 26.6–33.0)
MCHC: 33.4 g/dL (ref 31.5–35.7)
MCV: 95 fL (ref 79–97)
Platelets: 191 10*3/uL (ref 150–450)
RBC: 5.37 x10E6/uL — AB (ref 3.77–5.28)
RDW: 12.9 % (ref 12.3–15.4)
WBC: 6.4 10*3/uL (ref 3.4–10.8)

## 2017-08-22 LAB — T3: T3, Total: 69 ng/dL — ABNORMAL LOW (ref 71–180)

## 2017-08-22 LAB — TSH: TSH: 4.34 u[IU]/mL (ref 0.450–4.500)

## 2017-08-22 MED ORDER — METOPROLOL TARTRATE 25 MG PO TABS
25.0000 mg | ORAL_TABLET | Freq: Two times a day (BID) | ORAL | 2 refills | Status: DC
Start: 2017-08-22 — End: 2017-11-14

## 2017-08-22 MED ORDER — ASPIRIN EC 81 MG PO TBEC: 81.0000 mg | DELAYED_RELEASE_TABLET | Freq: Every day | ORAL | 2 refills | Status: DC

## 2017-08-22 NOTE — Telephone Encounter (Signed)
Advised pt of same via phone.

## 2017-08-22 NOTE — Patient Instructions (Signed)

## 2017-08-22 NOTE — Telephone Encounter (Signed)
When patient checked out, she doesn't remember what you said about Dr. Octavio Manns, are they calling her for appt?

## 2017-08-22 NOTE — Telephone Encounter (Signed)
Let her know we will coordinate with Dr. Woody Seller office to get her scheduled in the next 2 weeks.   So it will be a call from either [place] or Dr. Woody Seller office.

## 2017-08-22 NOTE — Progress Notes (Addendum)
Subjective: Chief Complaint  Patient presents with  . Fatigue    sweaty, sob, mouth pain, X friday   Here for possible Afib.  She has hx/o mild dilated cardiomyopathy 2016, hypothyroidism, renal insufficiency, hyperlipidemia, and goiter here for fatigue, sweats, anxious feeling.   Here today for complaints of fatigue, sweats, unusual sensation in her neck mostly for the last 3 days but she notes even a few weeks ago feeling unusual, anxious.   She wears an apple watch that has then heart rhythm feature And is stated that she was in atrial fibrillation this past Friday 4 days ago.  Denies edema, chest pain, arm pain, paresthesias.  She has been feeling more fatigued in general.  She takes her levothyroxine every night per agreement she had with endocrinology the last time she saw them couple years ago.  She specifically notes that she was told not to take aspirin with her levothyroxine, thus she is never been on aspirin regularly   Past Medical History:  Diagnosis Date  . Colon cancer screening 09/2014   Cologuard test negative   . Dilated cardiomyopathy ([place]) 05/2014   Dr. Woody Seller  . Echocardiogram abnormal 05/2014   mild dilation of Left Ventricle; Dr. Woody Seller  . Hashimoto's thyroiditis   . History of mammogram 10/10/14   normal  . Multinodular goiter   . Osteoporosis   . Renal insufficiency   . Thyroid nodule 05/07/15   benign follicular nodule on biopsy   Current Outpatient Medications on File Prior to Visit  Medication Sig Dispense Refill  . cholecalciferol (VITAMIN D) 400 UNITS TABS Take 400 Units by mouth.    . folic acid (FOLVITE) 1 MG tablet TAKE 1 TABLET BY MOUTH ONCE A DAY 90 tablet 2  . Lactobacillus (PROBIOTIC ACIDOPHILUS PO) Take by mouth.    . levothyroxine (SYNTHROID, LEVOTHROID) 100 MCG tablet TAKE 1 TABLET BY MOUTH EVERY DAY BEFORE BREAKFAST 90 tablet 3   No current facility-administered medications on file prior to visit.    ROS as in subjective    Objective: BP  138/90   Pulse 72   Temp 97.8 F (36.6 C) (Oral)   Resp 16   Ht 5\' 7"  (1.702 m)   Wt 155 lb 12.8 oz (70.7 kg)   LMP 02/23/1992   SpO2 96%   BMI 24.40 kg/m    General appearence: alert, no distress, WD/WN,  HEENT: normocephalic, sclerae anicteric, PERRLA, EOMi, nares patent, no discharge or erythema, pharynx normal Oral cavity: MMM, no lesions Neck: supple, no lymphadenopathy, no thyromegaly, no masses Heart: irregular, otherwise normal S1, S2, no murmurs Lungs: CTA bilaterally, no wheezes, rhonchi, or rales Extremities: no edema, no cyanosis, no clubbing Pulses: 2+ symmetric, upper and lower extremities, normal cap refill Neurological: alert, oriented x 3, CN2-12 intact, strength normal upper extremities and lower extremities, sensation normal throughout, DTRs 2+ throughout, no cerebellar signs, gait normal Psychiatric: normal affect, behavior normal, pleasant     Adult ECG Report  Indication: palpitations  Rate: 125bpm  Rhythm: atrial fibrillation  QRS Axis: -47 degrees  PR Interval: unable to determine  QRS Duration: 41ms  QTc: 486ms  Conduction Disturbances: left axis deviation, possible prior infarct  Other Abnormalities: none  Patient's cardiac risk factors are: advanced age (older than 54 for men, 55 for women).  EKG comparison: none  Narrative Interpretation: new onset afib    Assessment: Encounter Diagnoses  Name Primary?  . Atrial fibrillation, unspecified type ([place]) Yes  . Excessive sweating   . Hypothyroidism,  unspecified type   . Fatigue, unspecified type       Plan: Atrial fibrillation- discussed EKG findings, her recent symptoms, possible complications, and next steps.  I called and discussed case with Dr. Woody Seller at [facility], as well as supervising physician Dr. Redmond School.    After discussing case, we will begin her on Metoprolol for rate control, and Aspirin, given [place] score of 2.   We discussed options for anticoagulation.   We will make  her a follow up appt with Dr. Woody Seller.  Discussed symptom or other concerns that would prompt visit to ED or call to 911.  Answered her questions.    Hypothyroidism-continue current levothyroxine, and once we have the current situation minus we will plan to update thyroid ultrasound in the next month or so  Telisa was seen today for fatigue.  Diagnoses and all orders for this visit:  Atrial fibrillation, unspecified type ([place]) -     EKG 12-Lead -     TSH -     T4, free -     T3 -     Basic metabolic panel -     CBC  Excessive sweating -     EKG 12-Lead -     Basic metabolic panel -     CBC  Hypothyroidism, unspecified type -     TSH -     T4, free -     T3  Fatigue, unspecified type  Other orders -     metoprolol tartrate (LOPRESSOR) 25 MG tablet; Take 1 tablet (25 mg total) by mouth 2 (two) times daily. 1 tablet po BID for blood pressure -     aspirin EC 81 MG tablet; Take 1 tablet (81 mg total) by mouth daily.

## 2017-08-24 ENCOUNTER — Telehealth: Payer: Self-pay | Admitting: Medical

## 2017-08-24 NOTE — Telephone Encounter (Signed)
Pt states that her smartwatch just told her that she is in afib while she was laying down. Pt has been stressed this morning with no energy & if she move arourd too much she starts to sweat. At first she thought the symptoms were related to stress but now her watch is telling her that she is in afib again. She is taking meds that she was given at her last appointment. Should pt be doing something else? Should she be concerned?

## 2017-08-24 NOTE — Telephone Encounter (Signed)
Continue the same medication, Metoprolol and Aspirin.    The metoprolol is to slow down the heart response, it may or may not straighten out the rhythm abnormality.   Sometimes people convert spontaneously back to normal rhythm.   When she sees cardiology next week, they may add or modify the medications to get the rhythm back to normal.   Yes the watch will still saw afib as she is in afib currently.  Lets give the medication some more time to help control symptoms.     If symptom worsen or change let us know, but so far this doesn't mean we need to change the treatment

## 2017-08-24 NOTE — Telephone Encounter (Signed)
Patient notified of recommendations and she states that she is just worried about what to do when this happens.  I told her to rest and stay hydrated.  She just wanted to make sure that she is not in danger.  I tried to reassure her to continue her medication and that unless she is having worse symptoms, she should be ok until she sees cardiology next week.

## 2017-08-30 DIAGNOSIS — I4891 Unspecified atrial fibrillation: Secondary | ICD-10-CM | POA: Diagnosis not present

## 2017-08-30 DIAGNOSIS — I517 Cardiomegaly: Secondary | ICD-10-CM | POA: Diagnosis not present

## 2017-08-30 DIAGNOSIS — Z8639 Personal history of other endocrine, nutritional and metabolic disease: Secondary | ICD-10-CM | POA: Diagnosis not present

## 2017-08-30 DIAGNOSIS — Z87891 Personal history of nicotine dependence: Secondary | ICD-10-CM | POA: Diagnosis not present

## 2017-09-05 DIAGNOSIS — I4891 Unspecified atrial fibrillation: Secondary | ICD-10-CM | POA: Diagnosis not present

## 2017-09-06 ENCOUNTER — Telehealth: Payer: Self-pay | Admitting: Medical

## 2017-09-06 NOTE — Telephone Encounter (Signed)
Pt's husband came in and dropped off [employer]. Per pt she is medically unable to do this and would like a letter to send to them. Please call pt at [phone] or [phone] when ready.

## 2017-09-07 ENCOUNTER — Ambulatory Visit: Payer: Medicare Other | Admitting: Medical

## 2017-09-07 NOTE — Telephone Encounter (Signed)
Patient was seen yesterday stress test and she has seen cardiology twice.     Patient states that she has severe panic attacks and is unable to sit in courtroom.

## 2017-09-07 NOTE — Telephone Encounter (Signed)
pls get copy of cardiology notes.  Check scan pile as they may have come in already

## 2017-09-07 NOTE — Telephone Encounter (Signed)
Has she seen cardiologist yet since I saw her?  What reasons is she stating she can't sit in a courtroom for jury duty?

## 2017-09-08 ENCOUNTER — Encounter: Payer: Self-pay | Admitting: Medical

## 2017-09-14 ENCOUNTER — Other Ambulatory Visit: Payer: Medicare Other

## 2017-09-14 ENCOUNTER — Telehealth: Payer: Self-pay | Admitting: Medical

## 2017-09-14 DIAGNOSIS — R718 Other abnormality of red blood cells: Secondary | ICD-10-CM | POA: Diagnosis not present

## 2017-09-14 LAB — CBC WITH DIFFERENTIAL/PLATELET
BASOS: 0 %
Basophils Absolute: 0 10*3/uL (ref 0.0–0.2)
EOS (ABSOLUTE): 0.1 10*3/uL (ref 0.0–0.4)
EOS: 2 %
HEMOGLOBIN: 15.5 g/dL (ref 11.1–15.9)
Hematocrit: 48.2 % — ABNORMAL HIGH (ref 34.0–46.6)
IMMATURE GRANULOCYTES: 0 %
Immature Grans (Abs): 0 10*3/uL (ref 0.0–0.1)
Lymphocytes Absolute: 1.2 10*3/uL (ref 0.7–3.1)
Lymphs: 20 %
MCH: 30.5 pg (ref 26.6–33.0)
MCHC: 32.2 g/dL (ref 31.5–35.7)
MCV: 95 fL (ref 79–97)
MONOCYTES: 6 %
MONOS ABS: 0.4 10*3/uL (ref 0.1–0.9)
NEUTROS PCT: 72 %
Neutrophils Absolute: 4.5 10*3/uL (ref 1.4–7.0)
Platelets: 178 10*3/uL (ref 150–450)
RBC: 5.08 x10E6/uL (ref 3.77–5.28)
RDW: 13 % (ref 12.3–15.4)
WBC: 6.3 10*3/uL (ref 3.4–10.8)

## 2017-09-14 NOTE — Telephone Encounter (Signed)
Pt dropped of jury duty paper and is is requesting that you rewrite this letter for to be excused from jury duty, shane wrote the letter, and pt does not except the letter as the way it is. Put in your folder, pt can be reached at [phone] informed pt that you was out of the office today

## 2017-09-15 NOTE — Telephone Encounter (Signed)
Your problem!

## 2017-09-19 ENCOUNTER — Ambulatory Visit (INDEPENDENT_AMBULATORY_CARE_PROVIDER_SITE_OTHER): Payer: Medicare Other | Admitting: Medical

## 2017-09-19 ENCOUNTER — Encounter: Payer: Self-pay | Admitting: Medical

## 2017-09-19 VITALS — BP 110/88 | HR 97 | Temp 98.1°F | Ht 67.0 in | Wt 159.8 lb

## 2017-09-19 DIAGNOSIS — F410 Panic disorder [episodic paroxysmal anxiety] without agoraphobia: Secondary | ICD-10-CM | POA: Diagnosis not present

## 2017-09-19 DIAGNOSIS — F411 Generalized anxiety disorder: Secondary | ICD-10-CM

## 2017-09-19 NOTE — Patient Instructions (Signed)
RESOURCES in [place], [place]  If you are experiencing a mental health crisis or an emergency, please call 911 or go to the nearest emergency department.  [facility]   [phone] [facility]  [phone] [facility]   [phone]  Suicide Hotline 1-800-Suicide (1-800-784-2433)  National Suicide Prevention Lifeline 1-800-273-TALK  (1-800-273-8255)  Domestic Violence, Rape/Crisis - [facility] 336-273-7273  The National Domestic Violence Hotline 1-800-799-SAFE (1-800-799-7233)  To report Child or Elder Abuse, please call: [place] Police Department  [phone] [place] [employer] Department  [phone]  LGBT Youth Crisis Line 1-866-488-7386  Teen Crisis line 336-387-6161 or 1-877-332-7333     Psychiatry and Counseling services  [facility] [address], [place], [place] [address] [phone]  Holly Ingram, therapist Dr. Carey Cottle, psychiatrist Dr. Glenn Jennings, child psychiatrist   Dr. Tarence Searcy Fuller [address], [place], [place] [address] [phone]   Dr. Rupinder Kaur, psychiatry [address], [place], [place] [address] [phone]   [facility] [address], [place], [place] [address] [phone]   [facility] [address], [place], [place] [address] [phone]    Counseling Services (NON- psychiatrist offices)  [facility] [address], [place], [place] [address] [phone]   [facility] [phone] [address], [place], [place] [address]   [facility] [phone]  [email or web address] [address], [place], [place] [address]   Merrianne M. Leff, therapist [phone] [address] [place], [place] [address]   [facility] [phone] [address], , [place]  [address]   Jill White-Huffman, therapist [phone] [address], , [place] [address]   [facility] [phone] [address], , [place] [address]   

## 2017-09-19 NOTE — Progress Notes (Signed)
Subjective: Chief Complaint  Patient presents with  . Anxiety    feel stressed (personal)    Here for consult on anxiety, recent jury duty letter.  Here with hsuband today who always accompanies her.    She is tearful and seems anxious today.   She notes a long history of anxiety and panic attack.  Since I have been seeing her last few years, we have mainly dealt with her medical problems and have not really focus much on her mental health issues as she tries to cope without any specific treatment.  She and her husband both provide history today.  Last visit with mental healht profression wasl 1990s.    From [place] went to mental health office "daily."   Went off and on to counseling for a while.   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  For years, couldn't even go out of her house.  Later on this got changed to anxiety and panic disorder.   Even on her best days its a stuggle to leave the house.  She can't go outside the house without food or drink.  Stress eating is a coping mechanism for her.  She hasn't been inside a grocery store in 20 years.   Occasionally will walk into [facility], can barely go into store.   She notes no specific diagnosis of depression, but at times down in moods.   Went thorugh a horrible depression in the past, took years to get out of this.  She notes she was so panicked when she went for her stress test recently, almost had to cancel this.  Had hard time just getting through the stress test.   The stress test should have taken 40minutes, and she was there during the test 1 hour.  She notes only being on medication once prior, back in 1995 after brother committed suicide.  She notes mental health problems throughout her family.  She notes mother had bad anxiety, was hospitaizliaed several times, had electroshock therapy.  Sister has had bad anixey issues.   Brother ended up comitting suicide over anxiety issues.   Her sister has bad issues with anxiety, and her  daughter just recently started going to a counselor  She denies hallucination, no delusions.  She is deathly afraid of using elevators or stairs.   Quit work 67 years old, not being able to function and normal setting with other people  She had a panic attack when she got the jury duty letter just thinking about having to go in a public building with people she does not know, and having to possibly use an elevator.  Her husband notes that if she were forced to do jury duty they will probably have to have an ambulance to come and get her  Past Medical History:  Diagnosis Date  . Colon cancer screening 09/2014   Cologuard test negative   . Dilated cardiomyopathy ([place]) 05/2014   Dr. Woody Seller  . Echocardiogram abnormal 05/2014   mild dilation of Left Ventricle; Dr. Woody Seller  . Hashimoto's thyroiditis   . History of mammogram 10/10/14   normal  . Multinodular goiter   . Osteoporosis   . Renal insufficiency   . Thyroid nodule 1/69/67   benign follicular nodule on biopsy   Current Outpatient Medications on File Prior to Visit  Medication Sig Dispense Refill  . cholecalciferol (VITAMIN D) 400 UNITS TABS Take 400 Units by mouth.    . folic acid (FOLVITE) 1 MG tablet TAKE 1 TABLET  BY MOUTH ONCE A DAY 90 tablet 2  . Lactobacillus (PROBIOTIC ACIDOPHILUS PO) Take by mouth.    . levothyroxine (SYNTHROID, LEVOTHROID) 100 MCG tablet TAKE 1 TABLET BY MOUTH EVERY DAY BEFORE BREAKFAST 90 tablet 3  . metoprolol tartrate (LOPRESSOR) 25 MG tablet Take 1 tablet (25 mg total) by mouth 2 (two) times daily. 1 tablet po BID for blood pressure 60 tablet 2  . Rivaroxaban (XARELTO) 15 MG TABS tablet Take 15 mg by mouth once.     No current facility-administered medications on file prior to visit.    ROS as in subjective    Objective: BP 110/88   Pulse 97   Temp 98.1 F (36.7 C) (Oral)   Ht 5\' 7"  (1.702 m)   Wt 159 lb 12.8 oz (72.5 kg)   LMP 02/23/1992   SpO2 97%   BMI 25.03 kg/m   Gen: wd, wn, upset,  crying, shaking at times    Assessment: Encounter Diagnoses  Name Primary?  . Panic disorder Yes  . Generalized anxiety disorder      Plan: We discussed her symptoms, long history of anxiety and panic disorder.  I advised she reestablish with a mental health provider at this time as medications and treatments have changed and improved over the years specifically since her last consult was back in the 90s  I wrote her a letter for jury duty today..  I gave her a list of mental health providers and strongly encouraged her to establish with a mental health provider to help her improve her quality life with this issue

## 2017-09-27 ENCOUNTER — Institutional Professional Consult (permissible substitution): Payer: Medicare Other | Admitting: Family Medicine

## 2017-09-29 ENCOUNTER — Encounter: Payer: Self-pay | Admitting: Family Medicine

## 2017-09-29 ENCOUNTER — Encounter: Payer: Self-pay | Admitting: Medical

## 2017-09-29 ENCOUNTER — Ambulatory Visit (INDEPENDENT_AMBULATORY_CARE_PROVIDER_SITE_OTHER): Payer: Medicare Other | Admitting: Family Medicine

## 2017-09-29 VITALS — BP 120/80 | HR 77 | Temp 97.6°F | Wt 159.2 lb

## 2017-09-29 DIAGNOSIS — F411 Generalized anxiety disorder: Secondary | ICD-10-CM | POA: Diagnosis not present

## 2017-09-29 DIAGNOSIS — F410 Panic disorder [episodic paroxysmal anxiety] without agoraphobia: Secondary | ICD-10-CM | POA: Diagnosis not present

## 2017-09-29 NOTE — Progress Notes (Signed)
   Subjective:    Patient ID: Rita Wright, female    DOB: Jul 12, 1950, 67 y.o.   MRN: [id number]  HPI He is here to get paperwork filled out to get out of jury duty.  She does have a panic disorder as well as generalized anxiety disorder.   Review of Systems     Objective:   Physical Exam Alert and in no distress otherwise not examined       Assessment & Plan:  Panic disorder  Generalized anxiety disorder A letter was written by Audelia Acton and I cosigned it concerning getting out of jury duty.

## 2017-10-05 DIAGNOSIS — I4891 Unspecified atrial fibrillation: Secondary | ICD-10-CM | POA: Diagnosis not present

## 2017-10-13 DIAGNOSIS — I4891 Unspecified atrial fibrillation: Secondary | ICD-10-CM | POA: Diagnosis not present

## 2017-10-13 DIAGNOSIS — Z8639 Personal history of other endocrine, nutritional and metabolic disease: Secondary | ICD-10-CM | POA: Diagnosis not present

## 2017-10-13 DIAGNOSIS — Z87891 Personal history of nicotine dependence: Secondary | ICD-10-CM | POA: Diagnosis not present

## 2017-10-13 DIAGNOSIS — I428 Other cardiomyopathies: Secondary | ICD-10-CM | POA: Diagnosis not present

## 2017-10-14 ENCOUNTER — Encounter: Payer: Self-pay | Admitting: Medical

## 2017-10-28 ENCOUNTER — Telehealth: Payer: Self-pay | Admitting: Family Medicine

## 2017-10-28 ENCOUNTER — Other Ambulatory Visit: Payer: Self-pay

## 2017-10-28 DIAGNOSIS — E2839 Other primary ovarian failure: Secondary | ICD-10-CM

## 2017-10-28 DIAGNOSIS — E039 Hypothyroidism, unspecified: Secondary | ICD-10-CM

## 2017-10-28 MED ORDER — FOLIC ACID 1 MG PO TABS: 1.0000 mg | ORAL_TABLET | Freq: Every day | ORAL | 2 refills | Status: DC

## 2017-10-28 MED ORDER — LEVOTHYROXINE SODIUM 100 MCG PO TABS: ORAL_TABLET | ORAL | 3 refills | Status: DC

## 2017-10-28 NOTE — Telephone Encounter (Signed)
done

## 2017-10-28 NOTE — Telephone Encounter (Signed)
Patient called stating that she needs her folic acid and levothyroxine sent to [facility] mail order  She states that they were supposed to have been sent previously but they have not

## 2017-11-14 ENCOUNTER — Ambulatory Visit: Payer: Medicare Other | Admitting: Medical

## 2017-11-14 ENCOUNTER — Other Ambulatory Visit: Payer: Self-pay | Admitting: Medical

## 2017-11-15 DIAGNOSIS — Z87891 Personal history of nicotine dependence: Secondary | ICD-10-CM | POA: Diagnosis not present

## 2017-11-15 DIAGNOSIS — Z8639 Personal history of other endocrine, nutritional and metabolic disease: Secondary | ICD-10-CM | POA: Diagnosis not present

## 2017-11-15 DIAGNOSIS — I481 Persistent atrial fibrillation: Secondary | ICD-10-CM | POA: Diagnosis not present

## 2017-11-15 DIAGNOSIS — I428 Other cardiomyopathies: Secondary | ICD-10-CM | POA: Diagnosis not present

## 2017-12-05 ENCOUNTER — Encounter: Payer: Self-pay | Admitting: Medical

## 2017-12-05 ENCOUNTER — Ambulatory Visit (INDEPENDENT_AMBULATORY_CARE_PROVIDER_SITE_OTHER): Payer: Medicare Other | Admitting: Medical

## 2017-12-05 VITALS — BP 120/76 | HR 85 | Temp 97.6°F | Resp 16 | Ht 67.0 in | Wt 159.8 lb

## 2017-12-05 DIAGNOSIS — I429 Cardiomyopathy, unspecified: Secondary | ICD-10-CM | POA: Diagnosis not present

## 2017-12-05 DIAGNOSIS — Z129 Encounter for screening for malignant neoplasm, site unspecified: Secondary | ICD-10-CM

## 2017-12-05 DIAGNOSIS — F411 Generalized anxiety disorder: Secondary | ICD-10-CM | POA: Diagnosis not present

## 2017-12-05 DIAGNOSIS — N289 Disorder of kidney and ureter, unspecified: Secondary | ICD-10-CM

## 2017-12-05 DIAGNOSIS — E2839 Other primary ovarian failure: Secondary | ICD-10-CM

## 2017-12-05 DIAGNOSIS — Z13220 Encounter for screening for lipoid disorders: Secondary | ICD-10-CM | POA: Diagnosis not present

## 2017-12-05 DIAGNOSIS — I4891 Unspecified atrial fibrillation: Secondary | ICD-10-CM | POA: Diagnosis not present

## 2017-12-05 DIAGNOSIS — E039 Hypothyroidism, unspecified: Secondary | ICD-10-CM | POA: Diagnosis not present

## 2017-12-05 DIAGNOSIS — Z23 Encounter for immunization: Secondary | ICD-10-CM | POA: Diagnosis not present

## 2017-12-05 DIAGNOSIS — F410 Panic disorder [episodic paroxysmal anxiety] without agoraphobia: Secondary | ICD-10-CM | POA: Diagnosis not present

## 2017-12-05 DIAGNOSIS — Z1231 Encounter for screening mammogram for malignant neoplasm of breast: Secondary | ICD-10-CM | POA: Diagnosis not present

## 2017-12-05 DIAGNOSIS — K219 Gastro-esophageal reflux disease without esophagitis: Secondary | ICD-10-CM

## 2017-12-05 DIAGNOSIS — Z7185 Encounter for immunization safety counseling: Secondary | ICD-10-CM

## 2017-12-05 DIAGNOSIS — Z780 Asymptomatic menopausal state: Secondary | ICD-10-CM

## 2017-12-05 DIAGNOSIS — F339 Major depressive disorder, recurrent, unspecified: Secondary | ICD-10-CM

## 2017-12-05 DIAGNOSIS — Z91199 Patient's noncompliance with other medical treatment and regimen due to unspecified reason: Secondary | ICD-10-CM

## 2017-12-05 DIAGNOSIS — Z9119 Patient's noncompliance with other medical treatment and regimen: Secondary | ICD-10-CM | POA: Insufficient documentation

## 2017-12-05 DIAGNOSIS — Z87891 Personal history of nicotine dependence: Secondary | ICD-10-CM | POA: Diagnosis not present

## 2017-12-05 DIAGNOSIS — K9049 Malabsorption due to intolerance, not elsewhere classified: Secondary | ICD-10-CM | POA: Diagnosis not present

## 2017-12-05 DIAGNOSIS — M810 Age-related osteoporosis without current pathological fracture: Secondary | ICD-10-CM

## 2017-12-05 DIAGNOSIS — Z1211 Encounter for screening for malignant neoplasm of colon: Secondary | ICD-10-CM

## 2017-12-05 DIAGNOSIS — Z7189 Other specified counseling: Secondary | ICD-10-CM | POA: Diagnosis not present

## 2017-12-05 DIAGNOSIS — Z0000 Encounter for general adult medical examination without abnormal findings: Secondary | ICD-10-CM

## 2017-12-05 LAB — LIPID PANEL
CHOLESTEROL TOTAL: 202 mg/dL — AB (ref 100–199)
Chol/HDL Ratio: 2.7 ratio (ref 0.0–4.4)
HDL: 76 mg/dL (ref >39)
LDL Calculated: 108 mg/dL — ABNORMAL HIGH (ref 0–99)
TRIGLYCERIDES: 88 mg/dL (ref 0–149)
VLDL Cholesterol Cal: 18 mg/dL (ref 5–40)

## 2017-12-05 LAB — BASIC METABOLIC PANEL
BUN/Creatinine Ratio: 30 — ABNORMAL HIGH (ref 12–28)
BUN: 41 mg/dL — ABNORMAL HIGH (ref 8–27)
CALCIUM: 9.5 mg/dL (ref 8.7–10.3)
CO2: 25 mmol/L (ref 20–29)
CREATININE: 1.36 mg/dL — AB (ref 0.57–1.00)
Chloride: 101 mmol/L (ref 96–106)
GFR calc non Af Amer: 40 mL/min/{1.73_m2} — ABNORMAL LOW (ref >59)
GFR, EST AFRICAN AMERICAN: 46 mL/min/{1.73_m2} — AB (ref >59)
Glucose: 92 mg/dL (ref 65–99)
Potassium: 4.8 mmol/L (ref 3.5–5.2)
Sodium: 140 mmol/L (ref 134–144)

## 2017-12-05 NOTE — Progress Notes (Addendum)
Subjective:    Rita Wright is a 67 y.o. female who presents for Preventative Services visit and chronic medical problems/med check visit.    Primary Care Provider Tysinger, Camelia Eng, PA-C here for primary care  Current Health Care Team:  Dentist - None  Eye doctor - [facility]  Cardiology  Dr Einar Gip  Dr. Zadie Rhine, Retina Specialist   Medical Services you may have received from other than [facility] providers in the past year (date may be approximate) [employer]  Exercise Current exercise habits: none   Nutrition/Diet Current diet: none  Depression Screen Depression screen [facility] 2/9 12/05/2017  Decreased Interest 0  Down, Depressed, Hopeless 0  PHQ - 2 Score 0  Altered sleeping -  Tired, decreased energy -  Change in appetite -  Feeling bad or failure about yourself  -  Trouble concentrating -  Moving slowly or fidgety/restless -  Suicidal thoughts -  PHQ-9 Score -  Difficult doing work/chores -    Activities of Daily Living Screen/Functional Status Survey Is the patient deaf or have difficulty hearing?: No Does the patient have difficulty seeing, even when wearing glasses/contacts?: No Does the patient have difficulty concentrating, remembering, or making decisions?: Yes Does the patient have difficulty walking or climbing stairs?: No Does the patient have difficulty dressing or bathing?: No Does the patient have difficulty doing errands alone such as visiting a doctor's office or shopping?: No    Fall Risk Screen Fall Risk  12/05/2017 09/19/2017 09/01/2016 10/20/2015 04/05/2014  Falls in the past year? No No No No No  Comment - - - Emmi Telephone Survey: data to providers prior to load -    Gait Assessment: Normal gait observed yes  Advanced directives Does patient have a [place]? No  Does patient have a Living Will? No   Past Medical History:  Diagnosis Date  . Colon cancer screening 09/2014   Cologuard test  negative   . Depression   . Dilated cardiomyopathy ([place]) 05/2014   Dr. Woody Seller  . Echocardiogram abnormal 05/2014   mild dilation of Left Ventricle; Dr. Woody Seller  . Hashimoto's thyroiditis   . History of mammogram 10/10/14   normal  . Multinodular goiter   . Noncompliance with diagnostic testing    due to severe anxiety  . Osteoporosis   . Panic disorder    severe, doesn't drive, doesn't use stairs or elevators, severely limited by her anxiety  . Renal insufficiency   . Thyroid nodule 5/91/63   benign follicular nodule on biopsy    Past Surgical History:  Procedure Laterality Date  . COLONOSCOPY  2014   cologuard 2016; refuses colonoscopy    Social History   Socioeconomic History  . Marital status: Married    Spouse name: Not on file  . Number of children: Not on file  . Years of education: Not on file  . Highest education level: Not on file  Occupational History  . Not on file  Social Needs  . Financial resource strain: Not on file  . Food insecurity:    Worry: Not on file    Inability: Not on file  . Transportation needs:    Medical: Not on file    Non-medical: Not on file  Tobacco Use  . Smoking status: Former Smoker    Types: Cigarettes    Last attempt to quit: 02/23/1992    Years since quitting: 25.8  . Smokeless tobacco: Never Used  Substance and Sexual Activity  .  Alcohol use: No  . Drug use: No  . Sexual activity: Not Currently  Lifestyle  . Physical activity:    Days per week: Not on file    Minutes per session: Not on file  . Stress: Not on file  Relationships  . Social connections:    Talks on phone: Not on file    Gets together: Not on file    Attends religious service: Not on file    Active member of club or organization: Not on file    Attends meetings of clubs or organizations: Not on file    Relationship status: Not on file  . Intimate partner violence:    Fear of current or ex partner: Not on file    Emotionally abused: Not on file     Physically abused: Not on file    Forced sexual activity: Not on file  Other Topics Concern  . Not on file  Social History Narrative   Married.  Exercise with walking her dog, gardening.       No family history on file.   Current Outpatient Medications:  .  cholecalciferol (VITAMIN D) 400 UNITS TABS, Take 400 Units by mouth., Disp: , Rfl:  .  folic acid (FOLVITE) 1 MG tablet, Take 1 tablet (1 mg total) by mouth daily., Disp: 90 tablet, Rfl: 2 .  Lactobacillus (PROBIOTIC ACIDOPHILUS PO), Take by mouth., Disp: , Rfl:  .  levothyroxine (SYNTHROID, LEVOTHROID) 100 MCG tablet, TAKE 1 TABLET BY MOUTH EVERY DAY BEFORE BREAKFAST, Disp: 90 tablet, Rfl: 3 .  metoprolol tartrate (LOPRESSOR) 25 MG tablet, TAKE 1 TABLET BY MOUTH 2 (TWO) TIMES DAILY FOR BP, Disp: 180 tablet, Rfl: 0 .  Rivaroxaban (XARELTO) 15 MG TABS tablet, Take 15 mg by mouth once., Disp: , Rfl:  .  aspirin 81 MG EC tablet, TAKE 1 TABLET BY MOUTH EVERY DAY (Patient not taking: Reported on 12/05/2017), Disp: 90 tablet, Rfl: 0  Allergies  Allergen Reactions  . Boniva [Ibandronic Acid]     Horrible joint pains  . Erythromycin     GI upset  . Prednisone Other (See Comments)    Jittery, insomnia, intolerance  . Penicillins Hives, Rash and Other (See Comments)    Felt like she was going to pass out.    History reviewed: allergies, current medications, past family history, past medical history, past social history, past surgical history and problem list  Chronic issues discussed: She recently saw cardiology, Dr. Irven Shelling office about atrial fibrillation.  Has been advised to do either cardioversion or ablation.  She is still weighing her options.  She is compliant with her medications including thyroid medicine, metoprolol, Xarelto.  She is noncompliant with screenings as her anxiety and panic disorder limits her following through with these test.  Her severe anxiety prevents her from using stairs or parking breaks or elevators or  any office this done on the first floor with easy to get in and out of facilities.  She does not drive, her husband takes her everywhere.  Past Medical History:  Diagnosis Date  . Colon cancer screening 09/2014   Cologuard test negative   . Depression   . Dilated cardiomyopathy ([place]) 05/2014   Dr. Woody Seller  . Echocardiogram abnormal 05/2014   mild dilation of Left Ventricle; Dr. Woody Seller  . Hashimoto's thyroiditis   . History of mammogram 10/10/14   normal  . Multinodular goiter   . Noncompliance with diagnostic testing    due to severe anxiety  . Osteoporosis   .  Panic disorder    severe, doesn't drive, doesn't use stairs or elevators, severely limited by her anxiety  . Renal insufficiency   . Thyroid nodule 2/68/34   benign follicular nodule on biopsy     Acute issues discussed: none  Objective:     Biometrics BP 120/76   Pulse 85   Temp 97.6 F (36.4 C) (Oral)   Resp 16   Ht 5\' 7"  (1.702 m)   Wt 159 lb 12.8 oz (72.5 kg)   LMP 02/23/1992   SpO2 95%   BMI 25.03 kg/m    Wt Readings from Last 3 Encounters:  12/05/17 159 lb 12.8 oz (72.5 kg)  09/29/17 159 lb 3.2 oz (72.2 kg)  09/19/17 159 lb 12.8 oz (72.5 kg)    Gen: wd, wn, nad, white female HEENT: normocephalic, sclerae anicteric, TMs pearly, nares patent, no discharge or erythema, pharynx normal Oral cavity: MMM, no lesions, teeth in good repair Neck: supple, no lymphadenopathy, no thyromegaly, no masses, no bruits Heart: irregular irregular, normal S1, S2, no murmurs Lungs: CTA bilaterally, no wheezes, rhonchi, or rales Abdomen: +bs, soft, non tender, non distended, no masses, no hepatomegaly, no splenomegaly Musculoskeletal: nontender, no swelling, no obvious deformity Extremities:  Pinkish toes bilat, but normal cap refilll, otherwise no edema, no cyanosis, no clubbing Pulses: 1+ symmetric, upper and lower extremities, normal cap refill Neurological: alert, oriented x 3, CN2-12 intact, strength normal upper  extremities and lower extremities, sensation normal throughout, DTRs 2+ throughout, no cerebellar signs, gait normal Psychiatric: somewhat anxious affect,  pleasant  Ext: no edema Breast/pap - deferred to gyn   Assessment:   Encounter Diagnoses  Name Primary?  . Hypothyroidism, unspecified type Yes  . Medicare annual wellness visit, subsequent   . Screening mammogram, encounter for   . Post-menopausal   . Panic disorder   . Generalized anxiety disorder   . Vaccine counseling   . Milk intolerance   . Former smoker   . Estrogen deficiency   . Depression, recurrent ([place])   . Osteoporosis, unspecified osteoporosis type, unspecified pathological fracture presence   . Atrial fibrillation, unspecified type ([place])   . Cardiomyopathy, unspecified type ([place])   . Gastroesophageal reflux disease without esophagitis   . Need for pneumococcal vaccination   . Need for influenza vaccination   . Screening for cancer   . Noncompliance with diagnostic testing   . Screening for lipid disorders   . Renal insufficiency   . Advanced directives, counseling/discussion      Plan:   A preventative services visit was completed today.  During the course of the visit today, we discussed and counseled about appropriate screening and preventive services.  A health risk assessment was established today that included a review of current medications, allergies, social history, family history, medical and preventative health history, biometrics, and preventative screenings to identify potential safety concerns or impairments.  A personalized plan was printed today for your records and use.   Personalized health advice and education was given today to reduce health risks and promote self management and wellness.  Information regarding end of life planning was discussed today.  Conditions/risks identified: Her anxiety is severe and significantly impacts her overall care and compliance.  We have discussed this  numerous times before, she is not currently seeing psychiatry or counselor and will not do so as she feels prior visits have been futile.  She declines medication as well.  She relies on her husband for simple basic things like going to  the grocery store driving to and from [place] appointments.  She will not go for imaging test or consult if is not on the first floor.  She has extreme anxiety with new things at heights with testing.  Thus she will not go to an appointment if it is up an elevator in a parking deck where she has to go above the ground floor  She is past due on all cancer screens.  We discussed this today and the need to update these testing.  I advised she call to see if any of the local [facility] or breast center locations have a first-floor testing facility  We will refer for Cologuard's cancer test  Advise she be referred to gynecology for abnormal Pap smear from 2014.  She will consider we discussed possible complications or risk of not doing a follow-up   Chronic problems discussed today: Hypothyroidism-continue thyroid medication  Atrial fibrillation-follow-up with cardiology.  Reviewed the recent September office note for the discuss her options including cardioversion or ablation or antiarrhythmic therapy.  She has follow-up with them soon  History of osteoporosis- due for bone density scan but up to now she has refused to go to get an updated bone density test.  She is taking vitamin D.  Acute problems discussed today: none  Recommendations:  I recommend a yearly ophthalmology/optometry visit for glaucoma screening and eye checkup  I recommended a yearly dental visit for hygiene and checkup  Advanced directives - discussed nature and purpose of Advanced Directives, encouraged them to complete them if they have not done so and/or encouraged them to get [place] a copy if they have done this already.  Referrals today: Cologuard  Immunizations: Counseled on the influenza  virus vaccine.  Vaccine information sheet given.   High dose Influenza vaccine given after consent obtained.   Counseled on the pneumococcal vaccine.  Vaccine information sheet given.  Pneumococcal vaccine PPSV23 given after consent obtained.   Shingles vaccine:  I recommend you have a shingles vaccine to help prevent shingles or herpes zoster outbreak.   Please call your insurer to inquire about coverage for the Shingrix vaccine given in 2 doses.   Some insurers cover this vaccine after age 80, some cover this after age 86.  If your insurer covers this, then call to schedule appointment to have this vaccine here.  She is up-to-date on tetanus booster  Kahmya was seen today for medcheck +.  Diagnoses and all orders for this visit:  Hypothyroidism, unspecified type -     Basic metabolic panel  Medicare annual wellness visit, subsequent  Screening mammogram, encounter for -     MM DIGITAL SCREENING BILATERAL; Future  Post-menopausal  Panic disorder  Generalized anxiety disorder  Vaccine counseling  Milk intolerance  Former smoker  Estrogen deficiency  Depression, recurrent ([place])  Osteoporosis, unspecified osteoporosis type, unspecified pathological fracture presence  Atrial fibrillation, unspecified type ([place])  Cardiomyopathy, unspecified type ([place]) -     Lipid panel  Gastroesophageal reflux disease without esophagitis  Need for pneumococcal vaccination  Need for influenza vaccination  Screening for cancer  Noncompliance with diagnostic testing  Screening for lipid disorders -     Lipid panel  Renal insufficiency  Advanced directives, counseling/discussion    Medicare Attestation A preventative services visit was completed today.  During the course of the visit the patient was educated and counseled about appropriate screening and preventive services.  A health risk assessment was established with the patient that included a review of  current  medications, allergies, social history, family history, medical and preventative health history, biometrics, and preventative screenings to identify potential safety concerns or impairments.  A personalized plan was printed today for the patient's records and use.   Personalized health advice and education was given today to reduce health risks and promote self management and wellness.  Information regarding end of life planning was discussed today.  Dorothea Ogle, PA-C   12/05/2017

## 2017-12-05 NOTE — Addendum Note (Signed)
Addended by: Gwinda Maine on: 12/05/2017 01:16 PM   Modules accepted: Orders

## 2017-12-05 NOTE — Patient Instructions (Addendum)
We saw you for a Medicare wellness and physical visit today  Cancer screening: You are past due on all cancer screenings  I recommend you call and schedule a mammogram.  Please call 1 of the following breast centers and inquire about location and whether or not they have a first-floor screening center since you have significant anxiety.  You should have a mammogram yearly   [facility]  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  At that time it was recommended you see gynecology for further evaluation.  Since is been 3 years I recommend updated Pap smear at this time.  Plan to return here for Pap smear at your convenience or let me know if you are agreeable to go ahead and refer to gynecology   We will make a referral for Cologuard testing.  This is a screening for colon cancer.  You should receive a box in the mail with instructions on how to return the stool sample   We will call with lab results   See your eye doctor yearly for routine vision care.   Follow-up with Dr. Einar Gip as planned  Shingles vaccine:  I recommend you have a shingles vaccine to help prevent shingles or herpes zoster outbreak.   Please call your insurer to inquire about coverage for the Shingrix vaccine given in 2 doses.   Some insurers cover this vaccine after age 41, some cover this after age 23.  If your insurer covers this, then call to schedule appointment to have this vaccine here.   I recommend you and your husband work on completing your Advanced Directives as well as your living will and healthcare power of attorney documents

## 2017-12-05 NOTE — Addendum Note (Signed)
Addended by: Gwinda Maine on: 12/05/2017 03:21 PM   Modules accepted: Orders

## 2017-12-06 ENCOUNTER — Encounter: Payer: Self-pay | Admitting: Medical

## 2017-12-12 ENCOUNTER — Encounter: Payer: Self-pay | Admitting: Medical

## 2017-12-14 DIAGNOSIS — I428 Other cardiomyopathies: Secondary | ICD-10-CM | POA: Diagnosis not present

## 2017-12-14 DIAGNOSIS — I4819 Other persistent atrial fibrillation: Secondary | ICD-10-CM | POA: Diagnosis not present

## 2017-12-14 DIAGNOSIS — Z8639 Personal history of other endocrine, nutritional and metabolic disease: Secondary | ICD-10-CM | POA: Diagnosis not present

## 2017-12-14 DIAGNOSIS — E785 Hyperlipidemia, unspecified: Secondary | ICD-10-CM | POA: Diagnosis not present

## 2017-12-26 ENCOUNTER — Other Ambulatory Visit: Payer: Self-pay

## 2017-12-26 ENCOUNTER — Telehealth: Payer: Self-pay | Admitting: Medical

## 2017-12-26 DIAGNOSIS — Z129 Encounter for screening for malignant neoplasm, site unspecified: Secondary | ICD-10-CM

## 2017-12-26 NOTE — Telephone Encounter (Signed)
Pt called wanting to know if cologuard has been approved through insurance because she received a letter from insurance stating it would cost [phone] then another letter stating it was covered. Call pt at home #

## 2017-12-26 NOTE — Telephone Encounter (Signed)
Spoke with patient and told her to call her insurance co to make sure they will cover the Cologuard

## 2017-12-26 NOTE — Telephone Encounter (Signed)
Patient stated she spoke with her insurance and they state they would cover her Cologuard. Insurance stated the Colorguard needs to be filed again.

## 2017-12-26 NOTE — Telephone Encounter (Signed)
Done reordered cologuard

## 2018-01-04 DIAGNOSIS — Z1211 Encounter for screening for malignant neoplasm of colon: Secondary | ICD-10-CM | POA: Diagnosis not present

## 2018-01-04 DIAGNOSIS — Z1212 Encounter for screening for malignant neoplasm of rectum: Secondary | ICD-10-CM | POA: Diagnosis not present

## 2018-01-09 LAB — COLOGUARD: Cologuard: POSITIVE

## 2018-01-27 ENCOUNTER — Telehealth: Payer: Self-pay | Admitting: Medical

## 2018-01-27 NOTE — Telephone Encounter (Signed)
Cologard test was +.   Please refer to GI for further eval.  She has had colonoscopy before, so refer to same gastroenterology.

## 2018-01-30 ENCOUNTER — Other Ambulatory Visit: Payer: Self-pay

## 2018-01-30 DIAGNOSIS — R195 Other fecal abnormalities: Secondary | ICD-10-CM

## 2018-01-30 NOTE — Telephone Encounter (Signed)
Patient notified and is ok to be referred to GI.

## 2018-02-08 DIAGNOSIS — H2513 Age-related nuclear cataract, bilateral: Secondary | ICD-10-CM | POA: Diagnosis not present

## 2018-02-08 DIAGNOSIS — D3131 Benign neoplasm of right choroid: Secondary | ICD-10-CM | POA: Diagnosis not present

## 2018-02-09 ENCOUNTER — Other Ambulatory Visit: Payer: Self-pay

## 2018-02-13 DIAGNOSIS — J449 Chronic obstructive pulmonary disease, unspecified: Secondary | ICD-10-CM | POA: Diagnosis not present

## 2018-02-13 DIAGNOSIS — I482 Chronic atrial fibrillation, unspecified: Secondary | ICD-10-CM | POA: Diagnosis not present

## 2018-02-13 DIAGNOSIS — R195 Other fecal abnormalities: Secondary | ICD-10-CM | POA: Diagnosis not present

## 2018-03-18 ENCOUNTER — Other Ambulatory Visit: Payer: Self-pay | Admitting: Cardiology

## 2018-03-18 DIAGNOSIS — I4819 Other persistent atrial fibrillation: Secondary | ICD-10-CM

## 2018-03-18 DIAGNOSIS — I428 Other cardiomyopathies: Secondary | ICD-10-CM

## 2018-04-05 ENCOUNTER — Ambulatory Visit: Payer: Medicare Other

## 2018-04-05 DIAGNOSIS — I4819 Other persistent atrial fibrillation: Secondary | ICD-10-CM

## 2018-04-05 DIAGNOSIS — I428 Other cardiomyopathies: Secondary | ICD-10-CM | POA: Diagnosis not present

## 2018-04-10 ENCOUNTER — Other Ambulatory Visit: Payer: Self-pay | Admitting: Medical

## 2018-04-10 DIAGNOSIS — E039 Hypothyroidism, unspecified: Secondary | ICD-10-CM

## 2018-04-10 DIAGNOSIS — E2839 Other primary ovarian failure: Secondary | ICD-10-CM

## 2018-04-11 ENCOUNTER — Telehealth: Payer: Self-pay

## 2018-04-18 ENCOUNTER — Encounter: Payer: Self-pay | Admitting: Cardiology

## 2018-04-18 ENCOUNTER — Ambulatory Visit (INDEPENDENT_AMBULATORY_CARE_PROVIDER_SITE_OTHER): Payer: Medicare Other | Admitting: Cardiology

## 2018-04-18 VITALS — BP 110/78 | HR 73 | Ht 67.0 in | Wt 156.7 lb

## 2018-04-18 DIAGNOSIS — I420 Dilated cardiomyopathy: Secondary | ICD-10-CM

## 2018-04-18 DIAGNOSIS — N183 Chronic kidney disease, stage 3 unspecified: Secondary | ICD-10-CM

## 2018-04-18 DIAGNOSIS — I4821 Permanent atrial fibrillation: Secondary | ICD-10-CM

## 2018-04-18 NOTE — Progress Notes (Signed)
Subjective:   Rita Wright, female    DOB: 28-Jan-1951, 68 y.o.   MRN: [id number]  Rita Hurl, PA-C:  No chief complaint on file.   HPI: Rita Wright  is a 68 y.o. female  with hashimoto's thyroiditis, osteoporosis, and atrial fibrillation.   She was found to have Atrial fibrillation in July in 2019. She underwent lexiscan nuclear stress test on 09/05/2017 him and was considered high risk study due to reduced LVEF. She then underwent echocardiogram on 10/05/2017 revealing moderate decrease in overall wall motion with EF of 30-35%. Noted to have left and right atrial dilation. As patient is very anxious regarding procedures and medications, per her wishes, she did not have cardioversion, ablation, or try antiarrhythmic therapy. She is on rate control therapy with Metoprolol 50mg  BID that she is tolerating well. Underwent echo and presents to discuss results.  She continues to feel overall pretty well. Has days where she feels better than others with days of worsening fatigue. Does report occasional brief episodes of strange feeling in her stomach and need to take a deep breath. Heart rate has ranged from upper 40's to low 100's on her apple watch. Denies any chest pain or worsening shortness of breath. No PND or orthopnea. Rarely has ankle edema.   Reports that she was scheduled for colonoscopy; however, the day of procedure was cancelled in view of her CHF and needing to be performed in the hospital. She is now questioning if she wants to have the procedure. She denies any blood in her stool. Did have positive cologuard test.    She did previsously smoke. Husband present at bedside.   Past Medical History:  Diagnosis Date  . Colon cancer screening 09/2014   Cologuard test negative   . Depression   . Dilated cardiomyopathy ([place]) 05/2014   Dr. Woody Seller  . Echocardiogram abnormal 05/2014   mild dilation of Left Ventricle; Dr. Woody Seller  . Hashimoto's thyroiditis   . History of  mammogram 10/10/14   normal  . Multinodular goiter   . Noncompliance with diagnostic testing    due to severe anxiety  . Osteoporosis   . Panic disorder    severe, doesn't drive, doesn't use stairs or elevators, severely limited by her anxiety  . Renal insufficiency   . Thyroid nodule 5/36/46   benign follicular nodule on biopsy    Past Surgical History:  Procedure Laterality Date  . COLONOSCOPY  2014   cologuard 2016; refuses colonoscopy    No family history on file.  Social History   Socioeconomic History  . Marital status: Married    Spouse name: Not on file  . Number of children: Not on file  . Years of education: Not on file  . Highest education level: Not on file  Occupational History  . Not on file  Social Needs  . Financial resource strain: Not on file  . Food insecurity:    Worry: Not on file    Inability: Not on file  . Transportation needs:    Medical: Not on file    Non-medical: Not on file  Tobacco Use  . Smoking status: Former Smoker    Types: Cigarettes    Last attempt to quit: 02/23/1992    Years since quitting: 26.1  . Smokeless tobacco: Never Used  Substance and Sexual Activity  . Alcohol use: No  . Drug use: No  . Sexual activity: Not Currently  Lifestyle  . Physical activity:  Days per week: Not on file    Minutes per session: Not on file  . Stress: Not on file  Relationships  . Social connections:    Talks on phone: Not on file    Gets together: Not on file    Attends religious service: Not on file    Active member of club or organization: Not on file    Attends meetings of clubs or organizations: Not on file    Relationship status: Not on file  . Intimate partner violence:    Fear of current or ex partner: Not on file    Emotionally abused: Not on file    Physically abused: Not on file    Forced sexual activity: Not on file  Other Topics Concern  . Not on file  Social History Narrative   Married.  Exercise with walking her  dog, gardening.       No outpatient medications have been marked as taking for the 04/18/18 encounter (Appointment) with Miquel Dunn, NP.     Review of Systems  Constitution: Positive for malaise/fatigue. Negative for decreased appetite, weight gain and weight loss.  Eyes: Negative for visual disturbance.  Cardiovascular: Positive for dyspnea on exertion (over exertion) and palpitations. Negative for chest pain, claudication, leg swelling, orthopnea, paroxysmal nocturnal dyspnea (occasional) and syncope.  Respiratory: Negative for hemoptysis and wheezing.   Endocrine: Negative for cold intolerance and heat intolerance.  Hematologic/Lymphatic: Does not bruise/bleed easily.  Skin: Negative for nail changes.  Musculoskeletal: Negative for muscle weakness and myalgias.  Gastrointestinal: Negative for abdominal pain, change in bowel habit, nausea and vomiting.  Neurological: Negative for difficulty with concentration, dizziness, focal weakness and headaches.  Psychiatric/Behavioral: Positive for depression. Negative for altered mental status and suicidal ideas. The patient is nervous/anxious.   All other systems reviewed and are negative.      Objective:    Blood pressure 110/78, pulse 73, height 5\' 7"  (1.702 m), weight 156 lb 11.2 oz (71.1 kg), last menstrual period 02/23/1992, SpO2 94 %.   Last menstrual period 02/23/1992.  Echocardiogram 04/05/2018: Left ventricle cavity is mildly dilated. Moderate decrease in global wall motion. Visual EF is 30-35%. Unable to evaluate diastolic function due to A. Fibrillation. Calculated EF 39%. Left atrial cavity is moderately dilated. Right atrial cavity is mildly dilated. Moderate (Grade II) mitral regurgitation. Moderate, eccentric tricuspid regurgitation. Estimated pulmonary artery systolic pressure 19 mmHg. Insignificant pericardial effusion. No significant change compared to previous study on 10/05/2017.  Lexiscan myoview stress  test 09/05/2017: 1. Lexiscan stress test was performed. Exercise capacity was not assessed. Stress symptoms included headache, "panic like symptoms". Peak blood pressure was 162/102 mmHg. The resting and stress electrocardiogram demonstrated atrial fibrillation with rapid ventricular response, RBBB + LAHB, no resting arrhythmias and normal rest repolarization. Stress EKG is non diagnostic for ischemia as it is a pharmacologic stress. 2. The overall quality of the study is good. Left ventricular cavity is noted to be normal on the rest and stress studies. Gated SPECT images reveal global reduction in normal myocardial thickening and wall motion. The left ventricular ejection fraction was calculated or visually estimated to be 26%. LVEF could be underestimated due to gating difficulties during Afib w/RVR. Review of the raw data in a rotational cine format reveals breast attenuation with imaging performed in sitting position. REST and STRESS images demonstrate small sized area of mildly decreased tracer uptake in the basal inferoseptal, basal inferior, mid inferoseptal and mid inferior segments of the left ventricle with  no reversibility. Findings most likely represent breast attenuation artifact. 3. High risk study due to reduced LVEF. Recommend echocardiogram, preferably with controlled ventricular rate, for accurate LVEF estimation.   Physical Exam  Constitutional: She is oriented to person, place, and time. Vital signs are normal. She appears well-developed and well-nourished.  HENT:  Head: Normocephalic and atraumatic.  Neck: Normal range of motion.  Cardiovascular: Normal rate, normal heart sounds and intact distal pulses. An irregularly irregular rhythm present.  Pulmonary/Chest: Effort normal and breath sounds normal. No accessory muscle usage. No respiratory distress.  Abdominal: Soft. Bowel sounds are normal.  Musculoskeletal: Normal range of motion.  Neurological: She is alert and oriented to  person, place, and time.  Skin: Skin is warm and dry.  Vitals reviewed.          Assessment & Recommendations:   1. Permanent atrial fibrillation Continues to be rate controlled with Metoprolol 50mg  BID and is tolerating this well. She does not wish to try any procedures or antiarrhythmic therapy. Except for mild fatigue, essentially asymptomatic. Complaints of occasional palpitations may be related to PVC's. Encouraged her to continue to monitor and notify me for any worsening. Could consider placing her on a monitor for further evaluation. Has not had any significant RVR episodes by her apple watch. She is Xarelto 15mg  daily without any bleeding diathesis. Encouraged her to proceed with colonoscopy due to positive cologuard. It does not appear that she has had recent CBC, would recommend obtaining this due to being on anticoagulation. Would recommend CBC every 6 months.   2. Dilated cardiomyopathy ([place]) No clinical evidence of heart failure. Echocardiogram results were discussed with the patient. LVEF has remained stable. Atrial fibrillation likely etiology. She is aware of risk of acute exacerbations of heart failure and warning signs of this. Understands that her EF will likely not improve without correcting A fib or without medical therapy. She is not interested in antiarrhythmic therapy or heart failure medications. Her blood pressure is generally soft.   3. Chronic kidney disease (CKD), stage III (moderate) ([place]) Stable and was last checked in Oct 2019. Will be checked by her PCP at next follow up.   Overall, patient is doing well and no clinical evidence of heart failure. Will continue to monitor. I will see her back in 6 months or sooner if problems. She is aware to contact me for any concerns.   Jeri Lager, FNP-C [facility], [place] Office: [phone] Fax: [phone]

## 2018-05-08 ENCOUNTER — Other Ambulatory Visit: Payer: Self-pay | Admitting: Medical

## 2018-05-08 ENCOUNTER — Telehealth: Payer: Self-pay | Admitting: Medical

## 2018-05-08 DIAGNOSIS — E039 Hypothyroidism, unspecified: Secondary | ICD-10-CM

## 2018-05-08 DIAGNOSIS — E2839 Other primary ovarian failure: Secondary | ICD-10-CM

## 2018-05-08 MED ORDER — FOLIC ACID 1 MG PO TABS: 1.0000 mg | ORAL_TABLET | Freq: Every day | ORAL | 3 refills | Status: DC

## 2018-05-08 NOTE — Telephone Encounter (Signed)
Pt called and is requesting a refill on her Folic acitd pt needs it sent to the [facility] - [place], [place] - [address] pt can be reached at [phone]

## 2018-05-08 NOTE — Telephone Encounter (Signed)
Is this ok to refill?  

## 2018-05-09 ENCOUNTER — Other Ambulatory Visit: Payer: Self-pay

## 2018-05-09 MED ORDER — RIVAROXABAN 15 MG PO TABS: 15.0000 mg | ORAL_TABLET | Freq: Once | ORAL | 0 refills | Status: DC

## 2018-05-15 ENCOUNTER — Other Ambulatory Visit: Payer: Self-pay

## 2018-05-15 ENCOUNTER — Telehealth: Payer: Self-pay

## 2018-05-15 DIAGNOSIS — I4891 Unspecified atrial fibrillation: Secondary | ICD-10-CM

## 2018-05-15 MED ORDER — RIVAROXABAN 15 MG PO TABS: 15.0000 mg | ORAL_TABLET | Freq: Once | ORAL | 3 refills | Status: DC

## 2018-05-16 NOTE — Telephone Encounter (Signed)
Error message

## 2018-06-15 ENCOUNTER — Encounter: Payer: Self-pay | Admitting: Medical

## 2018-10-10 ENCOUNTER — Other Ambulatory Visit: Payer: Self-pay | Admitting: Medical

## 2018-10-10 ENCOUNTER — Telehealth: Payer: Self-pay | Admitting: Medical

## 2018-10-10 DIAGNOSIS — E039 Hypothyroidism, unspecified: Secondary | ICD-10-CM

## 2018-10-10 MED ORDER — LEVOTHYROXINE SODIUM 100 MCG PO TABS: ORAL_TABLET | ORAL | 0 refills | Status: DC

## 2018-10-10 NOTE — Telephone Encounter (Signed)
Pt left message needs refill Levothyroxine to mail order

## 2018-10-10 NOTE — Telephone Encounter (Signed)
I sent the refill but according to refills in the chart she should need a refill as she there are refills already on file

## 2018-10-17 ENCOUNTER — Other Ambulatory Visit: Payer: Self-pay

## 2018-10-17 ENCOUNTER — Encounter: Payer: Self-pay | Admitting: Cardiology

## 2018-10-17 ENCOUNTER — Ambulatory Visit (INDEPENDENT_AMBULATORY_CARE_PROVIDER_SITE_OTHER): Payer: Medicare Other | Admitting: Cardiology

## 2018-10-17 VITALS — BP 119/91 | HR 83 | Temp 96.7°F

## 2018-10-17 DIAGNOSIS — I7389 Other specified peripheral vascular diseases: Secondary | ICD-10-CM

## 2018-10-17 DIAGNOSIS — I428 Other cardiomyopathies: Secondary | ICD-10-CM | POA: Diagnosis not present

## 2018-10-17 DIAGNOSIS — I4821 Permanent atrial fibrillation: Secondary | ICD-10-CM | POA: Diagnosis not present

## 2018-10-17 DIAGNOSIS — Z87891 Personal history of nicotine dependence: Secondary | ICD-10-CM | POA: Diagnosis not present

## 2018-10-17 DIAGNOSIS — I4891 Unspecified atrial fibrillation: Secondary | ICD-10-CM

## 2018-10-17 MED ORDER — VALSARTAN 80 MG PO TABS: 80.0000 mg | ORAL_TABLET | Freq: Every day | ORAL | 1 refills | Status: DC

## 2018-10-17 NOTE — Progress Notes (Signed)
Primary Physician:  Carlena Hurl, PA-C   Patient ID: Rita Wright, female    DOB: [birth year], 68 y.o.   MRN: [id number]  Subjective:    Chief Complaint  Patient presents with  . Atrial Fibrillation  . Follow-up    31mo    HPI: Rita Wright  is a 68 y.o. female  with hashimoto's thyroiditis, osteoporosis, former tobacco use, and atrial fibrillation.   She was found to have Atrial fibrillation in July in 2019. She underwent lexiscan nuclear stress test on 09/05/2017 him and was considered high risk study due to reduced LVEF. She then underwent echocardiogram on 04/05/2018 revealing stable, but continued depressed LVEF 39%. As patient is very anxious regarding procedures and medications, per her wishes , she did not have cardioversion, ablation, or try antiarrhythmic therapy. She also did not want to try heart failure medications. She is on rate control therapy with Metoprolol 50mg  BID that she is tolerating well. She is now here for 6 month follow up.  Patient reports that she is doing well. Heart rate has been stable, with only occasional elevations noted in the 120's that are asymptomatic. She is tolerating medications well. Denies any dyspnea on exertion or chest pain. She does report 2 episodes where one of her fingers will turn white. No ulcerations. No symptoms of claudications. No PND or orthopnea. Rarely has ankle edema.   Reports that she was scheduled for colonoscopy; however, the day of procedure was cancelled in view of her CHF and needing to be performed in the hospital. She now does not want to have the colonoscopy. States that she was having stomach issues at that time, and they have now resolved. She denies any blood in her stool. Did have positive cologuard test.     Past Medical History:  Diagnosis Date  . Colon cancer screening 09/2014   Cologuard test negative   . Depression   . Dilated cardiomyopathy ([place]) 05/2014   Dr. Woody Seller  . Echocardiogram abnormal  05/2014   mild dilation of Left Ventricle; Dr. Woody Seller  . Hashimoto's thyroiditis   . History of mammogram 10/10/14   normal  . Multinodular goiter   . Noncompliance with diagnostic testing    due to severe anxiety  . Osteoporosis   . Panic disorder    severe, doesn't drive, doesn't use stairs or elevators, severely limited by her anxiety  . Renal insufficiency   . Thyroid nodule [id number]   benign follicular nodule on biopsy    Past Surgical History:  Procedure Laterality Date  . COLONOSCOPY  2014   cologuard 2016; refuses colonoscopy  . TUBAL LIGATION  1986    Social History   Socioeconomic History  . Marital status: Married    Spouse name: Not on file  . Number of children: 2  . Years of education: Not on file  . Highest education level: Not on file  Occupational History  . Not on file  Social Needs  . Financial resource strain: Not on file  . Food insecurity    Worry: Not on file    Inability: Not on file  . Transportation needs    Medical: Not on file    Non-medical: Not on file  Tobacco Use  . Smoking status: Former Smoker    Packs/day: 1.50    Years: 35.00    Pack years: 52.50    Types: Cigarettes    Quit date: 02/23/1992    Years since quitting: 26.6  . Smokeless  tobacco: Never Used  Substance and Sexual Activity  . Alcohol use: No  . Drug use: No  . Sexual activity: Not Currently  Lifestyle  . Physical activity    Days per week: Not on file    Minutes per session: Not on file  . Stress: Not on file  Relationships  . Social Herbalist on phone: Not on file    Gets together: Not on file    Attends religious service: Not on file    Active member of club or organization: Not on file    Attends meetings of clubs or organizations: Not on file    Relationship status: Not on file  . Intimate partner violence    Fear of current or ex partner: Not on file    Emotionally abused: Not on file    Physically abused: Not on file    Forced sexual  activity: Not on file  Other Topics Concern  . Not on file  Social History Narrative   Married.  Exercise with walking her dog, gardening.       Review of Systems  Constitution: Negative for decreased appetite, malaise/fatigue, weight gain and weight loss.  Eyes: Negative for visual disturbance.  Cardiovascular: Negative for chest pain, claudication, dyspnea on exertion (over exertion), leg swelling, orthopnea, palpitations, paroxysmal nocturnal dyspnea (occasional) and syncope.  Respiratory: Negative for hemoptysis and wheezing.   Endocrine: Negative for cold intolerance and heat intolerance.  Hematologic/Lymphatic: Does not bruise/bleed easily.  Skin: Negative for nail changes.  Musculoskeletal: Negative for muscle weakness and myalgias.  Gastrointestinal: Negative for abdominal pain, change in bowel habit, nausea and vomiting.  Neurological: Negative for difficulty with concentration, dizziness, focal weakness and headaches.  Psychiatric/Behavioral: Positive for depression. Negative for altered mental status and suicidal ideas. The patient is nervous/anxious.   All other systems reviewed and are negative.     Objective:  Blood pressure (!) 119/91, pulse 83, temperature (!) 96.7 F (35.9 C), last menstrual period 02/23/1992, SpO2 95 %. There is no height or weight on file to calculate BMI.    Physical Exam  Constitutional: She is oriented to person, place, and time. Vital signs are normal. She appears well-developed and well-nourished.  HENT:  Head: Normocephalic and atraumatic.  Neck: Normal range of motion.  Cardiovascular: Normal rate, normal heart sounds and intact distal pulses. An irregularly irregular rhythm present.  Pulses:      Femoral pulses are 1+ on the right side and 1+ on the left side.      Popliteal pulses are 1+ on the right side and 1+ on the left side.       Dorsalis pedis pulses are 1+ on the right side and 1+ on the left side.       Posterior tibial pulses  are 0 on the right side and 0 on the left side.  Acrocyanosis bilaterally  Pulmonary/Chest: Effort normal and breath sounds normal. No accessory muscle usage. No respiratory distress.  Abdominal: Soft. Bowel sounds are normal.  Musculoskeletal: Normal range of motion.  Neurological: She is alert and oriented to person, place, and time.  Skin: Skin is warm and dry.  Vitals reviewed.  Radiology: No results found.  Laboratory examination:    CMP Latest Ref Rng & Units 12/05/2017 08/22/2017 09/01/2016  Glucose 65 - 99 mg/dL 92 93 78  BUN 8 - 27 mg/dL 41(H) 44(H) 38(H)  Creatinine 0.57 - 1.00 mg/dL 1.36(H) 1.30(H) 0.87  Sodium 134 - 144 mmol/L 140  143 141  Potassium 3.5 - 5.2 mmol/L 4.8 5.2 4.5  Chloride 96 - 106 mmol/L 101 102 105  CO2 20 - 29 mmol/L 25 26 26   Calcium 8.7 - 10.3 mg/dL 9.5 9.7 9.2  Total Protein 6.1 - 8.1 g/dL - - 6.5  Total Bilirubin 0.2 - 1.2 mg/dL - - 0.7  Alkaline Phos 33 - 130 U/L - - 77  AST 10 - 35 U/L - - 23  ALT 6 - 29 U/L - - 29   CBC Latest Ref Rng & Units 09/14/2017 08/22/2017 09/01/2016  WBC 3.4 - 10.8 x10E3/uL 6.3 6.4 5.0  Hemoglobin 11.1 - 15.9 g/dL 15.5 17.0(H) 15.9(H)  Hematocrit 34.0 - 46.6 % 48.2(H) 50.9(H) 48.5(H)  Platelets 150 - 450 x10E3/uL 178 191 170   Lipid Panel     Component Value Date/Time   CHOL 202 (H) 12/05/2017 1107   TRIG 88 12/05/2017 1107   HDL 76 12/05/2017 1107   CHOLHDL 2.7 12/05/2017 1107   CHOLHDL 2.5 09/01/2016 0818   VLDL 12 09/01/2016 0818   LDLCALC 108 (H) 12/05/2017 1107   HEMOGLOBIN A1C No results found for: HGBA1C, MPG TSH No results for input(s): TSH in the last 8760 hours.  PRN Meds:. Medications Discontinued During This Encounter  Medication Reason  . aspirin 81 MG EC tablet Change in therapy  . Lactobacillus (PROBIOTIC ACIDOPHILUS PO) Patient Preference  . metoprolol tartrate (LOPRESSOR) 25 MG tablet Change in therapy   Current Meds  Medication Sig  . cholecalciferol (VITAMIN D) 400 UNITS TABS Take  400 Units by mouth.  . folic acid (FOLVITE) 1 MG tablet Take 1 tablet (1 mg total) by mouth daily.  Marland Kitchen levothyroxine (SYNTHROID) 100 MCG tablet TAKE 1 TABLET BY MOUTH EVERY DAY BEFORE BREAKFAST  . metoprolol tartrate (LOPRESSOR) 50 MG tablet Take 50 mg by mouth 2 (two) times daily.  . Rivaroxaban (XARELTO) 15 MG TABS tablet Take 1 tablet (15 mg total) by mouth once for 1 dose.  . [DISCONTINUED] metoprolol tartrate (LOPRESSOR) 25 MG tablet TAKE 1 TABLET BY MOUTH 2 (TWO) TIMES DAILY FOR BP    Cardiac Studies:   Echocardiogram 04/05/2018: Left ventricle cavity is mildly dilated. Moderate decrease in global wall motion. Visual EF is 30-35%. Unable to evaluate diastolic function due to A. Fibrillation. Calculated EF 39%. Left atrial cavity is moderately dilated. Right atrial cavity is mildly dilated. Moderate (Grade II) mitral regurgitation. Moderate, eccentric tricuspid regurgitation. Estimated pulmonary artery systolic pressure 19 mmHg. Insignificant pericardial effusion. No significant change compared to previous study on 10/05/2017.  Lexiscan myoview stress test 09/05/2017: 1. Lexiscan stress test was performed. Exercise capacity was not assessed. Stress symptoms included headache, "panic like symptoms". Peak blood pressure was 162/102 mmHg. The resting and stress electrocardiogram demonstrated atrial fibrillation with rapid ventricular response, RBBB + LAHB, no resting arrhythmias and normal rest repolarization. Stress EKG is non diagnostic for ischemia as it is a pharmacologic stress. 2. The overall quality of the study is good. Left ventricular cavity is noted to be normal on the rest and stress studies. Gated SPECT images reveal global reduction in normal myocardial thickening and wall motion. The left ventricular ejection fraction was calculated or visually estimated to be 26%. LVEF could be underestimated due to gating difficulties during Afib w/RVR. Review of the raw data in a rotational  cine format reveals breast attenuation with imaging performed in sitting position. REST and STRESS images demonstrate small sized area of mildly decreased tracer uptake in the basal inferoseptal, basal  inferior, mid inferoseptal and mid inferior segments of the left ventricle with no reversibility. Findings most likely represent breast attenuation artifact. 3. High risk study due to reduced LVEF. Recommend echocardiogram, preferably with controlled ventricular rate, for accurate LVEF estimation.  Assessment:     ICD-10-CM   1. Atrial fibrillation with controlled ventricular response (HCC)  I48.91 EKG 12-Lead  2. Nonischemic cardiomyopathy (HCC)  99991111 Basic metabolic panel    Basic metabolic panel  3. Acrocyanosis ([place])  I73.89 PCV LOWER ARTERIAL (BILATERAL)  4. Former tobacco use  Z87.891 PCV LOWER ARTERIAL (BILATERAL)    EKG 10/17/2018: Atrial fibrillation at 90 bpm, left axis deviation, PRWP cannot exclude anterior infarct old. No evidence of ischemia.   Recommendations:   Patient is here for 25-month office visit and follow-up for A. fib and nonischemic cardiomyopathy.  She continues to remain rate controlled with 50 mg of metoprolol.  Denies any heart racing or palpitations.  Overall her heart rate has been stable by her heart watch.  She has not wanted to try ablation, cardioversion, or antiarrhythmic therapy.  She is very fearful and anxious regarding medications and procedures.  For  She denies any chest pain or shortness of breath, she does have dilated nonischemic cardiomyopathy likely related to atrial fibrillation.  She is currently not on any heart failure medications per patient wishes.  I discussed risk of decompensated heart failure and treatment options.  After further discussion, she is willing to take low-dose valsartan.  Will check BMP in 10 days for surveillance of kidney function.  In view of lack of symptoms, have not started her on Entresto.  She does have evidence of  acrocyanosis on physical exam and does report occasional numbness sensation and cold sensation in her feet.  She is a former heavy tobacco user for approximately 30 years.  Will obtain arterial duplex for further evaluation.  I will see her back in approximately 8 weeks for follow-up on her tolerance to valsartan and also to discuss lower extremity duplex results.  Miquel Dunn, MSN, APRN, FNP-C [facility]. [place] Office: [phone] Fax: [phone]

## 2018-10-26 DIAGNOSIS — I428 Other cardiomyopathies: Secondary | ICD-10-CM | POA: Diagnosis not present

## 2018-10-27 LAB — BASIC METABOLIC PANEL
BUN/Creatinine Ratio: 42 — ABNORMAL HIGH (ref 12–28)
BUN: 44 mg/dL — ABNORMAL HIGH (ref 8–27)
CO2: 25 mmol/L (ref 20–29)
Calcium: 9.1 mg/dL (ref 8.7–10.3)
Chloride: 103 mmol/L (ref 96–106)
Creatinine, Ser: 1.04 mg/dL — ABNORMAL HIGH (ref 0.57–1.00)
GFR calc Af Amer: 63 mL/min/{1.73_m2} (ref >59)
GFR calc non Af Amer: 55 mL/min/{1.73_m2} — ABNORMAL LOW (ref >59)
Glucose: 109 mg/dL — ABNORMAL HIGH (ref 65–99)
Potassium: 4.5 mmol/L (ref 3.5–5.2)
Sodium: 140 mmol/L (ref 134–144)

## 2018-11-03 ENCOUNTER — Ambulatory Visit (INDEPENDENT_AMBULATORY_CARE_PROVIDER_SITE_OTHER): Payer: Medicare Other

## 2018-11-03 ENCOUNTER — Other Ambulatory Visit: Payer: Self-pay

## 2018-11-03 DIAGNOSIS — Z87891 Personal history of nicotine dependence: Secondary | ICD-10-CM

## 2018-11-03 DIAGNOSIS — I7389 Other specified peripheral vascular diseases: Secondary | ICD-10-CM

## 2018-11-08 ENCOUNTER — Encounter: Payer: Self-pay | Admitting: Medical

## 2018-11-29 ENCOUNTER — Other Ambulatory Visit: Payer: Self-pay

## 2018-11-29 MED ORDER — METOPROLOL TARTRATE 50 MG PO TABS: 50.0000 mg | ORAL_TABLET | Freq: Two times a day (BID) | ORAL | 1 refills | Status: DC

## 2018-12-07 ENCOUNTER — Other Ambulatory Visit: Payer: Self-pay

## 2018-12-07 ENCOUNTER — Encounter: Payer: Self-pay | Admitting: Medical

## 2018-12-07 ENCOUNTER — Ambulatory Visit (INDEPENDENT_AMBULATORY_CARE_PROVIDER_SITE_OTHER): Payer: Medicare Other | Admitting: Medical

## 2018-12-07 VITALS — BP 130/70 | HR 84 | Temp 96.9°F | Ht 67.0 in | Wt 148.8 lb

## 2018-12-07 DIAGNOSIS — E039 Hypothyroidism, unspecified: Secondary | ICD-10-CM

## 2018-12-07 DIAGNOSIS — Z91199 Patient's noncompliance with other medical treatment and regimen due to unspecified reason: Secondary | ICD-10-CM

## 2018-12-07 DIAGNOSIS — Z0000 Encounter for general adult medical examination without abnormal findings: Secondary | ICD-10-CM

## 2018-12-07 DIAGNOSIS — Z7189 Other specified counseling: Secondary | ICD-10-CM

## 2018-12-07 DIAGNOSIS — R6889 Other general symptoms and signs: Secondary | ICD-10-CM

## 2018-12-07 DIAGNOSIS — K9049 Malabsorption due to intolerance, not elsewhere classified: Secondary | ICD-10-CM | POA: Diagnosis not present

## 2018-12-07 DIAGNOSIS — I4891 Unspecified atrial fibrillation: Secondary | ICD-10-CM

## 2018-12-07 DIAGNOSIS — Z7185 Encounter for immunization safety counseling: Secondary | ICD-10-CM

## 2018-12-07 DIAGNOSIS — Z87891 Personal history of nicotine dependence: Secondary | ICD-10-CM | POA: Diagnosis not present

## 2018-12-07 DIAGNOSIS — F410 Panic disorder [episodic paroxysmal anxiety] without agoraphobia: Secondary | ICD-10-CM

## 2018-12-07 DIAGNOSIS — K529 Noninfective gastroenteritis and colitis, unspecified: Secondary | ICD-10-CM | POA: Diagnosis not present

## 2018-12-07 DIAGNOSIS — Z780 Asymptomatic menopausal state: Secondary | ICD-10-CM

## 2018-12-07 DIAGNOSIS — R61 Generalized hyperhidrosis: Secondary | ICD-10-CM

## 2018-12-07 DIAGNOSIS — E2839 Other primary ovarian failure: Secondary | ICD-10-CM

## 2018-12-07 DIAGNOSIS — Z9119 Patient's noncompliance with other medical treatment and regimen: Secondary | ICD-10-CM

## 2018-12-07 DIAGNOSIS — N289 Disorder of kidney and ureter, unspecified: Secondary | ICD-10-CM | POA: Diagnosis not present

## 2018-12-07 DIAGNOSIS — Z91148 Patient's other noncompliance with medication regimen for other reason: Secondary | ICD-10-CM

## 2018-12-07 DIAGNOSIS — K219 Gastro-esophageal reflux disease without esophagitis: Secondary | ICD-10-CM | POA: Diagnosis not present

## 2018-12-07 DIAGNOSIS — F339 Major depressive disorder, recurrent, unspecified: Secondary | ICD-10-CM | POA: Diagnosis not present

## 2018-12-07 DIAGNOSIS — Z23 Encounter for immunization: Secondary | ICD-10-CM

## 2018-12-07 DIAGNOSIS — M810 Age-related osteoporosis without current pathological fracture: Secondary | ICD-10-CM | POA: Diagnosis not present

## 2018-12-07 DIAGNOSIS — E041 Nontoxic single thyroid nodule: Secondary | ICD-10-CM | POA: Diagnosis not present

## 2018-12-07 DIAGNOSIS — I420 Dilated cardiomyopathy: Secondary | ICD-10-CM | POA: Diagnosis not present

## 2018-12-07 DIAGNOSIS — F411 Generalized anxiety disorder: Secondary | ICD-10-CM

## 2018-12-07 DIAGNOSIS — R5383 Other fatigue: Secondary | ICD-10-CM | POA: Diagnosis not present

## 2018-12-07 DIAGNOSIS — Z9114 Patient's other noncompliance with medication regimen: Secondary | ICD-10-CM

## 2018-12-07 DIAGNOSIS — Z1239 Encounter for other screening for malignant neoplasm of breast: Secondary | ICD-10-CM

## 2018-12-07 DIAGNOSIS — R7989 Other specified abnormal findings of blood chemistry: Secondary | ICD-10-CM | POA: Diagnosis not present

## 2018-12-07 NOTE — Progress Notes (Signed)
Subjective:    Rita Wright is a 68 y.o. female who presents for Preventative Services visit and chronic medical problems/med check visit.    Primary Care Provider Tysinger, Camelia Eng, PA-C here for primary care  Current Health Care Team:  Dentist, NONE-has dentures   Eye doctor, Dr. Jabier Mutton with [facility]   Dr. Vernell Leep and Binnie Kand NP with cardiology  Dr. Carol Ada, [place] you may have received from other than [facility] providers in the past year (date may be approximate) GI  Exercise Current exercise habits: walk the dog, no other exercises   Nutrition/Diet Current diet: well balanced  Depression Screen Depression screen [facility] 2/9 12/07/2018  Decreased Interest 0  Down, Depressed, Hopeless 1  PHQ - 2 Score 1  Altered sleeping -  Tired, decreased energy -  Change in appetite -  Feeling bad or failure about yourself  -  Trouble concentrating -  Moving slowly or fidgety/restless -  Suicidal thoughts -  PHQ-9 Score -  Difficult doing work/chores -    Activities of Daily Living Screen/Functional Status Survey Is the patient deaf or have difficulty hearing?: No Does the patient have difficulty seeing, even when wearing glasses/contacts?: No Does the patient have difficulty concentrating, remembering, or making decisions?: No Does the patient have difficulty walking or climbing stairs?: No Does the patient have difficulty dressing or bathing?: No Does the patient have difficulty doing errands alone such as visiting a doctor's office or shopping?: Yes(don't drive due to panic attacks)  Can patient draw a clock face showing 3:15 oclock, yes  Fall Risk Screen Fall Risk  12/07/2018 12/05/2017 09/19/2017 09/01/2016 10/20/2015  Falls in the past year? 0 No No No No  Comment - - - - Emmi Telephone Survey: data to providers prior to load    Gait Assessment: Normal gait observed yes   Advanced directives Does patient have a [place]? No Does patient have a Living Will? No  Past Medical History:  Diagnosis Date  . Colon cancer screening 09/2014   Cologuard test negative   . Depression   . Dilated cardiomyopathy ([place]) 05/2014   Dr. Woody Seller  . Echocardiogram abnormal 05/2014   mild dilation of Left Ventricle; Dr. Woody Seller  . Hashimoto's thyroiditis   . History of mammogram 10/10/14   normal  . Multinodular goiter   . Noncompliance with diagnostic testing    due to severe anxiety  . Osteoporosis   . Panic disorder    severe, doesn't drive, doesn't use stairs or elevators, severely limited by her anxiety  . Renal insufficiency   . Thyroid nodule [id number]   benign follicular nodule on biopsy    Past Surgical History:  Procedure Laterality Date  . COLONOSCOPY  2014   cologuard 2016; refuses colonoscopy  . TUBAL LIGATION  1986    Social History   Socioeconomic History  . Marital status: Married    Spouse name: Not on file  . Number of children: 2  . Years of education: Not on file  . Highest education level: Not on file  Occupational History  . Not on file  Social Needs  . Financial resource strain: Not on file  . Food insecurity    Worry: Not on file    Inability: Not on file  . Transportation needs    Medical: Not on file    Non-medical: Not on file  Tobacco Use  . Smoking status: Former Smoker  Packs/day: 1.50    Years: 35.00    Pack years: 52.50    Types: Cigarettes    Quit date: 02/23/1992    Years since quitting: 26.8  . Smokeless tobacco: Never Used  Substance and Sexual Activity  . Alcohol use: No  . Drug use: No  . Sexual activity: Not Currently  Lifestyle  . Physical activity    Days per week: Not on file    Minutes per session: Not on file  . Stress: Not on file  Relationships  . Social Herbalist on phone: Not on file    Gets together: Not on file    Attends religious service: Not on file    Active member of club or organization: Not on file    Attends  meetings of clubs or organizations: Not on file    Relationship status: Not on file  . Intimate partner violence    Fear of current or ex partner: Not on file    Emotionally abused: Not on file    Physically abused: Not on file    Forced sexual activity: Not on file  Other Topics Concern  . Not on file  Social History Narrative   Married.  Exercise with walking her dog, gardening.       Family History  Problem Relation Age of Onset  . Hypertension Mother   . Parkinson's disease Father   . Heart failure Sister   . Hypertension Brother   . Cerebral aneurysm Sister   . Throat cancer Brother      Current Outpatient Medications:  .  cholecalciferol (VITAMIN D) 400 UNITS TABS, Take 400 Units by mouth., Disp: , Rfl:  .  folic acid (FOLVITE) 1 MG tablet, Take 1 tablet (1 mg total) by mouth daily., Disp: 90 tablet, Rfl: 3 .  levothyroxine (SYNTHROID) 100 MCG tablet, TAKE 1 TABLET BY MOUTH EVERY DAY BEFORE BREAKFAST, Disp: 90 tablet, Rfl: 0 .  metoprolol tartrate (LOPRESSOR) 50 MG tablet, Take 1 tablet (50 mg total) by mouth 2 (two) times daily., Disp: 180 tablet, Rfl: 1 .  Rivaroxaban (XARELTO) 15 MG TABS tablet, Take 1 tablet (15 mg total) by mouth once for 1 dose., Disp: 90 tablet, Rfl: 3 .  valsartan (DIOVAN) 80 MG tablet, Take 1 tablet (80 mg total) by mouth daily. (Patient not taking: Reported on 12/07/2018), Disp: 30 tablet, Rfl: 1  Allergies  Allergen Reactions  . Boniva [Ibandronic Acid]     Horrible joint pains  . Erythromycin     GI upset  . Prednisone Other (See Comments)    Jittery, insomnia, intolerance  . Penicillins Hives, Rash and Other (See Comments)    Felt like she was going to pass out.    History reviewed: allergies, current medications, past family history, past medical history, past social history, past surgical history and problem list  Chronic issues discussed: Uses air fryer, no fried food.  Most foods are roasted or grilled.  Not eating fast.   Saw  heart doctor recently, was told her heart was getting weaker.  Also circulation in feet not so good.   Husband not doing so well either.  He had to have more carotid surgery recently, and he is having some tachycardia.    In last week stress and everything is getting to her.    Objective:      Biometrics BP 130/70   Pulse 84   Temp (!) 96.9 F (36.1 C)   Ht 5\' 7"  (  1.702 m)   Wt 148 lb 12.8 oz (67.5 kg)   LMP 02/23/1992   SpO2 98%   BMI 23.31 kg/m   General appearance: alert, no distress, WD/WN, white female HEENT: normocephalic, sclerae anicteric Neck: supple, no lymphadenopathy, no thyromegaly, no masses, no bruits Heart: irregularly irregular, normal S1, S2, no murmurs Lungs: CTA bilaterally, no wheezes, rhonchi, or rales Abdomen: +bs, soft, non tender, non distended, no masses, no hepatomegaly, no splenomegaly Musculoskeletal: nontender, no swelling, no obvious deformity Extremities: no edema, no cyanosis, no clubbing Pulses: 2+ symmetric, upper and+ lower extremities, somehwat decreased cap refill in feet Neurological: alert, oriented x 3, CN2-12 intact, strength normal upper extremities and lower extremities, sensation normal throughout, DTRs 2+ throughout, no cerebellar signs, gait normal Psychiatric: normal affect, behavior normal, pleasant  Breast/pelvic - deferred, declined   Assessment:   Encounter Diagnoses  Name Primary?  . Dilated cardiomyopathy ([place]) Yes  . Medicare annual wellness visit, subsequent   . Encounter for health maintenance examination in adult   . Atrial fibrillation, unspecified type ([place])   . Gastroesophageal reflux disease without esophagitis   . Chronic diarrhea   . Hypothyroidism, unspecified type   . Thyroid nodule   . Osteoporosis, unspecified osteoporosis type, unspecified pathological fracture presence   . Renal insufficiency   . Panic disorder   . Depression, recurrent ([place])   . Former smoker   . Milk intolerance   .  Post-menopausal   . Vaccine counseling   . Estrogen deficiency   . Need for influenza vaccination   . Generalized anxiety disorder   . Excessive sweating   . Fatigue, unspecified type   . Noncompliance with medication regimen   . Noncompliance with diagnostic testing   . Encounter for screening for malignant neoplasm of breast, unspecified screening modality   . Abnormal ankle brachial index (ABI)      Plan:   A preventative services visit was completed today.  During the course of the visit today, we discussed and counseled about appropriate screening and preventive services.  A health risk assessment was established today that included a review of current medications, allergies, social history, family history, medical and preventative health history, biometrics, and preventative screenings to identify potential safety concerns or impairments.  A personalized plan was printed today for your records and use.   Personalized health advice and education was given today to reduce health risks and promote self management and wellness.  Information regarding end of life planning was discussed today.  Conditions/risks identified: Anxiety that prohibits her compliance with care   Chronic problems discussed today:  We discussed her concerns.  Unfortunately her anxiety and personal preference is that she has declined a lot of recommendations.  She is taking her thyroid medicine.  The she declines many of the other screening recommendations and follow-up testing.  She even saw cardiology recently was advised to start valsartan which she has not started.  She had a positive Cologuard last year.  She would not go to GI for follow-up.  At this point she still declines referral despite the potential risk  I recommend she have a mammogram, bone density scan but she currently declines both  We have talked several times this past year about anxiety and possibly seeing a counselor or looking at other  medication options but she has declined.. I once again recommended counseling.  I also recommended they take her husband to neurology for consult on possible dementia  I gave her a prescription to go  have shingles vaccine at local pharmacy  Thyroid nodule-I recommended repeat thyroid ultrasound but she declines  She has recent abnormal blood flow study in the legs, ABI.  Advise she follow-up with cardiology  She also has not started the new medicine cardiologist recommended.  She has chronic A. fib and cardiomyopathy.  Advise she follow-up with cardiology   Acute problems discussed today: none   Recommendations:  I recommend a yearly ophthalmology/optometry visit for glaucoma screening and eye checkup  I recommended a yearly dental visit for hygiene and checkup  Advanced directives - discussed nature and purpose of Advanced Directives, encouraged them to complete them if they have not done so and/or encouraged them to get [place] a copy if they have done this already.   Immunizations: Counseled on the influenza virus vaccine.  Vaccine information sheet given.   High dose Influenza vaccine given after consent obtained.  Due for Td and shingrix.  She will consider.   Up to date on pneumococcal  S[place]ze was seen today for medicare wellness.  Diagnoses and all orders for this visit:  Dilated cardiomyopathy ([place]) -     Hepatic function panel -     CBC with Differential/Platelet -     Lipid panel  Medicare annual wellness visit, subsequent  Encounter for health maintenance examination in adult  Atrial fibrillation, unspecified type ([place]) -     Hepatic function panel  Gastroesophageal reflux disease without esophagitis  Chronic diarrhea -     Hepatic function panel  Hypothyroidism, unspecified type -     TSH -     T4, Free  Thyroid nodule -     TSH -     T4, Free  Osteoporosis, unspecified osteoporosis type, unspecified pathological fracture presence  Renal  insufficiency  Panic disorder  Depression, recurrent ([place])  Former smoker  Milk intolerance  Post-menopausal -     DG Bone Density; Future  Vaccine counseling  Estrogen deficiency -     DG Bone Density; Future  Need for influenza vaccination  Generalized anxiety disorder  Excessive sweating  Fatigue, unspecified type -     CBC with Differential/Platelet  Noncompliance with medication regimen  Noncompliance with diagnostic testing  Encounter for screening for malignant neoplasm of breast, unspecified screening modality -     MM DIGITAL SCREENING BILATERAL; Future  Abnormal ankle brachial index (ABI)    Medicare Attestation A preventative services visit was completed today.  During the course of the visit the patient was educated and counseled about appropriate screening and preventive services.  A health risk assessment was established with the patient that included a review of current medications, allergies, social history, family history, medical and preventative health history, biometrics, and preventative screenings to identify potential safety concerns or impairments.  A personalized plan was printed today for the patient's records and use.   Personalized health advice and education was given today to reduce health risks and promote self management and wellness.  Information regarding end of life planning was discussed today.  Dorothea Ogle, PA-C   12/07/2018

## 2018-12-07 NOTE — Patient Instructions (Addendum)
Recommendations:  See your eye doctor and dentist  yearly   Vaccines: We updated your flu shot today  consider Td tetanus diptheria vaccine.   You may get this cheaper at local pharmacy  Shingles vaccine:  I recommend you have a shingles vaccine to help prevent shingles or herpes zoster outbreak.   Please call your insurer to inquire about coverage for the Shingrix vaccine given in 2 doses.   Some insurers cover this vaccine after age 70, some cover this after age 62.  If your insurer covers this, then call to schedule appointment to have this vaccine here.    Cancer screening: We recommend a yearly mammogram  Please call to schedule your mammogram and bone denstiy tests.   [facility]  [phone] [place], [place] [address]  You had a posotive cologard test last year, so you are do for follow up colonoscpoy.  Let me know when you want to see the gastroenterologist so we can refer.  You are due for pap smear.    Me of Vickie here can do this.     Thyroid nodule  You are due for repeat ultrasound of thyroid gland. . Let me know when you want to do this.    I recommend you call your heart doctor back and asked them about your blood pressure studies and talk about that medication that you have not started

## 2018-12-08 LAB — LIPID PANEL
Chol/HDL Ratio: 2.7 ratio (ref 0.0–4.4)
Cholesterol, Total: 198 mg/dL (ref 100–199)
HDL: 73 mg/dL (ref >39)
LDL Chol Calc (NIH): 112 mg/dL — ABNORMAL HIGH (ref 0–99)
Triglycerides: 73 mg/dL (ref 0–149)
VLDL Cholesterol Cal: 13 mg/dL (ref 5–40)

## 2018-12-08 LAB — CBC WITH DIFFERENTIAL/PLATELET
Basophils Absolute: 0 10*3/uL (ref 0.0–0.2)
Basos: 1 %
EOS (ABSOLUTE): 0.1 10*3/uL (ref 0.0–0.4)
Eos: 2 %
Hematocrit: 50.9 % — ABNORMAL HIGH (ref 34.0–46.6)
Hemoglobin: 16.3 g/dL — ABNORMAL HIGH (ref 11.1–15.9)
Immature Grans (Abs): 0 10*3/uL (ref 0.0–0.1)
Immature Granulocytes: 0 %
Lymphocytes Absolute: 1 10*3/uL (ref 0.7–3.1)
Lymphs: 16 %
MCH: 30.4 pg (ref 26.6–33.0)
MCHC: 32 g/dL (ref 31.5–35.7)
MCV: 95 fL (ref 79–97)
Monocytes Absolute: 0.4 10*3/uL (ref 0.1–0.9)
Monocytes: 6 %
Neutrophils Absolute: 4.8 10*3/uL (ref 1.4–7.0)
Neutrophils: 75 %
Platelets: 173 10*3/uL (ref 150–450)
RBC: 5.37 x10E6/uL — ABNORMAL HIGH (ref 3.77–5.28)
RDW: 11.9 % (ref 11.7–15.4)
WBC: 6.4 10*3/uL (ref 3.4–10.8)

## 2018-12-08 LAB — TSH: TSH: 3.37 u[IU]/mL (ref 0.450–4.500)

## 2018-12-08 LAB — HEPATIC FUNCTION PANEL
ALT: 42 IU/L — ABNORMAL HIGH (ref 0–32)
AST: 30 IU/L (ref 0–40)
Albumin: 4.1 g/dL (ref 3.8–4.8)
Alkaline Phosphatase: 105 IU/L (ref 39–117)
Bilirubin Total: 0.7 mg/dL (ref 0.0–1.2)
Bilirubin, Direct: 0.17 mg/dL (ref 0.00–0.40)
Total Protein: 6.8 g/dL (ref 6.0–8.5)

## 2018-12-08 LAB — T4, FREE: Free T4: 1.43 ng/dL (ref 0.82–1.77)

## 2018-12-12 ENCOUNTER — Ambulatory Visit (INDEPENDENT_AMBULATORY_CARE_PROVIDER_SITE_OTHER): Payer: Medicare Other | Admitting: Cardiology

## 2018-12-12 ENCOUNTER — Other Ambulatory Visit: Payer: Self-pay

## 2018-12-12 ENCOUNTER — Encounter: Payer: Self-pay | Admitting: Cardiology

## 2018-12-12 VITALS — BP 127/82 | HR 78 | Temp 94.8°F | Ht 67.0 in | Wt 148.7 lb

## 2018-12-12 DIAGNOSIS — I4891 Unspecified atrial fibrillation: Secondary | ICD-10-CM | POA: Diagnosis not present

## 2018-12-12 DIAGNOSIS — I739 Peripheral vascular disease, unspecified: Secondary | ICD-10-CM | POA: Diagnosis not present

## 2018-12-12 DIAGNOSIS — I428 Other cardiomyopathies: Secondary | ICD-10-CM

## 2018-12-12 NOTE — Progress Notes (Signed)
Primary Physician:  Carlena Hurl, PA-C   Patient ID: Rita Wright, female    DOB: [birth year], 68 y.o.   MRN: [id number]  Subjective:    Chief Complaint  Patient presents with   Atrial Fibrillation    8 week follow up    HPI: Rita Wright  is a 68 y.o. female  with hashimoto's thyroiditis, osteoporosis, former tobacco use, non-ischemic cardiomyopathy and atrial fibrillation.   She was found to have Atrial fibrillation in July in 2019. She underwent lexiscan nuclear stress test on 09/05/2017 him and was considered high risk study due to reduced LVEF. She then underwent echocardiogram on 04/05/2018 revealing stable, but continued depressed LVEF 39%. As patient is very anxious regarding procedures and medications, per her wishes , she did not have cardioversion, ablation, or try antiarrhythmic therapy. She also did not want to try heart failure medications. She is on rate control therapy with Metoprolol 50mg  BID that she is tolerating well.   Due to slightly abnormal vascular exam and evidence of acrocyanosis, she underwent lower extremity arterial duplex and now presents to discuss results.  No symptoms of claudication or ulcerations.  She has some occasional tingling sensation in her legs, but mostly at rest and appears to improve with leg elevation.  She overall feels that she is doing well.  Her heart rate has been stable.  She was started on valsartan at her last office visit in view of cardiomyopathy, but patient has not yet started this.  She states she has had too much going on in her life as her husband is not doing well with his health and also she is having to care for her sister and has not yet wanted to start the medication.  Patient suffers from anxiety and has previous bad experiences with physicians and is reluctant to any medications or procedures.  Denies any dyspnea on exertion or chest pain. No ulcerations.  She does report being bitten by her sons puppy on her right  leg, that is healing well.  No PND or orthopnea. Rarely has ankle edema.   Reports that she was scheduled for colonoscopy; however, the day of procedure was cancelled in view of her CHF and needing to be performed in the hospital. She now does not want to have the colonoscopy. She denies any blood in her stool. Did have positive cologuard test.  States due to the confusion, she has lost her trust in having this procedure.    Past Medical History:  Diagnosis Date   Colon cancer screening 09/2014   Cologuard test negative    Depression    Dilated cardiomyopathy ([place]) 05/2014   Dr. Woody Seller   Echocardiogram abnormal 05/2014   mild dilation of Left Ventricle; Dr. Woody Seller   Hashimoto's thyroiditis    History of mammogram 10/10/14   normal   Multinodular goiter    Noncompliance with diagnostic testing    due to severe anxiety   Osteoporosis    Panic disorder    severe, doesn't drive, doesn't use stairs or elevators, severely limited by her anxiety   Renal insufficiency    Thyroid nodule [id number]   benign follicular nodule on biopsy    Past Surgical History:  Procedure Laterality Date   COLONOSCOPY  2014   cologuard 2016; refuses colonoscopy   TUBAL LIGATION  1986    Social History   Socioeconomic History   Marital status: Married    Spouse name: Not on file   Number of children:  2   Years of education: Not on file   Highest education level: Not on file  Occupational History   Not on file  Social Needs   Financial resource strain: Not on file   Food insecurity    Worry: Not on file    Inability: Not on file   Transportation needs    Medical: Not on file    Non-medical: Not on file  Tobacco Use   Smoking status: Former Smoker    Packs/day: 1.50    Years: 35.00    Pack years: 52.50    Types: Cigarettes    Quit date: 02/23/1992    Years since quitting: 26.8   Smokeless tobacco: Never Used  Substance and Sexual Activity   Alcohol use: No   Drug use:  No   Sexual activity: Not Currently  Lifestyle   Physical activity    Days per week: Not on file    Minutes per session: Not on file   Stress: Not on file  Relationships   Social connections    Talks on phone: Not on file    Gets together: Not on file    Attends religious service: Not on file    Active member of club or organization: Not on file    Attends meetings of clubs or organizations: Not on file    Relationship status: Not on file   Intimate partner violence    Fear of current or ex partner: Not on file    Emotionally abused: Not on file    Physically abused: Not on file    Forced sexual activity: Not on file  Other Topics Concern   Not on file  Social History Narrative   Married.  Exercise with walking her dog, gardening.       Review of Systems  Constitution: Negative for decreased appetite, malaise/fatigue, weight gain and weight loss.  Eyes: Negative for visual disturbance.  Cardiovascular: Negative for chest pain, claudication, dyspnea on exertion (over exertion), leg swelling, orthopnea, palpitations, paroxysmal nocturnal dyspnea (occasional) and syncope.  Respiratory: Negative for hemoptysis and wheezing.   Endocrine: Negative for cold intolerance and heat intolerance.  Hematologic/Lymphatic: Does not bruise/bleed easily.  Skin: Negative for nail changes.  Musculoskeletal: Negative for muscle weakness and myalgias.  Gastrointestinal: Negative for abdominal pain, change in bowel habit, nausea and vomiting.  Neurological: Negative for difficulty with concentration, dizziness, focal weakness and headaches.  Psychiatric/Behavioral: Positive for depression. Negative for altered mental status and suicidal ideas. The patient is nervous/anxious.   All other systems reviewed and are negative.     Objective:  Blood pressure 127/82, pulse 78, temperature (!) 94.8 F (34.9 C), height 5\' 7"  (1.702 m), weight 148 lb 11.2 oz (67.4 kg), last menstrual period  02/23/1992, SpO2 97 %. Body mass index is 23.29 kg/m.    Physical Exam  Constitutional: She is oriented to person, place, and time. Vital signs are normal. She appears well-developed and well-nourished.  HENT:  Head: Normocephalic and atraumatic.  Neck: Normal range of motion.  Cardiovascular: Normal rate, normal heart sounds and intact distal pulses. An irregularly irregular rhythm present.  Pulses:      Femoral pulses are 1+ on the right side and 1+ on the left side.      Popliteal pulses are 1+ on the right side and 1+ on the left side.       Dorsalis pedis pulses are 1+ on the right side and 1+ on the left side.  Posterior tibial pulses are 0 on the right side and 0 on the left side.  Acrocyanosis bilaterally  Pulmonary/Chest: Effort normal and breath sounds normal. No accessory muscle usage. No respiratory distress.  Abdominal: Soft. Bowel sounds are normal.  Musculoskeletal: Normal range of motion.  Neurological: She is alert and oriented to person, place, and time.  Skin: Skin is warm and dry.  Vitals reviewed.  Radiology: No results found.  Laboratory examination:    CMP Latest Ref Rng & Units 12/07/2018 10/26/2018 12/05/2017  Glucose 65 - 99 mg/dL - 109(H) 92  BUN 8 - 27 mg/dL - 44(H) 41(H)  Creatinine 0.57 - 1.00 mg/dL - 1.04(H) 1.36(H)  Sodium 134 - 144 mmol/L - 140 140  Potassium 3.5 - 5.2 mmol/L - 4.5 4.8  Chloride 96 - 106 mmol/L - 103 101  CO2 20 - 29 mmol/L - 25 25  Calcium 8.7 - 10.3 mg/dL - 9.1 9.5  Total Protein 6.0 - 8.5 g/dL 6.8 - -  Total Bilirubin 0.0 - 1.2 mg/dL 0.7 - -  Alkaline Phos 39 - 117 IU/L 105 - -  AST 0 - 40 IU/L 30 - -  ALT 0 - 32 IU/L 42(H) - -   CBC Latest Ref Rng & Units 12/07/2018 09/14/2017 08/22/2017  WBC 3.4 - 10.8 x10E3/uL 6.4 6.3 6.4  Hemoglobin 11.1 - 15.9 g/dL 16.3(H) 15.5 17.0(H)  Hematocrit 34.0 - 46.6 % 50.9(H) 48.2(H) 50.9(H)  Platelets 150 - 450 x10E3/uL 173 178 191   Lipid Panel     Component Value Date/Time    CHOL 198 12/07/2018 1025   TRIG 73 12/07/2018 1025   HDL 73 12/07/2018 1025   CHOLHDL 2.7 12/07/2018 1025   CHOLHDL 2.5 09/01/2016 0818   VLDL 12 09/01/2016 0818   LDLCALC 112 (H) 12/07/2018 1025   HEMOGLOBIN A1C No results found for: HGBA1C, MPG TSH Recent Labs    12/07/18 1025  TSH 3.370    PRN Meds:. There are no discontinued medications. Current Meds  Medication Sig   cholecalciferol (VITAMIN D) 400 UNITS TABS Take 400 Units by mouth.   folic acid (FOLVITE) 1 MG tablet Take 1 tablet (1 mg total) by mouth daily.   levothyroxine (SYNTHROID) 100 MCG tablet TAKE 1 TABLET BY MOUTH EVERY DAY BEFORE BREAKFAST   metoprolol tartrate (LOPRESSOR) 50 MG tablet Take 1 tablet (50 mg total) by mouth 2 (two) times daily.   Rivaroxaban (XARELTO) 15 MG TABS tablet Take 1 tablet (15 mg total) by mouth once for 1 dose.    Cardiac Studies:   Lower Extremity Arterial Duplex 11/03/2018:  No hemodynamically significant stenoses are identified in the  lower extremity arterial system.  This exam reveals moderately decreased perfusion of the right lower extremity, noted at the post tibial artery level (ABI 0.75). This exam reveals moderately decreased perfusion of the left lower extremity, noted at the dorsalis pedis artery level (ABI 0.75).  Study suggests diffuse disease.  Echocardiogram 04/05/2018: Left ventricle cavity is mildly dilated. Moderate decrease in global wall motion. Visual EF is 30-35%. Unable to evaluate diastolic function due to A. Fibrillation. Calculated EF 39%. Left atrial cavity is moderately dilated. Right atrial cavity is mildly dilated. Moderate (Grade II) mitral regurgitation. Moderate, eccentric tricuspid regurgitation. Estimated pulmonary artery systolic pressure 19 mmHg. Insignificant pericardial effusion. No significant change compared to previous study on 10/05/2017.  Lexiscan myoview stress test 09/05/2017: 1. Lexiscan stress test was performed. Exercise  capacity was not assessed. Stress symptoms included headache, "panic like symptoms".  Peak blood pressure was 162/102 mmHg. The resting and stress electrocardiogram demonstrated atrial fibrillation with rapid ventricular response, RBBB + LAHB, no resting arrhythmias and normal rest repolarization. Stress EKG is non diagnostic for ischemia as it is a pharmacologic stress. 2. The overall quality of the study is good. Left ventricular cavity is noted to be normal on the rest and stress studies. Gated SPECT images reveal global reduction in normal myocardial thickening and wall motion. The left ventricular ejection fraction was calculated or visually estimated to be 26%. LVEF could be underestimated due to gating difficulties during Afib w/RVR. Review of the raw data in a rotational cine format reveals breast attenuation with imaging performed in sitting position. REST and STRESS images demonstrate small sized area of mildly decreased tracer uptake in the basal inferoseptal, basal inferior, mid inferoseptal and mid inferior segments of the left ventricle with no reversibility. Findings most likely represent breast attenuation artifact. 3. High risk study due to reduced LVEF. Recommend echocardiogram, preferably with controlled ventricular rate, for accurate LVEF estimation.  Assessment:     ICD-10-CM   1. Atrial fibrillation with controlled ventricular rate (HCC)  I48.91 EKG 12-Lead  2. Nonischemic cardiomyopathy (HCC)  I42.8   3. PAD (peripheral artery disease) (HCC)  I73.9     EKG 12/12/2018: Atrial fibrillation with controlled ventricular response at 85 bpm, left axis deviation, PRWP cannot exclude anteroseptal infarct old. No evidence of ischemia.   Recommendations:   I discussed recently obtain lower extremity arterial duplex results, has moderately decreased perfusion bilaterally.  No evidence of critical limb ischemia and is without symptoms of claudication.  Previously reported numbness tingling  has essentially resolved.  I would recommend medical management in view of lack of limb ischemia and symptoms.  She is currently not on statin therapy.  She is very fearful of medications.  I recommend that she at least be on a statin, she wishes to think about this and let me know if she is willing to take this.  Encouraged her to walk daily to help with her circulation and also to advise me if she develops any ulcerations or skin color changes.  She does currently have a dog bite to her right shin, but this is healing well.  In regard to cardiomyopathy, she is essentially asymptomatic.  She is not on Entresto in view of this.  I have recommended that she be on valsartan at least at her last office visit to hopefully help with her low EF, but patient has not yet started this.  She again is very fearful of medications and states that she has not decided if she is willing to take this yet.  I have again advised her of my reasoning for starting medication, and she states she will consider this.  She remains rate controlled with metoprolol in regards to A. fib.  We will continue the same.    She is on Xarelto for anticoagulation and tolerating this well.  She previously had had a positive Cologuard test was scheduled for colonoscopy, but due to some confusion between our office and GI the procedure was canceled. CBC is stable and no obvious signs of bleeding.  I have recommended that she reschedule this, but patient states that because of the confusion, she no longer wants to have the procedure done at this time.  I have asked her to reconsider, which she states she will think about this.  As she is overall doing well, I will see her back in 6  months or sooner if problems.  Miquel Dunn, MSN, APRN, FNP-C [facility]. [place] Office: [phone] Fax: [phone]

## 2018-12-13 ENCOUNTER — Other Ambulatory Visit: Payer: Self-pay | Admitting: Medical

## 2018-12-13 DIAGNOSIS — R7989 Other specified abnormal findings of blood chemistry: Secondary | ICD-10-CM

## 2018-12-13 NOTE — Progress Notes (Signed)
s li

## 2018-12-20 LAB — HEPATITIS PANEL, ACUTE
Hep A IgM: NEGATIVE
Hep B C IgM: NEGATIVE
Hep C Virus Ab: 0.1 s/co ratio (ref 0.0–0.9)
Hepatitis B Surface Ag: NEGATIVE

## 2018-12-20 LAB — SPECIMEN STATUS REPORT

## 2019-01-01 ENCOUNTER — Other Ambulatory Visit: Payer: Self-pay | Admitting: Medical

## 2019-01-01 DIAGNOSIS — E2839 Other primary ovarian failure: Secondary | ICD-10-CM

## 2019-01-01 DIAGNOSIS — E039 Hypothyroidism, unspecified: Secondary | ICD-10-CM

## 2019-01-11 ENCOUNTER — Telehealth: Payer: Self-pay

## 2019-01-11 ENCOUNTER — Other Ambulatory Visit: Payer: Self-pay

## 2019-01-11 DIAGNOSIS — I4891 Unspecified atrial fibrillation: Secondary | ICD-10-CM

## 2019-01-11 DIAGNOSIS — E039 Hypothyroidism, unspecified: Secondary | ICD-10-CM

## 2019-01-11 MED ORDER — LEVOTHYROXINE SODIUM 100 MCG PO TABS: ORAL_TABLET | ORAL | 0 refills | Status: DC

## 2019-01-11 MED ORDER — METOPROLOL TARTRATE 50 MG PO TABS: 50.0000 mg | ORAL_TABLET | Freq: Two times a day (BID) | ORAL | 1 refills | Status: DC

## 2019-01-11 NOTE — Telephone Encounter (Signed)
meds has been sent and patient was given phone number to schedule with imaging

## 2019-01-11 NOTE — Telephone Encounter (Signed)
Pt. Called to check on the status of an [place] that was ordered about 3 weeks ago of her Liver no one has called her to schedule it yet, pt. Also stated she needs a refill on her Levothyroxine to meds by mail, pt. Last apt was, 12/07/18.

## 2019-01-25 DIAGNOSIS — H2513 Age-related nuclear cataract, bilateral: Secondary | ICD-10-CM | POA: Diagnosis not present

## 2019-01-25 DIAGNOSIS — D3131 Benign neoplasm of right choroid: Secondary | ICD-10-CM | POA: Diagnosis not present

## 2019-04-25 ENCOUNTER — Telehealth: Payer: Self-pay | Admitting: Medical

## 2019-04-25 NOTE — Telephone Encounter (Signed)
Pt called for refills of Synthroid. Please send to mail order pharmacy meds by mail.

## 2019-04-26 ENCOUNTER — Other Ambulatory Visit: Payer: Self-pay | Admitting: Medical

## 2019-04-26 ENCOUNTER — Other Ambulatory Visit: Payer: Self-pay

## 2019-04-26 DIAGNOSIS — I4891 Unspecified atrial fibrillation: Secondary | ICD-10-CM

## 2019-04-26 DIAGNOSIS — E039 Hypothyroidism, unspecified: Secondary | ICD-10-CM

## 2019-04-26 MED ORDER — RIVAROXABAN 15 MG PO TABS: 15.0000 mg | ORAL_TABLET | Freq: Once | ORAL | 3 refills | Status: DC

## 2019-04-26 MED ORDER — LEVOTHYROXINE SODIUM 100 MCG PO TABS: ORAL_TABLET | ORAL | 2 refills | Status: DC

## 2019-04-26 NOTE — Telephone Encounter (Signed)
done

## 2019-05-18 ENCOUNTER — Other Ambulatory Visit: Payer: Self-pay | Admitting: Cardiology

## 2019-05-18 DIAGNOSIS — I4891 Unspecified atrial fibrillation: Secondary | ICD-10-CM

## 2019-06-12 NOTE — Progress Notes (Signed)
Primary Physician:  Carlena Hurl, PA-C   Patient ID: Rita Wright, female    DOB: Dec 05, 1950, 69 y.o.   MRN: [id number]  Subjective:    Chief Complaint  Patient presents with  . Congestive Heart Failure  . Atrial Fibrillation  . PAD  . Follow-up    6 month    HPI: Rita Wright  is a 69 y.o. female  with hashimoto's thyroiditis, osteoporosis, former tobacco use, non-ischemic cardiomyopathy and permanent atrial fibrillation.   She underwent lexiscan nuclear stress test on 09/05/2017 him and was considered high risk study due to reduced LVEF but non-ischemic and echocardiogram on 04/05/2018 revealing stable, but continued depressed LVEF 39%. As patient is very anxious regarding procedures and medications, per her wishes , she did not have cardioversion, ablation, or try antiarrhythmic therapy. She also did not want to try heart failure medications. She is on rate control therapy with Metoprolol '50mg'$  BID that she is tolerating well. She is also on Xarelto for atrial fibrillation.   Due to slightly abnormal vascular exam and evidence of acrocyanosis, lower extremity arterial duplex revealed diffuse disease and mildly reduced bilateral ABI.  She is asymptomatic with regard to leg cramps. She presents for 6 months f/u and has no specific complaints today.     Past Medical History:  Diagnosis Date  . Colon cancer screening 09/2014   Cologuard test negative   . Depression   . Dilated cardiomyopathy ([place]) 05/2014   Dr. Woody Seller  . Echocardiogram abnormal 05/2014   mild dilation of Left Ventricle; Dr. Woody Seller  . Hashimoto's thyroiditis   . History of mammogram 10/10/14   normal  . Multinodular goiter   . Noncompliance with diagnostic testing    due to severe anxiety  . Osteoporosis   . Panic disorder    severe, doesn't drive, doesn't use stairs or elevators, severely limited by her anxiety  . Renal insufficiency   . Thyroid nodule 0/92/95   benign follicular nodule on biopsy     Past Surgical History:  Procedure Laterality Date  . COLONOSCOPY  2014   cologuard 2016; refuses colonoscopy  . TUBAL LIGATION  1986   Social History   Tobacco Use  . Smoking status: Former Smoker    Packs/day: 1.50    Years: 35.00    Pack years: 52.50    Types: Cigarettes    Quit date: 02/23/1992    Years since quitting: 27.3  . Smokeless tobacco: Never Used  Substance Use Topics  . Alcohol use: No   Marital Status: Married   Review of Systems  Cardiovascular: Negative for chest pain, dyspnea on exertion and leg swelling.  Gastrointestinal: Negative for melena.  Psychiatric/Behavioral: Positive for depression. The patient is nervous/anxious.    Objective:  Blood pressure 125/80, pulse 74, temperature (!) 97.2 F (36.2 C), temperature source Temporal, resp. rate 16, height '5\' 7"'$  (1.702 m), weight 144 lb (65.3 kg), last menstrual period 02/23/1992, SpO2 96 %. Body mass index is 22.55 kg/m.   Vitals with BMI 06/13/2019 12/12/2018 12/07/2018  Height '5\' 7"'$  '5\' 7"'$  '5\' 7"'$   Weight 144 lbs 148 lbs 11 oz 148 lbs 13 oz  BMI 22.55 74.73 40.3  Systolic 709 643 838  Diastolic 80 82 70  Pulse 74 78 84       Physical Exam  Constitutional: Vital signs are normal. She appears well-developed and well-nourished.  Cardiovascular: Normal rate and normal heart sounds. An irregularly irregular rhythm present.  Pulses:  Carotid pulses are 2+ on the right side and 2+ on the left side.      Femoral pulses are 2+ on the right side and 2+ on the left side.      Popliteal pulses are 2+ on the right side and 2+ on the left side.       Dorsalis pedis pulses are 1+ on the right side and 1+ on the left side.       Posterior tibial pulses are 1+ on the right side and 1+ on the left side.  Acrocyanosis bilaterally. No pedal edema. No JVD.  Pulmonary/Chest: Effort normal and breath sounds normal. No accessory muscle usage. No respiratory distress.  Abdominal: Soft. Bowel sounds are normal.   Musculoskeletal:        General: Normal range of motion.  Vitals reviewed.  Radiology: No results found.  Laboratory examination:    CMP Latest Ref Rng & Units 12/07/2018 10/26/2018 12/05/2017  Glucose 65 - 99 mg/dL - 109(H) 92  BUN 8 - 27 mg/dL - 44(H) 41(H)  Creatinine 0.57 - 1.00 mg/dL - 1.04(H) 1.36(H)  Sodium 134 - 144 mmol/L - 140 140  Potassium 3.5 - 5.2 mmol/L - 4.5 4.8  Chloride 96 - 106 mmol/L - 103 101  CO2 20 - 29 mmol/L - 25 25  Calcium 8.7 - 10.3 mg/dL - 9.1 9.5  Total Protein 6.0 - 8.5 g/dL 6.8 - -  Total Bilirubin 0.0 - 1.2 mg/dL 0.7 - -  Alkaline Phos 39 - 117 IU/L 105 - -  AST 0 - 40 IU/L 30 - -  ALT 0 - 32 IU/L 42(H) - -   CBC Latest Ref Rng & Units 12/07/2018 09/14/2017 08/22/2017  WBC 3.4 - 10.8 x10E3/uL 6.4 6.3 6.4  Hemoglobin 11.1 - 15.9 g/dL 16.3(H) 15.5 17.0(H)  Hematocrit 34.0 - 46.6 % 50.9(H) 48.2(H) 50.9(H)  Platelets 150 - 450 x10E3/uL 173 178 191   Lipid Panel     Component Value Date/Time   CHOL 198 12/07/2018 1025   TRIG 73 12/07/2018 1025   HDL 73 12/07/2018 1025   CHOLHDL 2.7 12/07/2018 1025   CHOLHDL 2.5 09/01/2016 0818   VLDL 12 09/01/2016 0818   LDLCALC 112 (H) 12/07/2018 1025   HEMOGLOBIN A1C No results found for: HGBA1C, MPG TSH Recent Labs    12/07/18 1025  TSH 3.370   PRN Meds:. There are no discontinued medications. Current Meds  Medication Sig  . cholecalciferol (VITAMIN D) 400 UNITS TABS Take 400 Units by mouth.  . folic acid (FOLVITE) 1 MG tablet Take 1 tablet (1 mg total) by mouth daily.  Marland Kitchen levothyroxine (SYNTHROID) 100 MCG tablet TAKE 1 TABLET BY MOUTH EVERY DAY BEFORE BREAKFAST  . metoprolol tartrate (LOPRESSOR) 50 MG tablet TAKE 1 TABLET BY MOUTH TWICE A DAY   Cardiac Studies:   Lexiscan myoview stress test 09/05/2017: 1. Lexiscan stress test was performed. Exercise capacity was not assessed. Stress symptoms included headache, "panic like symptoms". Peak blood pressure was 162/102 mmHg. The resting and stress  electrocardiogram demonstrated atrial fibrillation with rapid ventricular response, RBBB + LAHB, no resting arrhythmias and normal rest repolarization. Stress EKG is non diagnostic for ischemia as it is a pharmacologic stress. 2. The overall quality of the study is good. Left ventricular cavity is noted to be normal on the rest and stress studies. Gated SPECT images reveal global reduction in normal myocardial thickening and wall motion. The left ventricular ejection fraction was calculated or visually estimated to be 26%.  LVEF could be underestimated due to gating difficulties during Afib w/RVR. Review of the raw data in a rotational cine format reveals breast attenuation with imaging performed in sitting position. REST and STRESS images demonstrate small sized area of mildly decreased tracer uptake in the basal inferoseptal, basal inferior, mid inferoseptal and mid inferior segments of the left ventricle with no reversibility. Findings most likely represent breast attenuation artifact. 3. High risk study due to reduced LVEF. Recommend echocardiogram, preferably with controlled ventricular rate, for accurate LVEF estimation.  Echocardiogram 04/05/2018: Left ventricle cavity is mildly dilated. Moderate decrease in global wall motion. Visual EF is 30-35%. Unable to evaluate diastolic function due to A. Fibrillation. Calculated EF 39%. Left atrial cavity is moderately dilated. Right atrial cavity is mildly dilated. Moderate (Grade II) mitral regurgitation. Moderate, eccentric tricuspid regurgitation. Estimated pulmonary artery systolic pressure 19 mmHg. Insignificant pericardial effusion. No significant change compared to previous study on 10/05/2017.  Lower Extremity Arterial Duplex 11/03/2018:  No hemodynamically significant stenoses are identified in the  lower extremity arterial system.  This exam reveals moderately decreased perfusion of the right lower extremity, noted at the post tibial artery  level (ABI 0.75). This exam reveals moderately decreased perfusion of the left lower extremity, noted at the dorsalis pedis artery level (ABI 0.75).  Study suggests diffuse disease.  EKG:  EKG 06/13/2019: Atrial fibrillation with controlled ventricular response at the rate of 92 bpm, left axis deviation, left anterior fascicular block.  Incomplete right bundle branch block.  Poor R wave progression, cannot exclude anteroseptal infarct old.  Nonspecific T abnormality.   No significant change from EKG 12/12/2018  Assessment:     ICD-10-CM   1. Permanent atrial fibrillation ([place]). CHA2DS2-VASc Score is 5.  Yearly risk of stroke: 6.7% (A, F, HTN, PAD, CHF).      I48.21 EKG 12-Lead    CBC    PCV ECHOCARDIOGRAM COMPLETE  2. Chronic systolic heart failure (HCC)  I50.22 PCV ECHOCARDIOGRAM COMPLETE  3. PAD (peripheral artery disease) (HCC)  I73.9   4. Stage 3a chronic kidney disease  N18.31 CMP14+EGFR  5. Hypercholesteremia  E78.00 Lipid Panel With LDL/HDL Ratio   Recommendations:   Rita Wright  is a 69 y.o. Caucasian female patient with hashimoto's thyroiditis, osteoporosis, former tobacco use, non-ischemic cardiomyopathy and permanent atrial fibrillation. Per her wishes , she did not have cardioversion, ablation, or try antiarrhythmic therapy. She also did not want to try heart failure medications. She is on rate control therapy with Metoprolol '50mg'$  BID that she is tolerating well.   She is on Xarelto for anticoagulation and tolerating this well.  She previously had had a positive Cologuard test was scheduled for colonoscopy, but due to some confusion between our office and GI the procedure was canceled. She does not wish to proceed further with colonoscopy as she feels well.  As it is been >6 months since recent labs, she needs renal function testing and CMP with CBC.  She has no symptoms of claudication. She is currently not on statin therapy. She is very fearful of medications. But she wants me  to send Rx, will start Crestor 5 mg daily  In regard to cardiomyopathy, she is essentially asymptomatic. I had recommended that she be on valsartan at least at her last office visit to hopefully help with her low EF, she has not started this. I will recheck her echo to see if EF is stable to make therapeutic decisions and impress patient. OV in 3 months for f/u  of lipids and echo to improve compliance.    Adrian Prows, MD, [facility] 06/13/2019, 9:56 AM [facility] [place] Office: [phone]

## 2019-06-13 ENCOUNTER — Encounter: Payer: Self-pay | Admitting: Cardiology

## 2019-06-13 ENCOUNTER — Other Ambulatory Visit: Payer: Self-pay

## 2019-06-13 ENCOUNTER — Ambulatory Visit: Payer: Medicare Other | Admitting: Cardiology

## 2019-06-13 VITALS — BP 125/80 | HR 74 | Temp 97.2°F | Resp 16 | Ht 67.0 in | Wt 144.0 lb

## 2019-06-13 DIAGNOSIS — N1831 Chronic kidney disease, stage 3a: Secondary | ICD-10-CM

## 2019-06-13 DIAGNOSIS — I739 Peripheral vascular disease, unspecified: Secondary | ICD-10-CM

## 2019-06-13 DIAGNOSIS — E7800 Pure hypercholesterolemia, unspecified: Secondary | ICD-10-CM

## 2019-06-13 DIAGNOSIS — I5022 Chronic systolic (congestive) heart failure: Secondary | ICD-10-CM

## 2019-06-13 DIAGNOSIS — I4821 Permanent atrial fibrillation: Secondary | ICD-10-CM

## 2019-06-13 MED ORDER — ROSUVASTATIN CALCIUM 5 MG PO TABS: 5.0000 mg | ORAL_TABLET | Freq: Every day | ORAL | 1 refills | Status: DC

## 2019-06-27 ENCOUNTER — Ambulatory Visit: Payer: Medicare Other

## 2019-06-27 ENCOUNTER — Other Ambulatory Visit: Payer: Self-pay

## 2019-06-27 DIAGNOSIS — I5022 Chronic systolic (congestive) heart failure: Secondary | ICD-10-CM | POA: Diagnosis not present

## 2019-06-27 DIAGNOSIS — I4821 Permanent atrial fibrillation: Secondary | ICD-10-CM | POA: Diagnosis not present

## 2019-07-01 NOTE — Progress Notes (Signed)
Although clinically she is stable, LV function continues to be depressed. Patient has upcoming appointment in 2 months and will try to address and add guideline directed medical therapy. Patient hesitant to be on multiple medications. This message shared with patient.

## 2019-07-11 ENCOUNTER — Encounter: Payer: Self-pay | Admitting: Medical

## 2019-09-18 ENCOUNTER — Telehealth: Payer: Self-pay | Admitting: Medical

## 2019-09-18 DIAGNOSIS — E7800 Pure hypercholesterolemia, unspecified: Secondary | ICD-10-CM | POA: Diagnosis not present

## 2019-09-18 DIAGNOSIS — I4821 Permanent atrial fibrillation: Secondary | ICD-10-CM | POA: Diagnosis not present

## 2019-09-18 DIAGNOSIS — N1831 Chronic kidney disease, stage 3a: Secondary | ICD-10-CM | POA: Diagnosis not present

## 2019-09-18 NOTE — Telephone Encounter (Signed)
From pt

## 2019-09-18 NOTE — Telephone Encounter (Signed)
Spoke to patient and she stated  is getting better.

## 2019-09-18 NOTE — Telephone Encounter (Signed)
Pt called and said she got blood work done at the [facility] on [address] [place] and her arm has been swelling every time she removed the bandage. My advised by the lab tech here to alternate between cold and hot compresses. Pt was advised to call the [facility] and the drs office that ordered the labs. Pt stated that she called both places but could not get through.

## 2019-09-18 NOTE — Telephone Encounter (Signed)
She can certainly schedule appointment for evaluation if she can't get in with them.  She likely has a bruise or hematoma.  But again I cannot tell without seeing it.  I would just recommend cool pack and arm elevation up on a pillow several times over the next 24 hours.  Cool pack for 20 minutes at a time.  If significantly worse or if I am in usual severe swelling or bruising get checked out at urgent care

## 2019-09-19 LAB — CMP14+EGFR
ALT: 50 IU/L — ABNORMAL HIGH (ref 0–32)
AST: 35 IU/L (ref 0–40)
Albumin/Globulin Ratio: 1.6 (ref 1.2–2.2)
Albumin: 4.2 g/dL (ref 3.8–4.8)
Alkaline Phosphatase: 109 IU/L (ref 48–121)
BUN/Creatinine Ratio: 41 — ABNORMAL HIGH (ref 12–28)
BUN: 41 mg/dL — ABNORMAL HIGH (ref 8–27)
Bilirubin Total: 0.7 mg/dL (ref 0.0–1.2)
CO2: 24 mmol/L (ref 20–29)
Calcium: 9.7 mg/dL (ref 8.7–10.3)
Chloride: 100 mmol/L (ref 96–106)
Creatinine, Ser: 0.99 mg/dL (ref 0.57–1.00)
GFR calc Af Amer: 67 mL/min/{1.73_m2} (ref >59)
GFR calc non Af Amer: 58 mL/min/{1.73_m2} — ABNORMAL LOW (ref >59)
Globulin, Total: 2.7 g/dL (ref 1.5–4.5)
Glucose: 100 mg/dL — ABNORMAL HIGH (ref 65–99)
Potassium: 4.5 mmol/L (ref 3.5–5.2)
Sodium: 140 mmol/L (ref 134–144)
Total Protein: 6.9 g/dL (ref 6.0–8.5)

## 2019-09-19 LAB — CBC
Hematocrit: 49.7 % — ABNORMAL HIGH (ref 34.0–46.6)
Hemoglobin: 16.4 g/dL — ABNORMAL HIGH (ref 11.1–15.9)
MCH: 31.3 pg (ref 26.6–33.0)
MCHC: 33 g/dL (ref 31.5–35.7)
MCV: 95 fL (ref 79–97)
Platelets: 170 10*3/uL (ref 150–450)
RBC: 5.24 x10E6/uL (ref 3.77–5.28)
RDW: 11.8 % (ref 11.7–15.4)
WBC: 5.6 10*3/uL (ref 3.4–10.8)

## 2019-09-19 LAB — LIPID PANEL WITH LDL/HDL RATIO
Cholesterol, Total: 228 mg/dL — ABNORMAL HIGH (ref 100–199)
HDL: 81 mg/dL (ref >39)
LDL Chol Calc (NIH): 135 mg/dL — ABNORMAL HIGH (ref 0–99)
LDL/HDL Ratio: 1.7 ratio (ref 0.0–3.2)
Triglycerides: 71 mg/dL (ref 0–149)
VLDL Cholesterol Cal: 12 mg/dL (ref 5–40)

## 2019-09-20 ENCOUNTER — Encounter: Payer: Self-pay | Admitting: Cardiology

## 2019-09-20 ENCOUNTER — Other Ambulatory Visit: Payer: Self-pay

## 2019-09-20 ENCOUNTER — Ambulatory Visit: Payer: Medicare Other | Admitting: Cardiology

## 2019-09-20 VITALS — BP 123/80 | HR 80 | Ht 67.0 in | Wt 140.0 lb

## 2019-09-20 DIAGNOSIS — I428 Other cardiomyopathies: Secondary | ICD-10-CM

## 2019-09-20 DIAGNOSIS — I5022 Chronic systolic (congestive) heart failure: Secondary | ICD-10-CM | POA: Diagnosis not present

## 2019-09-20 DIAGNOSIS — N1831 Chronic kidney disease, stage 3a: Secondary | ICD-10-CM

## 2019-09-20 DIAGNOSIS — I4821 Permanent atrial fibrillation: Secondary | ICD-10-CM | POA: Diagnosis not present

## 2019-09-20 MED ORDER — VALSARTAN 80 MG PO TABS: 80.0000 mg | ORAL_TABLET | Freq: Every day | ORAL | 1 refills | Status: DC

## 2019-09-20 NOTE — Progress Notes (Signed)
Primary Physician:  Carlena Hurl, PA-C   Patient ID: Rita Wright, female    DOB: [birth year], 69 y.o.   MRN: [id number]  Subjective:    Chief Complaint  Patient presents with  . Cardiomyopathy  . Congestive Heart Failure  . Atrial Fibrillation  . Follow-up    labs    HPI: Rita Wright  is a 69 y.o. 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   By nuclear stress test in 2019, felt to be nonischemic.  Patient did not want cardioversion or atrial fibrillation ablation evaluation.  She is also very averse to starting any medications.  This is a 2-month office visit.  Denies any chest pain, dyspnea, continues to exercise regularly.  Tolerating anticoagulation without bleeding diathesis.  Past Medical History:  Diagnosis Date  . Colon cancer screening 09/2014   Cologuard test negative   . Depression   . Dilated cardiomyopathy ([place]) 05/2014   Dr. Woody Seller  . Echocardiogram abnormal 05/2014   mild dilation of Left Ventricle; Dr. Woody Seller  . Hashimoto's thyroiditis   . History of mammogram 10/10/14   normal  . Multinodular goiter   . Noncompliance with diagnostic testing    due to severe anxiety  . Osteoporosis   . Panic disorder    severe, doesn't drive, doesn't use stairs or elevators, severely limited by her anxiety  . Renal insufficiency   . Thyroid nodule 07/24/07   benign follicular nodule on biopsy    Past Surgical History:  Procedure Laterality Date  . COLONOSCOPY  2014   cologuard 2016; refuses colonoscopy  . TUBAL LIGATION  1986   Social History   Tobacco Use  . Smoking status: Former Smoker    Packs/day: 1.50    Years: 35.00    Pack years: 52.50    Types: Cigarettes    Quit date: 02/23/1992    Years since quitting: 27.5  . Smokeless tobacco: Never Used  Substance Use Topics  . Alcohol use: No   Marital Status: Married   Review of  Systems  Cardiovascular: Negative for chest pain, dyspnea on exertion and leg swelling.  Gastrointestinal: Negative for melena.  Psychiatric/Behavioral: Positive for depression. The patient is nervous/anxious.    Objective:  Blood pressure 123/80, pulse 80, height 5\' 7"  (1.702 m), weight 140 lb (63.5 kg), last menstrual period 02/23/1992, SpO2 94 %. Body mass index is 21.93 kg/m.   Vitals with BMI 09/20/2019 06/13/2019 12/12/2018  Height 5\' 7"  5\' 7"  5\' 7"   Weight 140 lbs 144 lbs 148 lbs 11 oz  BMI 21.92 32.35 57.32  Systolic 202 542 706  Diastolic 80 80 82  Pulse 80 74 78       Physical Exam Vitals reviewed.  Constitutional:      Appearance: She is well-developed.  Cardiovascular:     Rate and Rhythm: Normal rate. Rhythm irregularly irregular.     Pulses:          Carotid pulses are 2+ on the right side and 2+ on the left side.      Femoral pulses are 2+ on the right side and 2+ on the left side.      Popliteal pulses are 2+ on the right side and 2+ on the left side.       Dorsalis pedis pulses are 1+ on the right side and 1+ on the left side.       Posterior  tibial pulses are 1+ on the right side and 1+ on the left side.     Heart sounds: Normal heart sounds.     Comments: Acrocyanosis bilaterally. No pedal edema. No JVD. Pulmonary:     Effort: Pulmonary effort is normal. No accessory muscle usage or respiratory distress.     Breath sounds: Normal breath sounds.  Abdominal:     General: Bowel sounds are normal.     Palpations: Abdomen is soft.  Musculoskeletal:        General: Normal range of motion.     Radiology: No results found.  Laboratory examination:    CMP Latest Ref Rng & Units 09/18/2019 12/07/2018 10/26/2018  Glucose 65 - 99 mg/dL 100(H) - 109(H)  BUN 8 - 27 mg/dL 41(H) - 44(H)  Creatinine 0.57 - 1.00 mg/dL 0.99 - 1.04(H)  Sodium 134 - 144 mmol/L 140 - 140  Potassium 3.5 - 5.2 mmol/L 4.5 - 4.5  Chloride 96 - 106 mmol/L 100 - 103  CO2 20 - 29 mmol/L 24 -  25  Calcium 8.7 - 10.3 mg/dL 9.7 - 9.1  Total Protein 6.0 - 8.5 g/dL 6.9 6.8 -  Total Bilirubin 0.0 - 1.2 mg/dL 0.7 0.7 -  Alkaline Phos 48 - 121 IU/L 109 105 -  AST 0 - 40 IU/L 35 30 -  ALT 0 - 32 IU/L 50(H) 42(H) -   CBC Latest Ref Rng & Units 09/18/2019 12/07/2018 09/14/2017  WBC 3.4 - 10.8 x10E3/uL 5.6 6.4 6.3  Hemoglobin 11.1 - 15.9 g/dL 16.4(H) 16.3(H) 15.5  Hematocrit 34.0 - 46.6 % 49.7(H) 50.9(H) 48.2(H)  Platelets 150 - 450 x10E3/uL 170 173 178   Lipid Panel     Component Value Date/Time   CHOL 228 (H) 09/18/2019 0818   TRIG 71 09/18/2019 0818   HDL 81 09/18/2019 0818   CHOLHDL 2.7 12/07/2018 1025   CHOLHDL 2.5 09/01/2016 0818   VLDL 12 09/01/2016 0818   LDLCALC 135 (H) 09/18/2019 0818   HEMOGLOBIN A1C No results found for: HGBA1C, MPG TSH Recent Labs    12/07/18 1025  TSH 3.370   PRN Meds:. Medications Discontinued During This Encounter  Medication Reason  . valsartan (DIOVAN) 80 MG tablet Patient Preference   Current Meds  Medication Sig  . cholecalciferol (VITAMIN D) 400 UNITS TABS Take 400 Units by mouth.  . folic acid (FOLVITE) 1 MG tablet Take 1 tablet (1 mg total) by mouth daily.  Marland Kitchen levothyroxine (SYNTHROID) 100 MCG tablet TAKE 1 TABLET BY MOUTH EVERY DAY BEFORE BREAKFAST  . metoprolol tartrate (LOPRESSOR) 50 MG tablet TAKE 1 TABLET BY MOUTH TWICE A DAY  . Rivaroxaban (XARELTO) 15 MG TABS tablet Take 1 tablet (15 mg total) by mouth once for 1 dose.   Cardiac Studies:   Lexiscan myoview stress test 09/05/2017: 1. Lexiscan stress test was performed. Exercise capacity was not assessed. Stress symptoms included headache, "panic like symptoms". Peak blood pressure was 162/102 mmHg. The resting and stress electrocardiogram demonstrated atrial fibrillation with rapid ventricular response, RBBB + LAHB, no resting arrhythmias and normal rest repolarization. Stress EKG is non diagnostic for ischemia as it is a pharmacologic stress. 2. The overall quality of the  study is good. Left ventricular cavity is noted to be normal on the rest and stress studies. Gated SPECT images reveal global reduction in normal myocardial thickening and wall motion. The left ventricular ejection fraction was calculated or visually estimated to be 26%. LVEF could be underestimated due to gating difficulties during  Afib w/RVR. Review of the raw data in a rotational cine format reveals breast attenuation with imaging performed in sitting position. REST and STRESS images demonstrate small sized area of mildly decreased tracer uptake in the basal inferoseptal, basal inferior, mid inferoseptal and mid inferior segments of the left ventricle with no reversibility. Findings most likely represent breast attenuation artifact. 3. High risk study due to reduced LVEF. Recommend echocardiogram, preferably with controlled ventricular rate, for accurate LVEF estimation.  Lower Extremity Arterial Duplex 11/03/2018:  No hemodynamically significant stenoses are identified in the  lower extremity arterial system.  This exam reveals moderately decreased perfusion of the right lower extremity, noted at the post tibial artery level (ABI 0.75). This exam reveals moderately decreased perfusion of the left lower extremity, noted at the dorsalis pedis artery level (ABI 0.75).  Study suggests diffuse disease.  Echocardiogram 06/27/2019:  Severely depressed LV systolic function with visual EF 20-25%. Left  ventricle cavity is dilated. Hypokinetic global wall motion. Unable to  evaluate diastolic function due to atrial fibrillation.  Left atrial cavity is moderately dilated.  Right atrial cavity is mild to moderately dilated.  Moderate (Grade II) mitral regurgitation.  Moderate tricuspid regurgitation.  IVC is dilated with a respiratory response of >50%.  Compared to prior study LV function has reduced from 30-35% to 20-25%.  May consider limited echo with definity (plus Simpson's method) or MUGA  scan to  re-evaluate LVEF  EKG:  EKG 09/20/2019: Atrial fibrillation with controlled ventricular response at rate of 79 bpm, left axis deviation, left anterior fascicular block.  Incomplete right bundle branch block.  IVCD, borderline criteria for LVH.  Poor R wave progression, cannot exclude anteroseptal infarct old.  Nonspecific T abnormality.  No significant change from 06/13/2019.  Assessment:     ICD-10-CM   1. Chronic systolic heart failure (HCC)  I50.22 EKG 12-Lead    valsartan (DIOVAN) 80 MG tablet    PCV ECHOCARDIOGRAM COMPLETE  2. Nonischemic cardiomyopathy (HCC)  I42.8 PCV ECHOCARDIOGRAM COMPLETE  3. Permanent atrial fibrillation ([place]). CHA2DS2-VASc Score is 5.  Yearly risk of stroke: 6.7% (A, F, HTN, PAD, CHF).      I48.21   4. Stage 3a chronic kidney disease  A35.57 Basic metabolic panel    Basic metabolic panel   Recommendations:   Rita Wright  is a 69 y.o. Caucasian female patient with hashimoto's thyroiditis, osteoporosis, former tobacco use, non-ischemic cardiomyopathy and permanent atrial fibrillation. Per her wishes , she did not have cardioversion, ablation, or try antiarrhythmic therapy. She also did not want to try heart failure medications. She is on rate control therapy with Metoprolol 50mg  BID that she is tolerating well.   She is on Xarelto for anticoagulation and tolerating this well.  She previously had had a positive Cologuard test was scheduled for colonoscopy, but as it had to be postponed, she decided not to have it.  In regard to cardiomyopathy, she is essentially asymptomatic. I had recommended that she be on valsartan at least at her last office visit to hopefully help with her low EF, she has not started this.  I had also prescribed her Crestor just 5 mg daily.  After discussions about the recent echocardiogram, she is willing to try the medications, she will start with Diovan first.  She indeed have stage III chronic kidney disease we will check a BMP in 2 weeks.   If she tolerates this medication, then she will start with Crestor 5 mg daily.  I will see her  back in 6 months or sooner if problems.   Adrian Prows, MD, [facility] 09/20/2019, 10:11 PM Office: [phone]

## 2019-10-11 DIAGNOSIS — Z7901 Long term (current) use of anticoagulants: Secondary | ICD-10-CM | POA: Diagnosis not present

## 2019-10-11 DIAGNOSIS — I130 Hypertensive heart and chronic kidney disease with heart failure and stage 1 through stage 4 chronic kidney disease, or unspecified chronic kidney disease: Secondary | ICD-10-CM | POA: Diagnosis not present

## 2019-10-11 DIAGNOSIS — I4821 Permanent atrial fibrillation: Secondary | ICD-10-CM | POA: Diagnosis not present

## 2019-10-11 DIAGNOSIS — I5022 Chronic systolic (congestive) heart failure: Secondary | ICD-10-CM | POA: Diagnosis not present

## 2019-10-11 DIAGNOSIS — M810 Age-related osteoporosis without current pathological fracture: Secondary | ICD-10-CM | POA: Diagnosis not present

## 2019-10-11 DIAGNOSIS — Z87891 Personal history of nicotine dependence: Secondary | ICD-10-CM | POA: Diagnosis not present

## 2019-10-11 DIAGNOSIS — E069 Thyroiditis, unspecified: Secondary | ICD-10-CM | POA: Diagnosis not present

## 2019-10-11 DIAGNOSIS — N1831 Chronic kidney disease, stage 3a: Secondary | ICD-10-CM | POA: Diagnosis not present

## 2019-10-16 DIAGNOSIS — N1831 Chronic kidney disease, stage 3a: Secondary | ICD-10-CM | POA: Diagnosis not present

## 2019-10-16 DIAGNOSIS — I4821 Permanent atrial fibrillation: Secondary | ICD-10-CM | POA: Diagnosis not present

## 2019-10-16 DIAGNOSIS — E069 Thyroiditis, unspecified: Secondary | ICD-10-CM | POA: Diagnosis not present

## 2019-10-16 DIAGNOSIS — I130 Hypertensive heart and chronic kidney disease with heart failure and stage 1 through stage 4 chronic kidney disease, or unspecified chronic kidney disease: Secondary | ICD-10-CM | POA: Diagnosis not present

## 2019-10-16 DIAGNOSIS — M810 Age-related osteoporosis without current pathological fracture: Secondary | ICD-10-CM | POA: Diagnosis not present

## 2019-10-16 DIAGNOSIS — I5022 Chronic systolic (congestive) heart failure: Secondary | ICD-10-CM | POA: Diagnosis not present

## 2019-10-17 ENCOUNTER — Telehealth: Payer: Self-pay | Admitting: Cardiology

## 2019-10-17 DIAGNOSIS — I5022 Chronic systolic (congestive) heart failure: Secondary | ICD-10-CM | POA: Diagnosis not present

## 2019-10-17 DIAGNOSIS — I739 Peripheral vascular disease, unspecified: Secondary | ICD-10-CM | POA: Diagnosis not present

## 2019-10-17 DIAGNOSIS — I4821 Permanent atrial fibrillation: Secondary | ICD-10-CM | POA: Diagnosis not present

## 2019-10-17 DIAGNOSIS — I428 Other cardiomyopathies: Secondary | ICD-10-CM | POA: Diagnosis not present

## 2019-10-17 NOTE — Telephone Encounter (Signed)
Reviewed Home health plan of care and signed off.    ICD-10-CM   1. Chronic systolic heart failure (HCC)  I50.22   2. Nonischemic cardiomyopathy (HCC)  I42.8   3. Permanent atrial fibrillation ([place]). CHA2DS2-VASc Score is 5.  Yearly risk of stroke: 6.7% (A, F, HTN, PAD, CHF).      I48.21   4. PAD (peripheral artery disease) ([place])  I73.9      Adrian Prows, MD, [facility] 10/17/2019, 1:29 PM Office: [phone]

## 2019-10-18 DIAGNOSIS — N1831 Chronic kidney disease, stage 3a: Secondary | ICD-10-CM | POA: Diagnosis not present

## 2019-10-18 DIAGNOSIS — I4821 Permanent atrial fibrillation: Secondary | ICD-10-CM | POA: Diagnosis not present

## 2019-10-18 DIAGNOSIS — E069 Thyroiditis, unspecified: Secondary | ICD-10-CM | POA: Diagnosis not present

## 2019-10-18 DIAGNOSIS — I5022 Chronic systolic (congestive) heart failure: Secondary | ICD-10-CM | POA: Diagnosis not present

## 2019-10-18 DIAGNOSIS — I130 Hypertensive heart and chronic kidney disease with heart failure and stage 1 through stage 4 chronic kidney disease, or unspecified chronic kidney disease: Secondary | ICD-10-CM | POA: Diagnosis not present

## 2019-10-18 DIAGNOSIS — M810 Age-related osteoporosis without current pathological fracture: Secondary | ICD-10-CM | POA: Diagnosis not present

## 2019-10-23 DIAGNOSIS — E069 Thyroiditis, unspecified: Secondary | ICD-10-CM | POA: Diagnosis not present

## 2019-10-23 DIAGNOSIS — I130 Hypertensive heart and chronic kidney disease with heart failure and stage 1 through stage 4 chronic kidney disease, or unspecified chronic kidney disease: Secondary | ICD-10-CM | POA: Diagnosis not present

## 2019-10-23 DIAGNOSIS — M810 Age-related osteoporosis without current pathological fracture: Secondary | ICD-10-CM | POA: Diagnosis not present

## 2019-10-23 DIAGNOSIS — I4821 Permanent atrial fibrillation: Secondary | ICD-10-CM | POA: Diagnosis not present

## 2019-10-23 DIAGNOSIS — I5022 Chronic systolic (congestive) heart failure: Secondary | ICD-10-CM | POA: Diagnosis not present

## 2019-10-23 DIAGNOSIS — N1831 Chronic kidney disease, stage 3a: Secondary | ICD-10-CM | POA: Diagnosis not present

## 2019-10-30 ENCOUNTER — Other Ambulatory Visit: Payer: Self-pay

## 2019-10-30 DIAGNOSIS — I130 Hypertensive heart and chronic kidney disease with heart failure and stage 1 through stage 4 chronic kidney disease, or unspecified chronic kidney disease: Secondary | ICD-10-CM | POA: Diagnosis not present

## 2019-10-30 DIAGNOSIS — I4821 Permanent atrial fibrillation: Secondary | ICD-10-CM | POA: Diagnosis not present

## 2019-10-30 DIAGNOSIS — I5022 Chronic systolic (congestive) heart failure: Secondary | ICD-10-CM

## 2019-10-30 DIAGNOSIS — E069 Thyroiditis, unspecified: Secondary | ICD-10-CM | POA: Diagnosis not present

## 2019-10-30 DIAGNOSIS — M810 Age-related osteoporosis without current pathological fracture: Secondary | ICD-10-CM | POA: Diagnosis not present

## 2019-10-30 DIAGNOSIS — N1831 Chronic kidney disease, stage 3a: Secondary | ICD-10-CM | POA: Diagnosis not present

## 2019-10-30 MED ORDER — VALSARTAN 80 MG PO TABS: 80.0000 mg | ORAL_TABLET | Freq: Every day | ORAL | 1 refills | Status: DC

## 2019-11-07 ENCOUNTER — Encounter: Payer: Self-pay | Admitting: Cardiology

## 2019-11-07 ENCOUNTER — Other Ambulatory Visit: Payer: Self-pay | Admitting: Cardiology

## 2019-11-07 DIAGNOSIS — N1831 Chronic kidney disease, stage 3a: Secondary | ICD-10-CM | POA: Diagnosis not present

## 2019-11-07 NOTE — Telephone Encounter (Signed)
Patient complains of cough on valsartan.  She is asymptomatic and she was hesitant to starting the medication, I have discontinued the valsartan.   Adrian Prows, MD, [facility] 11/07/2019, 10:59 PM Office: [phone]

## 2019-11-08 ENCOUNTER — Other Ambulatory Visit: Payer: Self-pay | Admitting: Cardiology

## 2019-11-08 DIAGNOSIS — I4891 Unspecified atrial fibrillation: Secondary | ICD-10-CM

## 2019-11-08 LAB — BASIC METABOLIC PANEL
BUN/Creatinine Ratio: 45 — ABNORMAL HIGH (ref 12–28)
BUN: 48 mg/dL — ABNORMAL HIGH (ref 8–27)
CO2: 28 mmol/L (ref 20–29)
Calcium: 9.1 mg/dL (ref 8.7–10.3)
Chloride: 101 mmol/L (ref 96–106)
Creatinine, Ser: 1.06 mg/dL — ABNORMAL HIGH (ref 0.57–1.00)
GFR calc Af Amer: 61 mL/min/{1.73_m2} (ref >59)
GFR calc non Af Amer: 53 mL/min/{1.73_m2} — ABNORMAL LOW (ref >59)
Glucose: 110 mg/dL — ABNORMAL HIGH (ref 65–99)
Potassium: 4.7 mmol/L (ref 3.5–5.2)
Sodium: 139 mmol/L (ref 134–144)

## 2019-12-10 ENCOUNTER — Ambulatory Visit (INDEPENDENT_AMBULATORY_CARE_PROVIDER_SITE_OTHER): Payer: Medicare Other | Admitting: Medical

## 2019-12-10 ENCOUNTER — Encounter: Payer: Self-pay | Admitting: Medical

## 2019-12-10 ENCOUNTER — Other Ambulatory Visit: Payer: Self-pay

## 2019-12-10 ENCOUNTER — Telehealth: Payer: Self-pay | Admitting: Medical

## 2019-12-10 VITALS — BP 120/70 | HR 88 | Ht 67.0 in | Wt 142.0 lb

## 2019-12-10 DIAGNOSIS — Z7185 Encounter for immunization safety counseling: Secondary | ICD-10-CM | POA: Diagnosis not present

## 2019-12-10 DIAGNOSIS — W19XXXA Unspecified fall, initial encounter: Secondary | ICD-10-CM | POA: Insufficient documentation

## 2019-12-10 DIAGNOSIS — F410 Panic disorder [episodic paroxysmal anxiety] without agoraphobia: Secondary | ICD-10-CM

## 2019-12-10 DIAGNOSIS — E039 Hypothyroidism, unspecified: Secondary | ICD-10-CM | POA: Diagnosis not present

## 2019-12-10 DIAGNOSIS — I739 Peripheral vascular disease, unspecified: Secondary | ICD-10-CM | POA: Insufficient documentation

## 2019-12-10 DIAGNOSIS — Z1231 Encounter for screening mammogram for malignant neoplasm of breast: Secondary | ICD-10-CM

## 2019-12-10 DIAGNOSIS — F411 Generalized anxiety disorder: Secondary | ICD-10-CM

## 2019-12-10 DIAGNOSIS — I420 Dilated cardiomyopathy: Secondary | ICD-10-CM

## 2019-12-10 DIAGNOSIS — R7989 Other specified abnormal findings of blood chemistry: Secondary | ICD-10-CM

## 2019-12-10 DIAGNOSIS — Z0000 Encounter for general adult medical examination without abnormal findings: Secondary | ICD-10-CM

## 2019-12-10 DIAGNOSIS — N289 Disorder of kidney and ureter, unspecified: Secondary | ICD-10-CM

## 2019-12-10 DIAGNOSIS — Z780 Asymptomatic menopausal state: Secondary | ICD-10-CM

## 2019-12-10 DIAGNOSIS — Z23 Encounter for immunization: Secondary | ICD-10-CM | POA: Diagnosis not present

## 2019-12-10 DIAGNOSIS — R61 Generalized hyperhidrosis: Secondary | ICD-10-CM

## 2019-12-10 DIAGNOSIS — K219 Gastro-esophageal reflux disease without esophagitis: Secondary | ICD-10-CM | POA: Diagnosis not present

## 2019-12-10 DIAGNOSIS — E2839 Other primary ovarian failure: Secondary | ICD-10-CM | POA: Diagnosis not present

## 2019-12-10 DIAGNOSIS — Z9119 Patient's noncompliance with other medical treatment and regimen: Secondary | ICD-10-CM

## 2019-12-10 DIAGNOSIS — D582 Other hemoglobinopathies: Secondary | ICD-10-CM

## 2019-12-10 DIAGNOSIS — Z87891 Personal history of nicotine dependence: Secondary | ICD-10-CM

## 2019-12-10 DIAGNOSIS — R5383 Other fatigue: Secondary | ICD-10-CM

## 2019-12-10 DIAGNOSIS — K529 Noninfective gastroenteritis and colitis, unspecified: Secondary | ICD-10-CM | POA: Diagnosis not present

## 2019-12-10 DIAGNOSIS — Z7189 Other specified counseling: Secondary | ICD-10-CM | POA: Insufficient documentation

## 2019-12-10 DIAGNOSIS — K9049 Malabsorption due to intolerance, not elsewhere classified: Secondary | ICD-10-CM

## 2019-12-10 DIAGNOSIS — Z91199 Patient's noncompliance with other medical treatment and regimen due to unspecified reason: Secondary | ICD-10-CM

## 2019-12-10 DIAGNOSIS — M810 Age-related osteoporosis without current pathological fracture: Secondary | ICD-10-CM

## 2019-12-10 DIAGNOSIS — I4821 Permanent atrial fibrillation: Secondary | ICD-10-CM | POA: Insufficient documentation

## 2019-12-10 DIAGNOSIS — Z9114 Patient's other noncompliance with medication regimen: Secondary | ICD-10-CM

## 2019-12-10 NOTE — Progress Notes (Signed)
Subjective:    Rita Wright is a 69 y.o. female who presents for Preventative Services visit and chronic medical problems/med check visit.    Primary Care Provider Adalie Mand, Camelia Eng, PA-C here for primary care  Current Health Care Team:  Dentist, Dentures  Eye doctor, Dr. Jabier Mutton and Dr. Zadie Rhine  Dr. Adrian Prows, cardiology   Medical Services you may have received from other than [facility] providers in the past year (date may be approximate) cardiology   Exercise Current exercise habits: The patient does not participate in regular exercise at present.   Nutrition/Diet Current diet: she tries to eat mostly balanced   Depression Screen Depression screen [facility] 2/9 12/10/2019  Decreased Interest 0  Down, Depressed, Hopeless 1  PHQ - 2 Score 1  Altered sleeping -  Tired, decreased energy -  Change in appetite -  Feeling bad or failure about yourself  -  Trouble concentrating -  Moving slowly or fidgety/restless -  Suicidal thoughts -  PHQ-9 Score -  Difficult doing work/chores -    Activities of Daily Living Screen/Functional Status Survey Is the patient deaf or have difficulty hearing?: No Does the patient have difficulty seeing, even when wearing glasses/contacts?: No Does the patient have difficulty concentrating, remembering, or making decisions?: No Does the patient have difficulty walking or climbing stairs?: No Does the patient have difficulty dressing or bathing?: No Does the patient have difficulty doing errands alone such as visiting a doctor's office or shopping?: No  Can patient draw a clock face showing 3:15 oclock, yes  Fall Risk Screen Fall Risk  12/10/2019 12/07/2018 12/05/2017 09/19/2017 09/01/2016  Falls in the past year? 1 0 No No No  Comment - - - - -  Number falls in past yr: 0 - - - -  Injury with Fall? 1 - - - -  Comment bruise on right arm - - - -  Risk for fall due to : No Fall Risks - - - -  Follow up Falls evaluation completed - - - -    Gait  Assessment: Normal gait observed - yes  Advanced directives Does patient have a [place]? No Does patient have a Living Will? No  Past Medical History:  Diagnosis Date  . Colon cancer screening 09/2014   Cologuard test negative   . Depression   . Dilated cardiomyopathy ([place]) 05/2014   Dr. Woody Seller  . Echocardiogram abnormal 05/2014   mild dilation of Left Ventricle; Dr. Woody Seller  . Hashimoto's thyroiditis   . History of mammogram 10/10/14   normal  . Multinodular goiter   . Noncompliance with diagnostic testing    due to severe anxiety  . Osteoporosis   . Panic disorder    severe, doesn't drive, doesn't use stairs or elevators, severely limited by her anxiety  . Renal insufficiency   . Thyroid nodule 1/61/09   benign follicular nodule on biopsy    Past Surgical History:  Procedure Laterality Date  . COLONOSCOPY  2014   cologuard 2016; refuses colonoscopy  . TUBAL LIGATION  1986      Family History  Problem Relation Age of Onset  . Hypertension Mother   . Parkinson's disease Father   . Heart failure Sister   . Hypertension Brother   . Cerebral aneurysm Sister   . Throat cancer Brother      Current Outpatient Medications:  .  cholecalciferol (VITAMIN D) 400 UNITS TABS, Take 400 Units by mouth., Disp: , Rfl:  .  folic acid (FOLVITE) 1 MG tablet, Take 1 tablet (1 mg total) by mouth daily., Disp: 90 tablet, Rfl: 3 .  levothyroxine (SYNTHROID) 100 MCG tablet, TAKE 1 TABLET BY MOUTH EVERY DAY BEFORE BREAKFAST, Disp: 90 tablet, Rfl: 2 .  metoprolol tartrate (LOPRESSOR) 50 MG tablet, TAKE 1 TABLET BY MOUTH TWICE A DAY, Disp: 180 tablet, Rfl: 1 .  Rivaroxaban (XARELTO) 15 MG TABS tablet, Take 1 tablet (15 mg total) by mouth once for 1 dose., Disp: 90 tablet, Rfl: 3 .  rosuvastatin (CRESTOR) 5 MG tablet, Take 1 tablet (5 mg total) by mouth daily. (Patient not taking: Reported on 09/20/2019), Disp: 90 tablet, Rfl: 1  Allergies  Allergen Reactions  . Valsartan  Cough  . Boniva [Ibandronic Acid]     Horrible joint pains  . Erythromycin     GI upset  . Prednisone Other (See Comments)    Jittery, insomnia, intolerance  . Penicillins Hives, Rash and Other (See Comments)    Felt like she was going to pass out.    History reviewed: allergies, current medications, past family history, past medical history, past social history, past surgical history and problem list  Chronic issues discussed: She saw cardiology recently.  She made a deal with them that she would take valsartan initially before starting back on Crestor..  She ended up getting some congestion runny nose that she thought was attributable to the valsartan hand.  She called cardiology and they said she could stop this.  However the congestion persisted after stopping the medicine.  She has not yet restarted valsartan.  She still has congestion now attributed to allergies.  Not currently taking anything for this  She has not begun Crestor yet advised by cardiology  She notes compliance with the rest of her medicines particular thyroid  Currently and in the past she has been adamant about not going through with colonoscopy.  She had a recent positive Cologuard.  She actually had agreed to do a colonoscopy but there was a bit of a issue when she went in for the colonoscopy is there was miscommunication, and she left upset dying once again not to do colonoscopy.  Apparently gastroenterology felt like her cardiac risks were too great to do her study they are in their office so when they wanted to have her go to the hospital and do the procedure there she declined.  She has been reluctant in recent years to do a colonoscopy in general  She has extreme anxiety, panic attacks.  She will not use a facility that is not 1 level.  She will not use stairs or elevator.  She notes prior bad experiences with medical practices which influences her decision to do or not do certain tests   Acute issues  discussed: none    Objective:     Biometrics BP 120/70   Pulse 88   Ht 5\' 7"  (1.702 m)   Wt 142 lb (64.4 kg)   LMP 02/23/1992   SpO2 98%   BMI 22.24 kg/m   Cognitive Testing  Alert? Yes  Normal Appearance?Yes  Oriented to person? Yes  Place? Yes   Time? Yes  Recall of three objects?  Yes  Can perform simple calculations? Yes  Displays appropriate judgment?Yes  Can read the correct time from a watch face?Yes  General appearance: alert, no distress, WD/WN, white female  Nutritional Status: Inadequate calore intake? no Loss of muscle mass? no Loss of fat beneath skin? yes Localized or general edema? no  Diminished functional status? no  Other pertinent exam: HEENT: normocephalic, sclerae anicteric, TMs pearly, nares patent, no discharge or erythema, pharynx normal Oral cavity: MMM, no lesions Neck: supple, no lymphadenopathy, no thyromegaly, no masses, no bruits Heart: irregular, no murmurs Lungs: CTA bilaterally, no wheezes, rhonchi, or rales Abdomen: +bs, soft, non tender, non distended, no masses, no hepatomegaly, no splenomegaly Musculoskeletal: nontender, no swelling, no obvious deformity Extremities: no edema, no cyanosis, no clubbing Pulses: 1+ LE pulses, 2+ upper symmetric extremities, normal cap refill Neurological: alert, oriented x 3, CN2-12 intact, strength normal upper extremities and lower extremities, sensation normal throughout, DTRs 2+ throughout, no cerebellar signs, gait normal Psychiatric: normal affect, behavior normal, pleasant  Breast/gyn - declined    Assessment:   Encounter Diagnoses  Name Primary?  . Dilated cardiomyopathy ([place]) Yes  . Medicare annual wellness visit, subsequent   . Need for influenza vaccination   . Vaccine counseling   . Noncompliance with diagnostic testing   . Generalized anxiety disorder   . Hypothyroidism, unspecified type   . Gastroesophageal reflux disease without esophagitis   . Chronic diarrhea   .  Osteoporosis, unspecified osteoporosis type, unspecified pathological fracture presence   . Renal insufficiency   . Encounter for screening mammogram for malignant neoplasm of breast   . Estrogen deficiency   . Excessive sweating   . Fatigue, unspecified type   . Former smoker   . Milk intolerance   . Noncompliance with medication regimen   . Panic disorder   . Post-menopausal   . PAD (peripheral artery disease) ([place])   . Advanced directives, counseling/discussion   . Permanent atrial fibrillation ([place])   . Fall, initial encounter   . LFT elevation      Plan:  Patient was given a prescription for Shingrix and Tdap to take to [place].   A preventative services visit was completed today.  During the course of the visit today, we discussed and counseled about appropriate screening and preventive services.  A health risk assessment was established today that included a review of current medications, allergies, social history, family history, medical and preventative health history, biometrics, and preventative screenings to identify potential safety concerns or impairments.  A personalized plan was printed today for your records and use.   Personalized health advice and education was given today to reduce health risks and promote self management and wellness.  Information regarding end of life planning was discussed today.  Vaccine counseling: I recommend an updated tetanus vaccine, shingles vaccine and flu vaccine You are up-to-date on pneumococcal vaccine and Covid vaccine We discussed the vaccine booster for Covid I gave her prescriptions to take to [facility] in case she can get the shingles and tetanus Tdap vaccine done there cheaper  Counseled on the influenza virus vaccine.  Vaccine information sheet given.   High dose Influenza vaccine given after consent obtained.   Cancer screening: Colon cancer screening  I know you have been reluctant to do this for a variety of  reasons  You do have a positive Cologuard which means you could have blood in the stool or potentially colon cancer  I still recommend you do a follow-up and have the colonoscopy done to rule out anything worrisome  Please reconsider moving forward with colonoscopy  Breast cancer screening  I recommend you continue self breast exams at home monthly  I recommend an updated mammogram  We will try to find a location that does this on the first floor only  Skin cancer screening  Check your skin regularly for new moles or lumps  Cervical cancer screening  For low risk patients with prior normal Pap smear screening that was adequate, it is recommended not to continue Pap smears beyond age 71    Chronic problems discussed today: Severe anxiety and panic disorder -she has extreme fear of elevators and stairs so will only use a 1 level building.  This is complicated trying to do mammograms and other testing in the past.  We will again work with her to try to find a solution on things like getting her mammogram and bone density test.  She refuses to see a counselor and has been reluctant to use any specific treatment for anxiety.  So she just deals with her issues which she has been dealing with long-term.  Cardiomyopathy, permanent atrial fibrillation  Continue regular follow-up with cardiology  Continue metoprolol 50 mg twice daily  I do recommend you restart the medication recently but done by Dr. Einar Gip to help strengthen the heart called Valsartan  Due to known peripheral vascular disease and heart disease, it is recommended you go ahead and get on the Crestor cholesterol medicine to help reduce risk of additional cardiac events and to help decreased further blockages in the arteries  I recommend you walk daily for exercise, and any other recommendations given by your cardiologist for exercise  Hypothyroidism  Continue your current medicine first thing in the morning daily on  empty stomach 45 minutes before breakfast  We are checking routine labs today  Elevated liver test  Last year we screen for hepatitis infection which was negative  We have not fully evaluated this issue  The next step would be ultrasound of the liver which I recommend you have done  Your recent liver test done by cardiology were stable  I suspect you could have fatty liver disease.  The recommendation for this is healthy low-fat diet and regular exercise  History of chronic diarrhea with no recent complaints  History of milk intolerance with no recent complaints  Chronic kidney disease-I reviewed recent stable labs in the chart record, avoid NSAIDs, continue to keep pressure in acceptable ranges  History of fall-discussed fall prevention and safety  Strongly recommended she has been go ahead and work on their wheels and advanced directives as they have not done this yet    Acute problems discussed today: None   Other general screenings and preventative care:  I recommend a yearly ophthalmology/optometry visit for glaucoma screening and eye checkup  I recommended a yearly dental visit for hygiene and checkup Advanced directives   If you have not completed a living will and healthcare power of attorney, please complete these and get [place] a copy.  I also recommend you update your last one test but if this has not been done.   You are due for bone density test.  I recommend we schedule this.  We will check for a first floor building to do this.   Referrals today: Will check into 1st floor buildings where she would feel less anxious doing mammogram and other preventative issues.    Sierah was seen today for [employer].  Diagnoses and all orders for this visit:  Dilated cardiomyopathy ([place])  Medicare annual wellness visit, subsequent  Need for influenza vaccination -     Flu Vaccine QUAD High Dose(Fluad)  Vaccine counseling  Noncompliance with diagnostic  testing  Generalized anxiety disorder  Hypothyroidism, unspecified type -  TSH -     T4, free -     CBC with Differential/Platelet -     Iron and TIBC  Gastroesophageal reflux disease without esophagitis  Chronic diarrhea -     CBC with Differential/Platelet  Osteoporosis, unspecified osteoporosis type, unspecified pathological fracture presence -     DG Bone Density; Future  Renal insufficiency -     CBC with Differential/Platelet -     Iron and TIBC  Encounter for screening mammogram for malignant neoplasm of breast -     MM DIGITAL SCREENING BILATERAL; Future  Estrogen deficiency -     DG Bone Density; Future  Excessive sweating  Fatigue, unspecified type  Former smoker  Milk intolerance  Noncompliance with medication regimen  Panic disorder  Post-menopausal  PAD (peripheral artery disease) ([place])  Advanced directives, counseling/discussion  Permanent atrial fibrillation ([place])  Fall, initial encounter  LFT elevation -     US Abdomen Complete; Future   Spent >60 minutes face to face with patient in pre-charting, face to face reviewed of chart, concerns, evaluation, and plan and recommendations.  Spend additional time after visit in scheduling and arranging imaging and appointments.    Medicare Attestation A preventative services visit was completed today.  During the course of the visit the patient was educated and counseled about appropriate screening and preventive services.  A health risk assessment was established with the patient that included a review of current medications, allergies, social history, family history, medical and preventative health history, biometrics, and preventative screenings to identify potential safety concerns or impairments.  A personalized plan was printed today for the patient's records and use.   Personalized health advice and education was given today to reduce health risks and promote self management and wellness.   Information regarding end of life planning was discussed today.  Dorothea Ogle, PA-C   12/10/2019

## 2019-12-10 NOTE — Telephone Encounter (Signed)
Please call and find out if [facility] or breast center has other locations where she can do a bone density scan and mammogram that is a 1 level building.  She has extreme anxiety including fear of heights.  She will not use a building that is two-story or requires elevator stairs.  I was thinking 1 of those locations would have a first floor only building including the parking being 1 level not a parking deck.  She needs bone density test and mammogram.  It would be even helpful to schedule this for her if there is a location that will work   Similarly she is due for an ultrasound of the liver giving elevated liver test.  But this will have to be a 1 level location if you help me with this.  If she would be agreeable she could go into [place] for the ultrasound which I believe would all be 1 level

## 2019-12-10 NOTE — Progress Notes (Signed)
done

## 2019-12-10 NOTE — Patient Instructions (Signed)
  Vaccine counseling: I recommend an updated tetanus vaccine, shingles vaccine and flu vaccine You are up-to-date on pneumococcal vaccine and Covid vaccine We discussed the vaccine booster for Covid I gave her prescriptions to take to the [facility] in case she can get the shingles and tetanus Tdap vaccine done there cheaper  Counseled on the influenza virus vaccine.  Vaccine information sheet given.   High dose Influenza vaccine given after consent obtained.   Cancer screening: Colon cancer screening  I know you have been reluctant to do this for a variety of reasons  You do have a positive Cologuard which means you could have blood in the stool or potentially colon cancer  I still recommend you do a follow-up and have the colonoscopy done to rule out anything worrisome  Please reconsider moving forward with colonoscopy  Breast cancer screening  I recommend you continue self breast exams at home monthly  I recommend an updated mammogram  We will try to find a location that does this on the first floor only  Skin cancer screening  Check your skin regularly for new moles or lumps  Cervical cancer screening  For low risk patients with prior normal Pap smear screening that was adequate, it is recommended not to continue Pap smears beyond age 33    Chronic problems discussed today: Cardiomyopathy, permanent atrial fibrillation  Continue regular follow-up with cardiology  Continue metoprolol 50 mg twice daily  I do recommend you restart the medication recently but done by Dr. Einar Gip to help strengthen the heart called Valsartan  Due to known peripheral vascular disease and heart disease, it is recommended you go ahead and get on the Crestor cholesterol medicine to help reduce risk of additional cardiac events and to help decreased further blockages in the arteries  I recommend you walk daily for exercise, and any other recommendations given by your cardiologist for  exercise  Hypothyroidism  Continue your current medicine first thing in the morning daily on empty stomach 45 minutes before breakfast  We are checking routine labs today  Elevated liver test  Last year we screen for hepatitis infection which was negative  We have not fully evaluated this issue  The next step would be ultrasound of the liver which I recommend you have done  Your recent liver test done by cardiology were stable  I suspect you could have fatty liver disease.  The recommendation for this is healthy low-fat diet and regular exercise  History of chronic diarrhea with no recent complaints  History of milk intolerance with no recent complaints  Chronic kidney disease-I reviewed recent stable labs in the chart record, avoid NSAIDs, continue to keep pressure in acceptable ranges   Acute problems discussed today: None   Other general screenings and preventative care:  I recommend a yearly ophthalmology/optometry visit for glaucoma screening and eye checkup  I recommended a yearly dental visit for hygiene and checkup Advanced directives   If you have not completed a living will and healthcare power of attorney, please complete these and get [place] a copy.  I also recommend you update your last one test but if this has not been done.   You are due for bone density test.  I recommend we schedule this.  We will check for a first floor building to do this.   Referrals today: Will check into 1st floor buildings where she would feel less anxious doing mammogram and other preventative issues.

## 2019-12-11 ENCOUNTER — Other Ambulatory Visit: Payer: Self-pay | Admitting: Medical

## 2019-12-11 DIAGNOSIS — E039 Hypothyroidism, unspecified: Secondary | ICD-10-CM

## 2019-12-11 DIAGNOSIS — E2839 Other primary ovarian failure: Secondary | ICD-10-CM

## 2019-12-11 LAB — CBC WITH DIFFERENTIAL/PLATELET
Basophils Absolute: 0 10*3/uL (ref 0.0–0.2)
Basos: 1 %
EOS (ABSOLUTE): 0.2 10*3/uL (ref 0.0–0.4)
Eos: 3 %
Hematocrit: 48.9 % — ABNORMAL HIGH (ref 34.0–46.6)
Hemoglobin: 16.1 g/dL — ABNORMAL HIGH (ref 11.1–15.9)
Immature Grans (Abs): 0 10*3/uL (ref 0.0–0.1)
Immature Granulocytes: 0 %
Lymphocytes Absolute: 1.1 10*3/uL (ref 0.7–3.1)
Lymphs: 20 %
MCH: 31 pg (ref 26.6–33.0)
MCHC: 32.9 g/dL (ref 31.5–35.7)
MCV: 94 fL (ref 79–97)
Monocytes Absolute: 0.4 10*3/uL (ref 0.1–0.9)
Monocytes: 8 %
Neutrophils Absolute: 3.8 10*3/uL (ref 1.4–7.0)
Neutrophils: 68 %
Platelets: 168 10*3/uL (ref 150–450)
RBC: 5.19 x10E6/uL (ref 3.77–5.28)
RDW: 12 % (ref 11.7–15.4)
WBC: 5.5 10*3/uL (ref 3.4–10.8)

## 2019-12-11 LAB — T4, FREE: Free T4: 1.24 ng/dL (ref 0.82–1.77)

## 2019-12-11 LAB — IRON AND TIBC
Iron Saturation: 42 % (ref 15–55)
Iron: 124 ug/dL (ref 27–139)
Total Iron Binding Capacity: 297 ug/dL (ref 250–450)
UIBC: 173 ug/dL (ref 118–369)

## 2019-12-11 LAB — TSH: TSH: 5.2 u[IU]/mL — ABNORMAL HIGH (ref 0.450–4.500)

## 2019-12-11 MED ORDER — LEVOTHYROXINE SODIUM 100 MCG PO TABS: ORAL_TABLET | ORAL | 3 refills | Status: DC

## 2019-12-11 MED ORDER — FOLIC ACID 1 MG PO TABS: 1.0000 mg | ORAL_TABLET | Freq: Every day | ORAL | 3 refills | Status: DC

## 2019-12-27 ENCOUNTER — Other Ambulatory Visit: Payer: Self-pay

## 2019-12-27 NOTE — Telephone Encounter (Signed)
I wrote orders on prescription pad for mammogram and bone density.  Please submit this

## 2019-12-27 NOTE — Telephone Encounter (Signed)
Referring to [employer]. Please write orders on script.

## 2019-12-28 NOTE — Telephone Encounter (Signed)
Orders were sent to [facility] they informed they can do mammogram screening but they don't do bone density so if patient just want mammogram done, she can call and schedule. If patient prefer a place she can have both done she would have to go somewhere else but we are not sure of another facility that she won't have to use stairs or elevators

## 2019-12-28 NOTE — Telephone Encounter (Signed)
Lets at least get the mammogram done.   I have called several places as well and I can't find anyplace else that does theses tests in a 1 level building not requiring steps or elevators.

## 2020-01-02 NOTE — Telephone Encounter (Signed)
Spoke to patient and provided her the contact information for [employer] so she can call and schedule mammogram.

## 2020-01-21 DIAGNOSIS — D582 Other hemoglobinopathies: Secondary | ICD-10-CM | POA: Insufficient documentation

## 2020-01-30 ENCOUNTER — Encounter (INDEPENDENT_AMBULATORY_CARE_PROVIDER_SITE_OTHER): Payer: Self-pay | Admitting: Ophthalmology

## 2020-01-30 ENCOUNTER — Other Ambulatory Visit: Payer: Self-pay

## 2020-01-30 ENCOUNTER — Ambulatory Visit (INDEPENDENT_AMBULATORY_CARE_PROVIDER_SITE_OTHER): Payer: Medicare Other | Admitting: Ophthalmology

## 2020-01-30 DIAGNOSIS — D3131 Benign neoplasm of right choroid: Secondary | ICD-10-CM | POA: Insufficient documentation

## 2020-01-30 DIAGNOSIS — H2513 Age-related nuclear cataract, bilateral: Secondary | ICD-10-CM

## 2020-01-30 NOTE — Progress Notes (Signed)
01/30/2020     CHIEF COMPLAINT Patient presents for Retina Follow Up   HISTORY OF PRESENT ILLNESS: Rita Wright is a 69 y.o. female who presents to the clinic today for:   HPI    Retina Follow Up    Patient presents with  Other.  In both eyes.  This started 1 year ago.  Severity is mild.  Duration of 1 year.  Since onset it is stable.          Comments    1 YR FU, B SCAN FP OD    Pt reports stable vision, no f/f, no pain or pressure.        Last edited by Rita Wright D on 01/30/2020  8:11 AM. (History)      Referring physician: Carlena Hurl, PA-C [place],  [place] [id number]  HISTORICAL INFORMATION:   Selected notes from the [facility]: No current outpatient medications on file. (Ophthalmic Drugs)   No current facility-administered medications for this visit. (Ophthalmic Drugs)   Current Outpatient Medications (Other)  Medication Sig  . cholecalciferol (VITAMIN D) 400 UNITS TABS Take 400 Units by mouth.  . folic acid (FOLVITE) 1 MG tablet Take 1 tablet (1 mg total) by mouth daily.  Marland Kitchen levothyroxine (SYNTHROID) 100 MCG tablet TAKE 1 TABLET BY MOUTH EVERY DAY BEFORE BREAKFAST  . metoprolol tartrate (LOPRESSOR) 50 MG tablet TAKE 1 TABLET BY MOUTH TWICE A DAY  . Rivaroxaban (XARELTO) 15 MG TABS tablet Take 1 tablet (15 mg total) by mouth once for 1 dose.  . rosuvastatin (CRESTOR) 5 MG tablet Take 1 tablet (5 mg total) by mouth daily. (Patient not taking: Reported on 09/20/2019)   No current facility-administered medications for this visit. (Other)      REVIEW OF SYSTEMS:    ALLERGIES Allergies  Allergen Reactions  . Valsartan Cough  . Boniva [Ibandronic Acid]     Horrible joint pains  . Erythromycin     GI upset  . Prednisone Other (See Comments)    Jittery, insomnia, intolerance  . Penicillins Hives, Rash and Other (See Comments)    Felt like she was going to pass out.    PAST  MEDICAL HISTORY Past Medical History:  Diagnosis Date  . Colon cancer screening 09/2014   Cologuard test negative   . Depression   . Dilated cardiomyopathy ([place]) 05/2014   Dr. Woody Seller  . Echocardiogram abnormal 05/2014   mild dilation of Left Ventricle; Dr. Woody Seller  . Hashimoto's thyroiditis   . History of mammogram 10/10/14   normal  . Multinodular goiter   . Noncompliance with diagnostic testing    due to severe anxiety  . Osteoporosis   . Panic disorder    severe, doesn't drive, doesn't use stairs or elevators, severely limited by her anxiety  . Renal insufficiency   . Thyroid nodule 04/04/92   benign follicular nodule on biopsy   Past Surgical History:  Procedure Laterality Date  . COLONOSCOPY  2014   cologuard 2016; refuses colonoscopy  . TUBAL LIGATION  1986    FAMILY HISTORY Family History  Problem Relation Age of Onset  . Hypertension Mother   . Parkinson's disease Father   . Heart failure Sister   . Hypertension Brother   . Cerebral aneurysm Sister   . Throat cancer Brother     SOCIAL HISTORY Social History   Tobacco Use  . Smoking status: Former Smoker  Packs/day: 1.50    Years: 35.00    Pack years: 52.50    Types: Cigarettes    Quit date: 02/23/1992    Years since quitting: 27.9  . Smokeless tobacco: Never Used  Vaping Use  . Vaping Use: Never used  Substance Use Topics  . Alcohol use: No  . Drug use: No         OPHTHALMIC EXAM:  Base Eye Exam    Visual Acuity (ETDRS)      Right Left   Dist cc 20/20 -2 20/20 -1   Correction: Glasses       Tonometry (Tonopen, 8:15 AM)      Right Left   Pressure 12 17       Pupils      Pupils Dark Light Shape React APD   Right PERRL 4 3 Round Minimal None   Left PERRL 3 2 Round Minimal None       Visual Fields (Counting fingers)      Left Right    Full Full       Extraocular Movement      Right Left    Full Full       Neuro/Psych    Oriented x3: Yes       Dilation    Both eyes: 1.0%  Mydriacyl, 2.5% Phenylephrine @ 8:16 AM        Slit Lamp and Fundus Exam    External Exam      Right Left   External Normal Normal       Slit Lamp Exam      Right Left   Lids/Lashes Normal Normal   Conjunctiva/Sclera White and quiet White and quiet   Cornea Clear Clear   Anterior Chamber Deep and quiet Deep and quiet   Iris Round and reactive Round and reactive   Lens Nuclear sclerosis Nuclear sclerosis   Anterior Vitreous Normal Normal       Fundus Exam      Right Left   Posterior Vitreous Normal Normal   Disc Normal Normal   C/D Ratio 0.35 0.35   Macula Normal, no exudates, no macular thickening, no hemorrhage Normal, no exudates, no macular thickening, no hemorrhage   Vessels Normal Normal   Periphery Choroidal nevus, nasal to nerve, juxtapapillary, with broadest dimension being approximately six disc diameters horizontally and approximately five and half vertically, no adjacent subretinal fluid, no rim of atrophy, no obvious vascularity, no lipofusci, , , n, white pigmentary change Each of which is stable Normal          IMAGING AND PROCEDURES  Imaging and Procedures for 01/30/20  B-Scan Ultrasound - OD - Right Eye       Quality was good.   Notes Choroidal nevus nasal to nerve, no adjacent subretinal fluid, homogeneous reflectivity, no obvious vascular lakes       Color Fundus Photography Optos - OU - Both Eyes       Right Eye Progression has been stable.   Left Eye Progression has been stable.   Notes Pigmented choroidal nevus with no irregular pigmentation, dome has no  Lipofuscin, and stable white drusenoid appearance over the dome.  There is no adjacent subretinal fluid there is no obvious vascularity there is no ring of atrophy.                ASSESSMENT/PLAN:  Nuclear sclerotic cataract of both eyes Mild and not visually significant  Choroidal nevus of right eye Risk factors for malignant  transformation of a choroidal nevus: [   ]thickness greater than 3 mm,  [  ]subretinal fluid,  [  ]symptoms,  [  ]orange pigment present,  [  ]margin within 3 mm of the optic disc,  [  ]ultrasonographic hollowness (versus solid/flat), medium internal reflectivity on scan [  ]absence of halo (A halo refers to a pigmented choroidal nevus surrounded by a circular band of depigmentation) [  ]absence of drusen [ x]size larger than 85mm basal diameter (over 3 dd size) [  ]observed growth  AJO: 202, June 2019, 100-108  Location juxtapapillary is of concern but not a risk factor for transformation  The nature of choroidal nevus was discussed with the patient and an informational form was offered.  Photo documentation was discussed with the patient.  Periodic follow-up may be needed for a lifetime. The patient's questions were answered. At minimum, annual exams will be needed if no signs of early growth.The nature of choroidal nevus was discussed with the patient and an informational form was offered.  Photo documentation was discussed with the patient.  Periodic follow-up may be needed for a lifetime. The patient's questions were answered. At minimum, annual exams will be needed if no signs of early growth.  Patient does have wellness checks including blood work.  I asked if she had any recent blood work she says she has.  She reported that there are some "liver changes" on the blood test.  I have asked the patient follow-up with the instructions given her by her doctor, to have a "liver scan"      ICD-10-CM   1. Choroidal nevus of right eye  D31.31 B-Scan Ultrasound - OD - Right Eye    Color Fundus Photography Optos - OU - Both Eyes  2. Nuclear sclerotic cataract of both eyes  H25.13     1.  Patient to follow-up with PCP regarding potential liver scan as recent blood test "slightly abnormal liver values.  Verify explained the patient that although the process of choroidal nevus in the right eye is of medium size and with no signs of  growth over the last 8 to 10 years, that its location is of concern.  She has almost no risk factors for transformation of nevus.  Nonetheless today the liver function tests do suggest need for liver scan.  2.  Patient indicates her understanding.  We will asked patient follow-up in 3 months to again reassess this condition with color fundus photography and B-scan ultrasonography.  3.  Ophthalmic Meds Ordered this visit:  No orders of the defined types were placed in this encounter.      Return in about 3 months (around 04/29/2020) for dilate, OD, COLOR FP, B-Scan U/S.  There are no Patient Instructions on file for this visit.   Explained the diagnoses, plan, and follow up with the patient and they expressed understanding.  Patient expressed understanding of the importance of proper follow up care.   Clent Demark Kentrail Shew M.D. Diseases & Surgery of the Retina and Vitreous [facility] [place] 01/30/20     Abbreviations: M myopia (nearsighted); A astigmatism; H hyperopia (farsighted); P presbyopia; Mrx spectacle prescription;  CTL contact lenses; OD right eye; OS left eye; OU both eyes  XT exotropia; ET esotropia; PEK punctate epithelial keratitis; PEE punctate epithelial erosions; DES dry eye syndrome; MGD meibomian gland dysfunction; ATs artificial tears; PFAT's preservative free artificial tears; [place] nuclear sclerotic cataract; PSC posterior subcapsular cataract; ERM epi-retinal membrane; PVD posterior vitreous  detachment; RD retinal detachment; DM diabetes mellitus; DR diabetic retinopathy; NPDR non-proliferative diabetic retinopathy; PDR proliferative diabetic retinopathy; CSME clinically significant macular edema; DME diabetic macular edema; dbh dot blot hemorrhages; CWS cotton wool spot; POAG primary open angle glaucoma; C/D cup-to-disc ratio; HVF humphrey visual field; GVF goldmann visual field; OCT optical coherence tomography; IOP intraocular pressure; BRVO Branch retinal vein  occlusion; CRVO central retinal vein occlusion; CRAO central retinal artery occlusion; BRAO branch retinal artery occlusion; RT retinal tear; SB scleral buckle; PPV pars plana vitrectomy; VH Vitreous hemorrhage; PRP panretinal laser photocoagulation; IVK intravitreal kenalog; VMT vitreomacular traction; MH Macular hole;  NVD neovascularization of the disc; NVE neovascularization elsewhere; AREDS age related eye disease study; ARMD age related macular degeneration; POAG primary open angle glaucoma; EBMD epithelial/anterior basement membrane dystrophy; ACIOL anterior chamber intraocular lens; IOL intraocular lens; PCIOL posterior chamber intraocular lens; Phaco/IOL phacoemulsification with intraocular lens placement; [place] photorefractive keratectomy; LASIK laser assisted in situ keratomileusis; HTN hypertension; DM diabetes mellitus; COPD chronic obstructive pulmonary disease

## 2020-01-30 NOTE — Assessment & Plan Note (Signed)
Mild and not visually significant

## 2020-01-30 NOTE — Assessment & Plan Note (Addendum)
Risk factors for malignant transformation of a choroidal nevus: [  ]thickness greater than 3 mm,  [  ]subretinal fluid,  [  ]symptoms,  [  ]orange pigment present,  [  ]margin within 3 mm of the optic disc,  [  ]ultrasonographic hollowness (versus solid/flat), medium internal reflectivity on scan [  ]absence of halo (A halo refers to a pigmented choroidal nevus surrounded by a circular band of depigmentation) [  ]absence of drusen [ x]size larger than 2mm basal diameter (over 3 dd size) [  ]observed growth  AJO: 202, June 2019, 100-108  Location juxtapapillary is of concern but not a risk factor for transformation  The nature of choroidal nevus was discussed with the patient and an informational form was offered.  Photo documentation was discussed with the patient.  Periodic follow-up may be needed for a lifetime. The patient's questions were answered. At minimum, annual exams will be needed if no signs of early growth.The nature of choroidal nevus was discussed with the patient and an informational form was offered.  Photo documentation was discussed with the patient.  Periodic follow-up may be needed for a lifetime. The patient's questions were answered. At minimum, annual exams will be needed if no signs of early growth.  Patient does have wellness checks including blood work.  I asked if she had any recent blood work she says she has.  She reported that there are some "liver changes" on the blood test.  I have asked the patient follow-up with the instructions given her by her doctor, to have a "liver scan"

## 2020-02-27 ENCOUNTER — Other Ambulatory Visit: Payer: Self-pay

## 2020-02-27 DIAGNOSIS — I4891 Unspecified atrial fibrillation: Secondary | ICD-10-CM

## 2020-02-27 MED ORDER — RIVAROXABAN 15 MG PO TABS: 15.0000 mg | ORAL_TABLET | Freq: Once | ORAL | 3 refills | Status: DC

## 2020-03-07 ENCOUNTER — Other Ambulatory Visit: Payer: Self-pay

## 2020-03-07 ENCOUNTER — Telehealth (INDEPENDENT_AMBULATORY_CARE_PROVIDER_SITE_OTHER): Payer: Medicare Other | Admitting: Medical

## 2020-03-07 ENCOUNTER — Other Ambulatory Visit (INDEPENDENT_AMBULATORY_CARE_PROVIDER_SITE_OTHER): Payer: Medicare Other

## 2020-03-07 ENCOUNTER — Encounter: Payer: Self-pay | Admitting: Medical

## 2020-03-07 VITALS — Temp 98.8°F | Ht 67.0 in | Wt 142.0 lb

## 2020-03-07 DIAGNOSIS — R52 Pain, unspecified: Secondary | ICD-10-CM

## 2020-03-07 DIAGNOSIS — R0981 Nasal congestion: Secondary | ICD-10-CM

## 2020-03-07 DIAGNOSIS — R059 Cough, unspecified: Secondary | ICD-10-CM | POA: Diagnosis not present

## 2020-03-07 LAB — POC COVID19 BINAXNOW: SARS Coronavirus 2 Ag: NEGATIVE

## 2020-03-07 NOTE — Progress Notes (Signed)
Subjective:     Patient ID: Rita Wright, female   DOB: 1951/02/18, 70 y.o.   MRN: [id number]  This visit type was conducted due to national recommendations for restrictions regarding the COVID-19 Pandemic (e.g. social distancing) in an effort to limit this patient's exposure and mitigate transmission in our community.  Due to their co-morbid illnesses, this patient is at least at moderate risk for complications without adequate follow up.  This format is felt to be most appropriate for this patient at this time.    Documentation for virtual audio and video telecommunications through [place] encounter:  The patient was located at home. The provider was located in the office. The patient did consent to this visit and is aware of possible charges through their insurance for this visit.  The other persons participating in this telemedicine service were none. Time spent on call was 20 minutes and in review of previous records 20 minutes total.  This virtual service is not related to other E/M service within previous 7 days.   HPI Chief Complaint  Patient presents with  . URI    Fever, runny nose, bodyache, chills, muscle pain, sore throat. Symptoms started yesterday morning. Husband has been sick but no results back yet    Virtual consult for illness.  Using tylenol every 6 hours.   Aches all over.  Symptoms began yesterday.   She notes sore throat, aches all over, fever on an off, low grade, fatigue . Was in bed early last night due to feeling back.  Temp 100.6  Has had some loose stool once.  No nausea, no vomiting.  Sinuses feel swollen but no head congestion or headache.  No SOB or wheezing.   Hydration is ok.    She has been around her husband who was sick all this past week with suspected COVID.  They think his COVID test got lost as it has been over 7 days and he still have a result  She would like to get a test.  No other aggravating or relieving factors. No other  complaint.  Past Medical History:  Diagnosis Date  . Colon cancer screening 09/2014   Cologuard test negative   . Depression   . Dilated cardiomyopathy ([place]) 05/2014   Dr. Woody Seller  . Echocardiogram abnormal 05/2014   mild dilation of Left Ventricle; Dr. Woody Seller  . Hashimoto's thyroiditis   . History of mammogram 10/10/14   normal  . Multinodular goiter   . Noncompliance with diagnostic testing    due to severe anxiety  . Osteoporosis   . Panic disorder    severe, doesn't drive, doesn't use stairs or elevators, severely limited by her anxiety  . Renal insufficiency   . Thyroid nodule 08/22/14   benign follicular nodule on biopsy     Review of Systems As in subjective    Objective:   Physical Exam Due to coronavirus pandemic stay at home measures, patient visit was virtual and they were not examined in person.   Temp 98.8 F (37.1 C)   Ht 5\' 7"  (1.702 m)   Wt 142 lb (64.4 kg)   LMP 02/23/1992   BMI 22.24 kg/m   Gen: wd, wn,nad, somewhat ill appearing Pleasant, answers questions in complete sentences No obvious shortness of breath or wheezing       Assessment:     Encounter Diagnoses  Name Primary?  . Cough Yes  . Body aches   . Head congestion  Plan:     She has symptoms x1 day.  She has had the COVID-vaccine  Her risk factors include age, heart disease, kidney disease.  She will come in today for COVID screening.  In the meantime we discussed the following recommendations   General recommendations if you have respiratory symptoms: We recommend you rest, hydrate well with water and clear fluids throughout the day such as water, soup broth, ice chips, or possibly pedialyte or G2 no sugar gatorade.   You can use Tylenol over the counter for pain or fever every 4 - 6 hours You can use over the counter Delsym or mucinex DM for cough unless your provider prescribed a cough medication already. You can use over the counter Emetrol over the counter for nausea.     Consider EmergenC Immune plus vitamin pack over the counter which contains extra vitamin C, vitamin D, and zinc.  If you are having trouble breathing, if you are very weak, have high fever 103 or higher consistently despite Tylenol, or uncontrollable nausea and vomiting, then call or go to the emergency department.    Covid symptoms such as fatigue and cough can linger over 2 weeks, even after the initial fever, aches, chills, and other initial symptoms.   Self Quarantine and Isolation: Log onto [employer] for up to date quarantine recommendations.   [email or web address]   If you test Covid +, regardless of vaccination status:  Stay home for 5 days. If you have no symptoms or your symptoms are resolving after 5 days, then you can leave your house. Continue to wear a mask around others for 5 additional days. If you have a fever, continue to stay home until your fever resolves.  This could take 7-10 days from onset of symptoms or longer in some cases. If you have lots of coughing, sneezing, and significant runny nose and congestion, continue to stay at home until your symptoms are resolving   If you were exposed to someone with Covid: If you: Have been boosted OR Completed the primary series of Pfizer or Moderna vaccine within the last 6 months OR Completed the primary series of J&J vaccine within the last 2 months  Wear a mask around others for 10 days.  Test on day 5, if possible.   If you test positive on day 5 or more after exposure, and no symptoms, then wear a mask around others for 10 days  If you test positive and have symptoms, then follow the positive covid result isolation recommendations above If you test negative and have no symptoms on day 5 after exposure, then you may end isolation and be around others    If you were exposed to someone with Covid: If you: Completed the primary series of Pfizer or  Moderna vaccine over 6 months ago and are not boosted OR Completed the primary series of J&J over 2 months ago and are not boosted OR Are unvaccinated  Stay home for 5 days. After that continue to wear a mask around others for 5 additional days. If you can't quarantine you must wear a mask for 10 days. Test on day 5 if possible. If you test positive on day 5 or more after exposure, and no symptoms, then wear a mask around others for 10 days  If you test positive and have symptoms, then follow the positive covid result isolation recommendations above If you test negative and have no symptoms on day 5 after exposure, then you can end isolation but wear  a mask around others for 5 more days   If you test Covid negative, but have respiratory symptoms:  Continue to wear a mask around others for 5 additional days. If you have a fever, continue to stay home until your fever resolves.   If you have lots of coughing, sneezing, and significant runny nose and congestion, continue to stay at home until your symptoms are resolving   Isolation means avoiding contact with people as much as possible.   Particularly in your house, isolate your self from others in a separate room, wear a mask when possible in the room, particularly if coughing a lot.   Have others bring food, water, medications, etc., to your door, but avoid direct contact with your household contacts during this time to avoid spreading the infection to them.   If you have a separate bathroom and living quarters during the next 2 weeks away from others, that would be preferable.    If you can't completely isolate, then wear a mask, wash hands frequently with soap and water for at least 15 seconds, minimize close contact with others, and have a friend or family member check regularly from a distance to make sure you are not getting seriously worse.     You should not be going out in public, should not be going to stores, to work or other public  places until all your symptoms have resolved.  One of the goals is to limit spread to high risk people; people that are older and elderly, people with multiple health issues like diabetes, heart disease, lung disease, and anybody that has weakened immune systems such as people with cancer or on immunosuppressive therapy.  Kioni was seen today for uri.  Diagnoses and all orders for this visit:  Cough  Body aches  Head congestion  Follow-up in our back parking lot today for Covid testing

## 2020-03-10 LAB — NOVEL CORONAVIRUS, NAA: SARS-CoV-2, NAA: NOT DETECTED

## 2020-03-11 ENCOUNTER — Telehealth: Payer: Self-pay | Admitting: Medical

## 2020-03-11 ENCOUNTER — Other Ambulatory Visit: Payer: Self-pay | Admitting: Medical

## 2020-03-11 MED ORDER — DOXYCYCLINE MONOHYDRATE 100 MG PO TABS: 100.0000 mg | ORAL_TABLET | Freq: Two times a day (BID) | ORAL | 0 refills | Status: DC

## 2020-03-11 NOTE — Telephone Encounter (Signed)
I sent doxycycline antibiotic to the pharmacy. Continue rest, hydration, Tylenol for pain or fever or body aches. Continue extra vitamins such as vitamin D, zinc and vitamin C such as in [employer] immune plus OTC If having trouble breathing or tight in the chest I can send an inhaler Consider nasal saline flush if not doing this Hydrate, hydrate, hydrate!

## 2020-03-11 NOTE — Telephone Encounter (Signed)
Message has been sent to patient via mychart  

## 2020-03-11 NOTE — Telephone Encounter (Signed)
Pt called and said she is not getting any better. She said she has bloody mucus and headaches and swollen eyes now and feels like it may be a sinus infection

## 2020-03-13 ENCOUNTER — Other Ambulatory Visit: Payer: Medicare Other

## 2020-03-20 ENCOUNTER — Other Ambulatory Visit: Payer: Self-pay

## 2020-03-20 ENCOUNTER — Ambulatory Visit: Payer: Medicare Other | Admitting: Cardiology

## 2020-03-20 ENCOUNTER — Ambulatory Visit: Payer: Medicare Other

## 2020-03-20 DIAGNOSIS — I5022 Chronic systolic (congestive) heart failure: Secondary | ICD-10-CM

## 2020-03-20 DIAGNOSIS — I428 Other cardiomyopathies: Secondary | ICD-10-CM | POA: Diagnosis not present

## 2020-04-02 ENCOUNTER — Ambulatory Visit: Payer: Medicare Other | Admitting: Cardiology

## 2020-04-02 ENCOUNTER — Other Ambulatory Visit: Payer: Self-pay

## 2020-04-02 ENCOUNTER — Encounter: Payer: Self-pay | Admitting: Cardiology

## 2020-04-02 VITALS — BP 103/68 | HR 77 | Temp 97.3°F | Resp 16 | Ht 67.0 in | Wt 144.0 lb

## 2020-04-02 DIAGNOSIS — I739 Peripheral vascular disease, unspecified: Secondary | ICD-10-CM | POA: Diagnosis not present

## 2020-04-02 DIAGNOSIS — I4821 Permanent atrial fibrillation: Secondary | ICD-10-CM

## 2020-04-02 DIAGNOSIS — E7800 Pure hypercholesterolemia, unspecified: Secondary | ICD-10-CM | POA: Diagnosis not present

## 2020-04-02 DIAGNOSIS — N1831 Chronic kidney disease, stage 3a: Secondary | ICD-10-CM | POA: Diagnosis not present

## 2020-04-02 DIAGNOSIS — I5022 Chronic systolic (congestive) heart failure: Secondary | ICD-10-CM

## 2020-04-02 MED ORDER — VALSARTAN 80 MG PO TABS: 80.0000 mg | ORAL_TABLET | Freq: Every evening | ORAL | 1 refills | Status: DC

## 2020-04-02 NOTE — Progress Notes (Signed)
Primary Physician:  Carlena Hurl, PA-C   Patient ID: Rita Wright, female    DOB: [birth year], 70 y.o.   MRN: [id number]  Subjective:    Chief Complaint  Patient presents with  . Cardiomyopathy  . Atrial Fibrillation  . Follow-up    6 month    HPI: Rita Wright  is a 70 y.o. 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   By nuclear stress test in 2019, felt to be nonischemic.  Patient did not want cardioversion or atrial fibrillation ablation evaluation.  She is also very averse to starting any medications.  This is a 28-month office visit.  Denies any chest pain, dyspnea, continues to exercise regularly.  Tolerating anticoagulation without bleeding diathesis.  Past Medical History:  Diagnosis Date  . Colon cancer screening 09/2014   Cologuard test negative   . Depression   . Dilated cardiomyopathy ([place]) 05/2014   Dr. Woody Seller  . Echocardiogram abnormal 05/2014   mild dilation of Left Ventricle; Dr. Woody Seller  . Hashimoto's thyroiditis   . History of mammogram 10/10/14   normal  . Multinodular goiter   . Noncompliance with diagnostic testing    due to severe anxiety  . Osteoporosis   . Panic disorder    severe, doesn't drive, doesn't use stairs or elevators, severely limited by her anxiety  . Renal insufficiency   . Thyroid nodule 1/75/10   benign follicular nodule on biopsy    Past Surgical History:  Procedure Laterality Date  . COLONOSCOPY  2014   cologuard 2016; refuses colonoscopy  . TUBAL LIGATION  1986   Social History   Tobacco Use  . Smoking status: Former Smoker    Packs/day: 1.50    Years: 35.00    Pack years: 52.50    Types: Cigarettes    Quit date: 02/23/1992    Years since quitting: 28.1  . Smokeless tobacco: Never Used  Substance Use Topics  . Alcohol use: No   Marital Status: Married   Review of Systems  Cardiovascular:  Negative for chest pain, dyspnea on exertion and leg swelling.  Gastrointestinal: Negative for melena.  Psychiatric/Behavioral: Positive for depression. The patient is nervous/anxious.    Objective:  Blood pressure 103/68, pulse 77, temperature (!) 97.3 F (36.3 C), temperature source Temporal, resp. rate 16, height 5\' 7"  (1.702 m), weight 144 lb (65.3 kg), last menstrual period 02/23/1992, SpO2 99 %. Body mass index is 22.55 kg/m.   Vitals with BMI 04/02/2020 03/07/2020 12/10/2019  Height 5\' 7"  5\' 7"  5\' 7"   Weight 144 lbs 142 lbs 142 lbs  BMI 22.55 25.85 27.78  Systolic 242 - 353  Diastolic 68 - 70  Pulse 77 - 88       Physical Exam Vitals reviewed.  Constitutional:      Appearance: She is well-developed.  Cardiovascular:     Rate and Rhythm: Normal rate. Rhythm irregularly irregular.     Pulses:          Carotid pulses are 2+ on the right side and 2+ on the left side.      Femoral pulses are 2+ on the right side and 2+ on the left side.      Popliteal pulses are 2+ on the right side and 2+ on the left side.       Dorsalis pedis pulses are 1+ on the right side and 1+ on the left side.  Posterior tibial pulses are 1+ on the right side and 1+ on the left side.     Heart sounds: Normal heart sounds.     Comments: Acrocyanosis bilaterally. No pedal edema. No JVD. Pulmonary:     Effort: Pulmonary effort is normal. No accessory muscle usage or respiratory distress.     Breath sounds: Normal breath sounds.  Abdominal:     General: Bowel sounds are normal.     Palpations: Abdomen is soft.  Musculoskeletal:        General: Normal range of motion.     Radiology: No results found.  Laboratory examination:    CMP Latest Ref Rng & Units 11/07/2019 09/18/2019 12/07/2018  Glucose 65 - 99 mg/dL 110(H) 100(H) -  BUN 8 - 27 mg/dL 48(H) 41(H) -  Creatinine 0.57 - 1.00 mg/dL 1.06(H) 0.99 -  Sodium 134 - 144 mmol/L 139 140 -  Potassium 3.5 - 5.2 mmol/L 4.7 4.5 -  Chloride 96 - 106  mmol/L 101 100 -  CO2 20 - 29 mmol/L 28 24 -  Calcium 8.7 - 10.3 mg/dL 9.1 9.7 -  Total Protein 6.0 - 8.5 g/dL - 6.9 6.8  Total Bilirubin 0.0 - 1.2 mg/dL - 0.7 0.7  Alkaline Phos 48 - 121 IU/L - 109 105  AST 0 - 40 IU/L - 35 30  ALT 0 - 32 IU/L - 50(H) 42(H)   CBC Latest Ref Rng & Units 12/10/2019 09/18/2019 12/07/2018  WBC 3.4 - 10.8 x10E3/uL 5.5 5.6 6.4  Hemoglobin 11.1 - 15.9 g/dL 16.1(H) 16.4(H) 16.3(H)  Hematocrit 34.0 - 46.6 % 48.9(H) 49.7(H) 50.9(H)  Platelets 150 - 450 x10E3/uL 168 170 173   Lipid Panel     Component Value Date/Time   CHOL 228 (H) 09/18/2019 0818   TRIG 71 09/18/2019 0818   HDL 81 09/18/2019 0818   CHOLHDL 2.7 12/07/2018 1025   CHOLHDL 2.5 09/01/2016 0818   VLDL 12 09/01/2016 0818   LDLCALC 135 (H) 09/18/2019 0818   HEMOGLOBIN A1C No results found for: HGBA1C, MPG TSH Recent Labs    12/10/19 0939  TSH 5.200*   PRN Meds:. Medications Discontinued During This Encounter  Medication Reason  . rosuvastatin (CRESTOR) 5 MG tablet Error  . doxycycline (ADOXA) 100 MG tablet Error   Current Meds  Medication Sig  . cholecalciferol (VITAMIN D) 400 UNITS TABS Take 400 Units by mouth.  . folic acid (FOLVITE) 1 MG tablet Take 1 tablet (1 mg total) by mouth daily.  Marland Kitchen levothyroxine (SYNTHROID) 100 MCG tablet TAKE 1 TABLET BY MOUTH EVERY DAY BEFORE BREAKFAST  . metoprolol tartrate (LOPRESSOR) 50 MG tablet TAKE 1 TABLET BY MOUTH TWICE A DAY  . Rivaroxaban (XARELTO) 15 MG TABS tablet Take 1 tablet (15 mg total) by mouth once for 1 dose.  . valsartan (DIOVAN) 80 MG tablet Take 1 tablet (80 mg total) by mouth every evening.   Cardiac Studies:   Lexiscan myoview stress test 09/05/2017: 1. Lexiscan stress test was performed. Exercise capacity was not assessed. Stress symptoms included headache, "panic like symptoms". Peak blood pressure was 162/102 mmHg. The resting and stress electrocardiogram demonstrated atrial fibrillation with rapid ventricular response, RBBB  + LAHB, no resting arrhythmias and normal rest repolarization. Stress EKG is non diagnostic for ischemia as it is a pharmacologic stress. 2. The overall quality of the study is good. Left ventricular cavity is noted to be normal on the rest and stress studies. Gated SPECT images reveal global reduction in normal myocardial thickening  and wall motion. The left ventricular ejection fraction was calculated or visually estimated to be 26%. LVEF could be underestimated due to gating difficulties during Afib w/RVR. Review of the raw data in a rotational cine format reveals breast attenuation with imaging performed in sitting position. REST and STRESS images demonstrate small sized area of mildly decreased tracer uptake in the basal inferoseptal, basal inferior, mid inferoseptal and mid inferior segments of the left ventricle with no reversibility. Findings most likely represent breast attenuation artifact. 3. High risk study due to reduced LVEF. Recommend echocardiogram, preferably with controlled ventricular rate, for accurate LVEF estimation.  Lower Extremity Arterial Duplex 11/03/2018:  No hemodynamically significant stenoses are identified in the  lower extremity arterial system.  This exam reveals moderately decreased perfusion of the right lower extremity, noted at the post tibial artery level (ABI 0.75). This exam reveals moderately decreased perfusion of the left lower extremity, noted at the dorsalis pedis artery level (ABI 0.75).  Study suggests diffuse disease.  Echocardiogram 03/20/2020: Mildly depressed LV systolic function with visual EF 45-50%. Left ventricle cavity is normal in size. Normal global wall motion. Unable to evaluate diastolic function due to underlying rhythm appears to be atrial flutter.  Normal LAP.  Left atrial cavity is mildly dilated. Mild (Grade I) aortic regurgitation. Mild (Grade I) mitral regurgitation. Mild tricuspid regurgitation. No evidence of pulmonary  hypertension. Compared to prior study dated 06/27/2019: LVEF has improved from 20-25% to 45-50%, moderate MR and TR improved to mild AR, MR, TR.  EKG:   EKG 04/02/2020: Atrial fibrillation with controlled ventricular response at the rate of 83 bpm, left axis deviation, left anterior fascicular block.  Incomplete right bundle branch block.  Poor R progression, cannot exclude anteroseptal infarct old.  Nonspecific T abnormality.   No significant change from EKG 7/29/202  Assessment:     ICD-10-CM   1. Permanent atrial fibrillation ([place]). CHA2DS2-VASc Score is 5.  Yearly risk of stroke: 6.7% (A, F, HTN, PAD, CHF).      I48.21 EKG 12-Lead    CBC    CBC  2. Chronic systolic heart failure (HCC)  I50.22 valsartan (DIOVAN) 80 MG tablet  3. PAD (peripheral artery disease) (HCC)  I73.9   4. Stage 3a chronic kidney disease (HCC)  Y85.02 Basic metabolic panel    Basic metabolic panel  5. Hypercholesteremia  E78.00    Meds ordered this encounter  Medications  . valsartan (DIOVAN) 80 MG tablet    Sig: Take 1 tablet (80 mg total) by mouth every evening.    Dispense:  30 tablet    Refill:  1    Medications Discontinued During This Encounter  Medication Reason  . rosuvastatin (CRESTOR) 5 MG tablet Error  . doxycycline (ADOXA) 100 MG tablet Error    Orders Placed This Encounter  Procedures  . CBC    Standing Status:   Future    Number of Occurrences:   1    Standing Expiration Date:   04/02/2021  . Basic metabolic panel    Standing Status:   Future    Number of Occurrences:   1    Standing Expiration Date:   04/02/2021  . EKG 12-Lead    Recommendations:   Rita Wright  is a 70 y.o. Caucasian female patient with hashimoto's thyroiditis, osteoporosis, former tobacco use, non-ischemic cardiomyopathy with severe LV systolic dysfunction and permanent atrial fibrillation. Per her wishes , she did not have cardioversion, ablation, or try antiarrhythmic therapy. She also did not  want to try heart  failure medications. She is on rate control therapy with Metoprolol 50mg  BID and Xarelto for anticoagulation that she is tolerating well.   On her last office visit had started on Diovan but she called stating that she had some cough. I discontinued this. Today she tells me that she is not sure whether the cough was caused by Diovan and she is willing to retry the medication. Advised her to start with 40 mg of the 80 mg Rx that she still has at home and if he tolerates this with no cough, to call us back and I'll do 90-day supplies. Patient is extremely careful with any medications.  She does have hyperlipidemia but does not want to be on a statin. She does have mild chronic renal insufficiency as well. Hence I'll obtain repeat CBC and a BMP both for therapeutic drug monitoring as she is on anticoagulants and also in case she starts valsartan would like to monitor her renal function. Otherwise stable from cardiac standpoint I'll see her back in 1 year or sooner if problems.    Adrian Prows, MD, [facility] 04/02/2020, 11:23 AM Office: [phone]

## 2020-04-29 ENCOUNTER — Encounter (INDEPENDENT_AMBULATORY_CARE_PROVIDER_SITE_OTHER): Payer: Medicare Other | Admitting: Ophthalmology

## 2020-04-29 DIAGNOSIS — I4821 Permanent atrial fibrillation: Secondary | ICD-10-CM | POA: Diagnosis not present

## 2020-04-29 DIAGNOSIS — N1831 Chronic kidney disease, stage 3a: Secondary | ICD-10-CM | POA: Diagnosis not present

## 2020-04-30 ENCOUNTER — Other Ambulatory Visit: Payer: Self-pay | Admitting: Cardiology

## 2020-04-30 DIAGNOSIS — I5022 Chronic systolic (congestive) heart failure: Secondary | ICD-10-CM

## 2020-04-30 DIAGNOSIS — I4891 Unspecified atrial fibrillation: Secondary | ICD-10-CM

## 2020-04-30 LAB — CBC
Hematocrit: 47.2 % — ABNORMAL HIGH (ref 34.0–46.6)
Hemoglobin: 15.4 g/dL (ref 11.1–15.9)
MCH: 31.5 pg (ref 26.6–33.0)
MCHC: 32.6 g/dL (ref 31.5–35.7)
MCV: 97 fL (ref 79–97)
Platelets: 158 10*3/uL (ref 150–450)
RBC: 4.89 x10E6/uL (ref 3.77–5.28)
RDW: 11.9 % (ref 11.7–15.4)
WBC: 5.5 10*3/uL (ref 3.4–10.8)

## 2020-04-30 LAB — BASIC METABOLIC PANEL
BUN/Creatinine Ratio: 41 — ABNORMAL HIGH (ref 12–28)
BUN: 46 mg/dL — ABNORMAL HIGH (ref 8–27)
CO2: 25 mmol/L (ref 20–29)
Calcium: 9.3 mg/dL (ref 8.7–10.3)
Chloride: 102 mmol/L (ref 96–106)
Creatinine, Ser: 1.11 mg/dL — ABNORMAL HIGH (ref 0.57–1.00)
Glucose: 109 mg/dL — ABNORMAL HIGH (ref 65–99)
Potassium: 4.6 mmol/L (ref 3.5–5.2)
Sodium: 142 mmol/L (ref 134–144)
eGFR: 53 mL/min/{1.73_m2} — ABNORMAL LOW (ref >59)

## 2020-04-30 MED ORDER — VALSARTAN 80 MG PO TABS: 80.0000 mg | ORAL_TABLET | Freq: Every evening | ORAL | 3 refills | Status: DC

## 2020-05-23 NOTE — Telephone Encounter (Signed)
From patient.

## 2020-12-01 ENCOUNTER — Encounter: Payer: Self-pay | Admitting: Internal Medicine

## 2020-12-10 ENCOUNTER — Encounter: Payer: Self-pay | Admitting: Medical

## 2020-12-10 ENCOUNTER — Telehealth: Payer: Self-pay | Admitting: Medical

## 2020-12-10 ENCOUNTER — Other Ambulatory Visit: Payer: Self-pay

## 2020-12-10 ENCOUNTER — Ambulatory Visit (INDEPENDENT_AMBULATORY_CARE_PROVIDER_SITE_OTHER): Payer: Medicare Other | Admitting: Medical

## 2020-12-10 VITALS — BP 128/82 | HR 87 | Ht 65.0 in | Wt 151.4 lb

## 2020-12-10 DIAGNOSIS — Z87891 Personal history of nicotine dependence: Secondary | ICD-10-CM | POA: Diagnosis not present

## 2020-12-10 DIAGNOSIS — I4891 Unspecified atrial fibrillation: Secondary | ICD-10-CM

## 2020-12-10 DIAGNOSIS — I420 Dilated cardiomyopathy: Secondary | ICD-10-CM

## 2020-12-10 DIAGNOSIS — K219 Gastro-esophageal reflux disease without esophagitis: Secondary | ICD-10-CM

## 2020-12-10 DIAGNOSIS — E039 Hypothyroidism, unspecified: Secondary | ICD-10-CM | POA: Diagnosis not present

## 2020-12-10 DIAGNOSIS — R748 Abnormal levels of other serum enzymes: Secondary | ICD-10-CM

## 2020-12-10 DIAGNOSIS — N289 Disorder of kidney and ureter, unspecified: Secondary | ICD-10-CM | POA: Diagnosis not present

## 2020-12-10 DIAGNOSIS — Z79899 Other long term (current) drug therapy: Secondary | ICD-10-CM | POA: Diagnosis not present

## 2020-12-10 DIAGNOSIS — Z91199 Patient's noncompliance with other medical treatment and regimen due to unspecified reason: Secondary | ICD-10-CM

## 2020-12-10 DIAGNOSIS — T50Z95D Adverse effect of other vaccines and biological substances, subsequent encounter: Secondary | ICD-10-CM | POA: Diagnosis not present

## 2020-12-10 DIAGNOSIS — Z13220 Encounter for screening for lipoid disorders: Secondary | ICD-10-CM

## 2020-12-10 DIAGNOSIS — Z7189 Other specified counseling: Secondary | ICD-10-CM

## 2020-12-10 DIAGNOSIS — Z1231 Encounter for screening mammogram for malignant neoplasm of breast: Secondary | ICD-10-CM

## 2020-12-10 DIAGNOSIS — E2839 Other primary ovarian failure: Secondary | ICD-10-CM

## 2020-12-10 DIAGNOSIS — F411 Generalized anxiety disorder: Secondary | ICD-10-CM

## 2020-12-10 DIAGNOSIS — R7301 Impaired fasting glucose: Secondary | ICD-10-CM | POA: Diagnosis not present

## 2020-12-10 DIAGNOSIS — Z780 Asymptomatic menopausal state: Secondary | ICD-10-CM

## 2020-12-10 NOTE — Progress Notes (Addendum)
Subjective:    Rita Wright is a 70 y.o. female who presents for Preventative Services visit and chronic medical problems/med check visit.    Primary Care Provider Tysinger, Camelia Eng, PA-C here for primary care  Current Health Care Team: Dentist, Dentures Eye doctor, Dr. Jabier Mutton and Dr. Zadie Rhine Dr. Adrian Prows, cardiology Dr. Carol Ada, [place] you may have received from other than [facility] providers in the past year (date may be approximate) Cardiology  Exercise Current exercise habits: The patient does not participate in regular exercise at present.   Nutrition/Diet Current diet: in general, a "healthy" diet    Depression Screen Depression screen PHQ 2/9 12/10/2020  Decreased Interest 0  Down, Depressed, Hopeless 0  PHQ - 2 Score 0  Altered sleeping -  Tired, decreased energy -  Change in appetite -  Feeling bad or failure about yourself  -  Trouble concentrating -  Moving slowly or fidgety/restless -  Suicidal thoughts -  PHQ-9 Score -  Difficult doing work/chores -    Activities of Daily Living Screen/Functional Status Survey Is the patient deaf or have difficulty hearing?: No Does the patient have difficulty seeing, even when wearing glasses/contacts?: No Does the patient have difficulty concentrating, remembering, or making decisions?: Yes Does the patient have difficulty walking or climbing stairs?: No Does the patient have difficulty dressing or bathing?: No Does the patient have difficulty doing errands alone such as visiting a doctor's office or shopping?: No  Can patient draw a clock face showing 3:15 oclock, yes  Fall Risk Screen Fall Risk  12/10/2020 12/10/2019 12/07/2018 12/05/2017 09/19/2017  Falls in the past year? 0 1 0 No No  Comment - - - - -  Number falls in past yr: 0 0 - - -  Injury with Fall? 0 1 - - -  Comment - bruise on right arm - - -  Risk for fall due to : No Fall Risks No Fall Risks - - -  Follow up Falls evaluation completed  Falls evaluation completed - - -    Gait Assessment: Normal gait observed yes  Advanced directives Does patient have a [place]? No Does patient have a Living Will? No  Past Medical History:  Diagnosis Date   Colon cancer screening 09/2014   Cologuard test negative    Depression    Dilated cardiomyopathy ([place]) 05/2014   Dr. Woody Seller   Echocardiogram abnormal 05/2014   mild dilation of Left Ventricle; Dr. Woody Seller   Hashimoto's thyroiditis    History of mammogram 10/10/14   normal   Multinodular goiter    Noncompliance with diagnostic testing    due to severe anxiety   Osteoporosis    Panic disorder    severe, doesn't drive, doesn't use stairs or elevators, severely limited by her anxiety   Renal insufficiency    Thyroid nodule 0/17/79   benign follicular nodule on biopsy    Past Surgical History:  Procedure Laterality Date   COLONOSCOPY  2014   cologuard 2016; refuses colonoscopy   TUBAL LIGATION  1986    Social History   Socioeconomic History   Marital status: Married    Spouse name: Not on file   Number of children: 2   Years of education: Not on file   Highest education level: Not on file  Occupational History   Not on file  Tobacco Use   Smoking status: Former    Packs/day: 1.50    Years: 35.00  Pack years: 52.50    Types: Cigarettes    Quit date: 02/23/1992    Years since quitting: 28.8   Smokeless tobacco: Never  Vaping Use   Vaping Use: Never used  Substance and Sexual Activity   Alcohol use: No   Drug use: No   Sexual activity: Not Currently  Other Topics Concern   Not on file  Social History Narrative   Married.  Exercise with walking her dog, gardening.      Social Determinants of Health   Financial Resource Strain: Not on file  Food Insecurity: Not on file  Transportation Needs: Not on file  Physical Activity: Not on file  Stress: Not on file  Social Connections: Not on file  Intimate Partner Violence: Not on file     Family History  Problem Relation Age of Onset   Hypertension Mother    Parkinson's disease Father    Heart failure Sister    Hypertension Brother    Cerebral aneurysm Sister    Throat cancer Brother      Current Outpatient Medications:    cholecalciferol (VITAMIN D) 400 UNITS TABS, Take 400 Units by mouth., Disp: , Rfl:    folic acid (FOLVITE) 1 MG tablet, Take 1 tablet (1 mg total) by mouth daily., Disp: 90 tablet, Rfl: 3   levothyroxine (SYNTHROID) 100 MCG tablet, TAKE 1 TABLET BY MOUTH EVERY DAY BEFORE BREAKFAST, Disp: 90 tablet, Rfl: 3   metoprolol tartrate (LOPRESSOR) 50 MG tablet, TAKE 1 TABLET BY MOUTH TWICE A DAY, Disp: 180 tablet, Rfl: 1   Rivaroxaban (XARELTO) 15 MG TABS tablet, Take 1 tablet (15 mg total) by mouth once for 1 dose., Disp: 90 tablet, Rfl: 3   valsartan (DIOVAN) 80 MG tablet, Take 1 tablet (80 mg total) by mouth every evening. (Patient not taking: Reported on 12/10/2020), Disp: 90 tablet, Rfl: 3  Allergies  Allergen Reactions   Valsartan Cough   Boniva [Ibandronic Acid]     Horrible joint pains   Erythromycin     GI upset   Prednisone Other (See Comments)    Jittery, insomnia, intolerance   Penicillins Hives, Rash and Other (See Comments)    Felt like she was going to pass out.    History reviewed: allergies, current medications, past family history, past medical history, past social history, past surgical history and problem list  Chronic issues discussed: She notes compliance with medicaiton.  +Cologuard - she is aware of this and refuses to go back to GI.  Saw GI  Dr. Benson Norway 2019 after the + Cologuard.   Acute issues discussed: Vaccine reaction concerns from this past year.  Has questions about vaccines.   Objective:      Biometrics BP 128/82 (BP Location: Right Arm, Patient Position: Sitting)   Pulse 87   Ht 5\' 5"  (1.651 m)   Wt 151 lb 6.4 oz (68.7 kg)   LMP 02/23/1992   SpO2 97%   BMI 25.19 kg/m    Gen: wd, wn, nad Neck:  supple, no lymphadenopathy, no thyromegaly, no masses, no bruits Heart: irregularly irregular, otherwise otherwise RR, normal S1, S2, no murmurs Lungs: CTA bilaterally, no wheezes, rhonchi, or rales Abdomen: +bs, soft, non tender, non distended, no masses, no hepatomegaly, no splenomegaly Musculoskeletal: nontender, no swelling, no obvious deformity Extremities: no edema, no cyanosis, no clubbing Pulses: 2+ symmetric, upper and lower extremities, normal cap refill Neurological: alert, oriented x 3, CN2-12 intact, strength normal upper extremities and lower extremities, sensation normal throughout, DTRs  2+ throughout, no cerebellar signs, gait normal Psychiatric: normal affect, behavior normal, pleasant     Assessment:   Encounter Diagnoses  Name Primary?   Renal insufficiency Yes   Hypothyroidism, unspecified type    Dilated cardiomyopathy (HCC)    Atrial fibrillation, unspecified type ([place])    Generalized anxiety disorder    Former smoker    Gastroesophageal reflux disease without esophagitis    Impaired fasting blood sugar    High risk medication use    Screening for lipid disorders    Vaccine reaction, subsequent encounter    Abnormal levels of other serum enzymes     Post-menopausal    Estrogen deficiency    Encounter for screening mammogram for malignant neoplasm of breast    Noncompliance    Advanced directives, counseling/discussion      Plan:   This visit was a preventative care visit, also known as wellness visit or routine physical.   Topics typically include healthy lifestyle, diet, exercise, preventative care, vaccinations, sick and well care, proper use of emergency dept and after hours care, as well as other concerns.     Recommendations: Continue to return yearly for your annual wellness and preventative care visits.  This gives [place] a chance to discuss healthy lifestyle, exercise, vaccinations, review your chart record, and perform screenings where  appropriate.  I recommend you see your eye doctor yearly for routine vision care.  I recommend you see your dentist yearly for routine dental care including hygiene visits twice yearly.   Vaccination recommendations were reviewed Immunization History  Administered Date(s) Administered   Fluad Quad(high Dose 65+) 12/10/2019   Influenza Split 12/28/2010   Influenza Whole 11/14/2009   Influenza, High Dose Seasonal PF 12/05/2017   Influenza, Seasonal, Injecte, Preservative Fre 03/03/2012   Influenza,inj,Quad PF,6+ Mos 11/17/2012, 11/22/2013   Influenza-Unspecified 12/02/2015   PFIZER(Purple Top)SARS-COV-2 Vaccination 05/13/2019, 05/28/2019   Pneumococcal Conjugate-13 09/01/2016   Pneumococcal Polysaccharide-23 12/05/2017   Tdap 08/01/2008    Referral to allergist given vaccine reactions 2022  You are due for updated tetanus and shingles vaccines   Screening for cancer: Colon cancer screening: You declined recheck with Dr. Benson Norway although you had + Cologuard test in 2019.   I recommend you go back to gastroenterology for further evaluation  Breast cancer screening: You should perform a self breast exam monthly.   We reviewed recommendations for regular mammograms and breast cancer screening.  Cervical cancer screening: No longer recommended at this age.   Skin cancer screening: Check your skin regularly for new changes, growing lesions, or other lesions of concern Come in for evaluation if you have skin lesions of concern.  Lung cancer screening: If you have a greater than 20 pack year history of tobacco use, then you may qualify for lung cancer screening with a chest CT scan.   Please call your insurance company to inquire about coverage for this test.  We currently don't have screenings for other cancers besides breast, cervical, colon, and lung cancers.  If you have a strong family history of cancer or have other cancer screening concerns, please let me know.    Bone  health: Get at least 150 minutes of aerobic exercise weekly Get weight bearing exercise at least once weekly Bone density test:  A bone density test is an imaging test that uses a type of X-ray to measure the amount of calcium and other minerals in your bones. The test may be used to diagnose or screen you for a condition  that causes weak or thin bones (osteoporosis), predict your risk for a broken bone (fracture), or determine how well your osteoporosis treatment is working. The bone density test is recommended for females 49 and older, or females or males <29 if certain risk factors such as thyroid disease, long term use of steroids such as for asthma or rheumatological issues, vitamin D deficiency, estrogen deficiency, family history of osteoporosis, self or family history of fragility fracture in first degree relative.   I recommend an updated bone density test   Heart health: Get at least 150 minutes of aerobic exercise weekly Limit alcohol It is important to maintain a healthy blood pressure and healthy cholesterol numbers  Heart disease screening: Screening for heart disease includes screening for blood pressure, fasting lipids, glucose/diabetes screening, BMI height to weight ratio, reviewed of smoking status, physical activity, and diet.    Goals include blood pressure 120/80 or less, maintaining a healthy lipid/cholesterol profile, preventing diabetes or keeping diabetes numbers under good control, not smoking or using tobacco products, exercising most days per week or at least 150 minutes per week of exercise, and eating healthy variety of fruits and vegetables, healthy oils, and avoiding unhealthy food choices like fried food, fast food, high sugar and high cholesterol foods.     Continue routine follow up with cardiology   Medical care options: I recommend you continue to seek care here first for routine care.  We try really hard to have available appointments Monday through  Friday daytime hours for sick visits, acute visits, and physicals.  Urgent care should be used for after hours and weekends for significant issues that cannot wait till the next day.  The emergency department should be used for significant potentially life-threatening emergencies.  The emergency department is expensive, can often have long wait times for less significant concerns, so try to utilize primary care, urgent care, or telemedicine when possible to avoid unnecessary trips to the emergency department.  Virtual visits and telemedicine have been introduced since the pandemic started in 2020, and can be convenient ways to receive medical care.  We offer virtual appointments as well to assist you in a variety of options to seek medical care.   Advanced Directives: I recommend you consider completing a [place] and Living Will.   These documents respect your wishes and help alleviate burdens on your loved ones if you were to become terminally ill or be in a position to need those documents enforced.    You can complete Advanced Directives yourself, have them notarized, then have copies made for our office, for you and for anybody you feel should have them in safe keeping.  Or, you can have an attorney prepare these documents.   If you haven't updated your Last Will and Testament in a while, it may be worthwhile having an attorney prepare these documents together and save on some costs.        Significant separate issues: Hypothyroidism-continue current medication  Atrial fibrillation, dilated cardiomyopathy-follow-up with cardiology  Renal insufficiency-updated labs today  Impaired glucose, prediabetes Prediabetes means you have a higher than normal blood sugar level. It's not high enough to be considered type 2 diabetes yet, but without making some lifestyle changes you are more likely to develop type 2 diabetes.  If you have prediabetes, the long-term damage of diabetes,  especially to your heart, blood vessels and kidneys may already be starting. You may not be able to change certain risk factors such as  age, race, or family history, but you CAN make changes to your lifestyle, your eating habits, and your activity.  Although diabetes can develop at any age, the risk of prediabetes increases after age 49.  Your risk of prediabetes increases if you have a parent or sibling with type 2 diabetes.   Although it's unclear why, certain people including Black, Hispanic, American [place] American people, are more likely to develop prediabetes.  Ways to prevent or slow progression to diabetes: Eat healthy foods - Eating red meat and processed meat, and drinking sugar-sweetened beverages, is associated with a higher risk of prediabetes. A diet high in fruits, vegetables, nuts, whole grains and olive oil is associated with a lower risk of prediabetes. Get at least 150 minutes of moderate aerobic physical activity a week, or about 30 minutes on most days of the week.  The less active you are, the greater your risk of prediabetes. Physical activity helps you control your weight, uses up sugar for energy and makes the body use insulin more effectively. Lose excess weight - Being overweight is a primary risk factor for prediabetes. The more fatty tissue you have, especially inside and between the muscle and skin around your abdomen, the more resistant your cells become to insulin. Waist size. A large waist size can indicate insulin resistance. The risk of insulin resistance goes up for men with waists larger than 40 inches and for women with waists larger than 35 inches. Control your blood pressure and cholesterol.  If your blood pressure is not 130/80 or less, discuss with your provider to help get this under control.  It is ideal to have an HDL good cholesterol number >50 and have a LDL bad cholesterol number <100.   Don't smoke One simple strategy to help you make good food  choices and eat appropriate portions sizes is to divide up your plate. These three divisions on your plate promote healthy eating:  One-half: fruit and nonstarchy vegetables One-quarter: whole grains One-quarter: protein-rich foods, such as legumes, fish or lean meats   Abnormal colon cancer screening-strongly recommend you follow-up with Dr. Benson Norway   Noncompliance  Due to severe phobias of heights, she will not go to a facility that is not 1 story.  Thus, she has not gotten mammogram or bone density since these locations here in [place] are not 1 story.    She also refuses to follow up on abnormal cologard.   We discussed this again today  She had reaction to flu and covid vaccines last year with rash and swollen arm.   Advised allergist consult but she declines today    Violetta was seen today for [employer].  Diagnoses and all orders for this visit:  Renal insufficiency -     Comprehensive metabolic panel  Hypothyroidism, unspecified type -     Thyroid Panel With TSH  Dilated cardiomyopathy ([place]) -     Lipid panel  Atrial fibrillation, unspecified type (HCC)  Generalized anxiety disorder  Former smoker  Gastroesophageal reflux disease without esophagitis  Impaired fasting blood sugar -     Hemoglobin A1c  High risk medication use -     Comprehensive metabolic panel -     CBC  Screening for lipid disorders -     Lipid panel  Vaccine reaction, subsequent encounter  Abnormal levels of other serum enzymes  -     Lipid panel  Post-menopausal -     DG Bone Density; Future  Estrogen deficiency -  DG Bone Density; Future  Encounter for screening mammogram for malignant neoplasm of breast -     MM DIGITAL SCREENING BILATERAL; Future  Noncompliance  Advanced directives, counseling/discussion      Spent > 45 minutes face to face with patient in discussion of symptoms, evaluation, plan and recommendations.     Medicare Attestation A  preventative services visit was completed today.  During the course of the visit the patient was educated and counseled about appropriate screening and preventive services.  A health risk assessment was established with the patient that included a review of current medications, allergies, social history, family history, medical and preventative health history, biometrics, and preventative screenings to identify potential safety concerns or impairments.  A personalized plan was printed today for the patient's records and use.   Personalized health advice and education was given today to reduce health risks and promote self management and wellness.  Information regarding end of life planning was discussed today.  Dorothea Ogle, PA-C   12/10/2020

## 2020-12-10 NOTE — Patient Instructions (Signed)
This visit was a preventative care visit, also known as wellness visit or routine physical.   Topics typically include healthy lifestyle, diet, exercise, preventative care, vaccinations, sick and well care, proper use of emergency dept and after hours care, as well as other concerns.     Recommendations: Continue to return yearly for your annual wellness and preventative care visits.  This gives [place] a chance to discuss healthy lifestyle, exercise, vaccinations, review your chart record, and perform screenings where appropriate.  I recommend you see your eye doctor yearly for routine vision care.  I recommend you see your dentist yearly for routine dental care including hygiene visits twice yearly.   Vaccination recommendations were reviewed Immunization History  Administered Date(s) Administered   Fluad Quad(high Dose 65+) 12/10/2019   Influenza Split 12/28/2010   Influenza Whole 11/14/2009   Influenza, High Dose Seasonal PF 12/05/2017   Influenza, Seasonal, Injecte, Preservative Fre 03/03/2012   Influenza,inj,Quad PF,6+ Mos 11/17/2012, 11/22/2013   Influenza-Unspecified 12/02/2015   PFIZER(Purple Top)SARS-COV-2 Vaccination 05/13/2019, 05/28/2019   Pneumococcal Conjugate-13 09/01/2016   Pneumococcal Polysaccharide-23 12/05/2017   Tdap 08/01/2008    Referral to allergist given vaccine reactions 2022  You are due for updated tetanus and shingles vaccines   Screening for cancer: Colon cancer screening: You declined recheck with Dr. Benson Norway although you had + Cologuard test in 2019.   I recommend you go back to gastroenterology for further evaluation  Breast cancer screening: You should perform a self breast exam monthly.   We reviewed recommendations for regular mammograms and breast cancer screening.  Cervical cancer screening: No longer recommended at this age.   Skin cancer screening: Check your skin regularly for new changes, growing lesions, or other lesions of concern Come  in for evaluation if you have skin lesions of concern.  Lung cancer screening: If you have a greater than 20 pack year history of tobacco use, then you may qualify for lung cancer screening with a chest CT scan.   Please call your insurance company to inquire about coverage for this test.  We currently don't have screenings for other cancers besides breast, cervical, colon, and lung cancers.  If you have a strong family history of cancer or have other cancer screening concerns, please let me know.    Bone health: Get at least 150 minutes of aerobic exercise weekly Get weight bearing exercise at least once weekly Bone density test:  A bone density test is an imaging test that uses a type of X-ray to measure the amount of calcium and other minerals in your bones. The test may be used to diagnose or screen you for a condition that causes weak or thin bones (osteoporosis), predict your risk for a broken bone (fracture), or determine how well your osteoporosis treatment is working. The bone density test is recommended for females 66 and older, or females or males <07 if certain risk factors such as thyroid disease, long term use of steroids such as for asthma or rheumatological issues, vitamin D deficiency, estrogen deficiency, family history of osteoporosis, self or family history of fragility fracture in first degree relative.   I recommend an updated bone density test   Heart health: Get at least 150 minutes of aerobic exercise weekly Limit alcohol It is important to maintain a healthy blood pressure and healthy cholesterol numbers  Heart disease screening: Screening for heart disease includes screening for blood pressure, fasting lipids, glucose/diabetes screening, BMI height to weight ratio, reviewed of smoking status, physical activity, and  diet.    Goals include blood pressure 120/80 or less, maintaining a healthy lipid/cholesterol profile, preventing diabetes or keeping diabetes  numbers under good control, not smoking or using tobacco products, exercising most days per week or at least 150 minutes per week of exercise, and eating healthy variety of fruits and vegetables, healthy oils, and avoiding unhealthy food choices like fried food, fast food, high sugar and high cholesterol foods.     Continue routine follow up with cardiology   Medical care options: I recommend you continue to seek care here first for routine care.  We try really hard to have available appointments Monday through Friday daytime hours for sick visits, acute visits, and physicals.  Urgent care should be used for after hours and weekends for significant issues that cannot wait till the next day.  The emergency department should be used for significant potentially life-threatening emergencies.  The emergency department is expensive, can often have long wait times for less significant concerns, so try to utilize primary care, urgent care, or telemedicine when possible to avoid unnecessary trips to the emergency department.  Virtual visits and telemedicine have been introduced since the pandemic started in 2020, and can be convenient ways to receive medical care.  We offer virtual appointments as well to assist you in a variety of options to seek medical care.   Advanced Directives: I recommend you consider completing a [place] and Living Will.   These documents respect your wishes and help alleviate burdens on your loved ones if you were to become terminally ill or be in a position to need those documents enforced.    You can complete Advanced Directives yourself, have them notarized, then have copies made for our office, for you and for anybody you feel should have them in safe keeping.  Or, you can have an attorney prepare these documents.   If you haven't updated your Last Will and Testament in a while, it may be worthwhile having an attorney prepare these documents together and save  on some costs.        Significant separate issues: Hypothyroidism-continue current medication  Atrial fibrillation, dilated cardiomyopathy-follow-up with cardiology  Renal insufficiency-updated labs today  Impaired glucose, prediabetes Prediabetes means you have a higher than normal blood sugar level. It's not high enough to be considered type 2 diabetes yet, but without making some lifestyle changes you are more likely to develop type 2 diabetes.  If you have prediabetes, the long-term damage of diabetes, especially to your heart, blood vessels and kidneys may already be starting. You may not be able to change certain risk factors such as age, race, or family history, but you CAN make changes to your lifestyle, your eating habits, and your activity.  Although diabetes can develop at any age, the risk of prediabetes increases after age 35.  Your risk of prediabetes increases if you have a parent or sibling with type 2 diabetes.   Although it's unclear why, certain people including Black, Hispanic, American [place] American people, are more likely to develop prediabetes.  Ways to prevent or slow progression to diabetes: Eat healthy foods - Eating red meat and processed meat, and drinking sugar-sweetened beverages, is associated with a higher risk of prediabetes. A diet high in fruits, vegetables, nuts, whole grains and olive oil is associated with a lower risk of prediabetes. Get at least 150 minutes of moderate aerobic physical activity a week, or about 30 minutes on most days of  the week.  The less active you are, the greater your risk of prediabetes. Physical activity helps you control your weight, uses up sugar for energy and makes the body use insulin more effectively. Lose excess weight - Being overweight is a primary risk factor for prediabetes. The more fatty tissue you have, especially inside and between the muscle and skin around your abdomen, the more resistant your cells become  to insulin. Waist size. A large waist size can indicate insulin resistance. The risk of insulin resistance goes up for men with waists larger than 40 inches and for women with waists larger than 35 inches. Control your blood pressure and cholesterol.  If your blood pressure is not 130/80 or less, discuss with your provider to help get this under control.  It is ideal to have an HDL good cholesterol number >50 and have a LDL bad cholesterol number <100.   Don't smoke One simple strategy to help you make good food choices and eat appropriate portions sizes is to divide up your plate. These three divisions on your plate promote healthy eating:  One-half: fruit and nonstarchy vegetables One-quarter: whole grains One-quarter: protein-rich foods, such as legumes, fish or lean meats   Abnormal colon cancer screening-strongly recommend you follow-up with Dr. Benson Norway

## 2020-12-10 NOTE — Telephone Encounter (Signed)
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  She will not use escalator or elevator.  This has prevented her from getting mammogram and bone density  Last year I asked her to check around to see if anybody had availability for the scans on a first floor.  I do not think she has done this.  However I just thought about this, we now have a mobile bus that drives around to different areas and does mammogram and bone density in the bus.  I would like to get her scheduled for mobile mammogram and bone density test.  Lets get her scheduled for this.  I am not sure how to get her on the schedule for the bus.

## 2020-12-11 ENCOUNTER — Other Ambulatory Visit: Payer: Self-pay | Admitting: Medical

## 2020-12-11 DIAGNOSIS — E039 Hypothyroidism, unspecified: Secondary | ICD-10-CM

## 2020-12-11 DIAGNOSIS — E2839 Other primary ovarian failure: Secondary | ICD-10-CM

## 2020-12-11 DIAGNOSIS — R7989 Other specified abnormal findings of blood chemistry: Secondary | ICD-10-CM

## 2020-12-11 DIAGNOSIS — I4891 Unspecified atrial fibrillation: Secondary | ICD-10-CM

## 2020-12-11 LAB — COMPREHENSIVE METABOLIC PANEL
ALT: 55 IU/L — ABNORMAL HIGH (ref 0–32)
AST: 40 IU/L (ref 0–40)
Albumin/Globulin Ratio: 1.7 (ref 1.2–2.2)
Albumin: 4.3 g/dL (ref 3.7–4.7)
Alkaline Phosphatase: 96 IU/L (ref 44–121)
BUN/Creatinine Ratio: 48 — ABNORMAL HIGH (ref 12–28)
BUN: 42 mg/dL — ABNORMAL HIGH (ref 8–27)
Bilirubin Total: 0.8 mg/dL (ref 0.0–1.2)
CO2: 23 mmol/L (ref 20–29)
Calcium: 9.8 mg/dL (ref 8.7–10.3)
Chloride: 102 mmol/L (ref 96–106)
Creatinine, Ser: 0.88 mg/dL (ref 0.57–1.00)
Globulin, Total: 2.6 g/dL (ref 1.5–4.5)
Glucose: 99 mg/dL (ref 70–99)
Potassium: 4.6 mmol/L (ref 3.5–5.2)
Sodium: 141 mmol/L (ref 134–144)
Total Protein: 6.9 g/dL (ref 6.0–8.5)
eGFR: 70 mL/min/{1.73_m2} (ref >59)

## 2020-12-11 LAB — LIPID PANEL
Chol/HDL Ratio: 2.6 ratio (ref 0.0–4.4)
Cholesterol, Total: 255 mg/dL — ABNORMAL HIGH (ref 100–199)
HDL: 97 mg/dL (ref >39)
LDL Chol Calc (NIH): 145 mg/dL — ABNORMAL HIGH (ref 0–99)
Triglycerides: 77 mg/dL (ref 0–149)
VLDL Cholesterol Cal: 13 mg/dL (ref 5–40)

## 2020-12-11 LAB — CBC
Hematocrit: 50.4 % — ABNORMAL HIGH (ref 34.0–46.6)
Hemoglobin: 16.6 g/dL — ABNORMAL HIGH (ref 11.1–15.9)
MCH: 30.9 pg (ref 26.6–33.0)
MCHC: 32.9 g/dL (ref 31.5–35.7)
MCV: 94 fL (ref 79–97)
Platelets: 151 10*3/uL (ref 150–450)
RBC: 5.38 x10E6/uL — ABNORMAL HIGH (ref 3.77–5.28)
RDW: 12.2 % (ref 11.7–15.4)
WBC: 6.7 10*3/uL (ref 3.4–10.8)

## 2020-12-11 LAB — HEMOGLOBIN A1C
Est. average glucose Bld gHb Est-mCnc: 117 mg/dL
Hgb A1c MFr Bld: 5.7 % — ABNORMAL HIGH (ref 4.8–5.6)

## 2020-12-11 LAB — THYROID PANEL WITH TSH
Free Thyroxine Index: 2 (ref 1.2–4.9)
T3 Uptake Ratio: 28 % (ref 24–39)
T4, Total: 7.2 ug/dL (ref 4.5–12.0)
TSH: 5.09 u[IU]/mL — ABNORMAL HIGH (ref 0.450–4.500)

## 2020-12-11 MED ORDER — RIVAROXABAN 15 MG PO TABS: 15.0000 mg | ORAL_TABLET | Freq: Once | ORAL | 0 refills | Status: DC

## 2020-12-11 MED ORDER — METOPROLOL TARTRATE 50 MG PO TABS: 50.0000 mg | ORAL_TABLET | Freq: Two times a day (BID) | ORAL | 0 refills | Status: DC

## 2020-12-11 MED ORDER — FOLIC ACID 1 MG PO TABS: 1.0000 mg | ORAL_TABLET | Freq: Every day | ORAL | 3 refills | Status: DC

## 2020-12-11 MED ORDER — ROSUVASTATIN CALCIUM 10 MG PO TABS
10.0000 mg | ORAL_TABLET | Freq: Every day | ORAL | 3 refills | Status: DC
Start: 2020-12-11 — End: 2021-12-11

## 2020-12-11 MED ORDER — LEVOTHYROXINE SODIUM 100 MCG PO TABS: ORAL_TABLET | ORAL | 3 refills | Status: DC

## 2020-12-11 NOTE — Telephone Encounter (Signed)
Pt notified and she is unsure of which location she would like to do so she is going to call the breast center @ [phone] and she will let me know how this goes via my chart message or phone call

## 2020-12-16 ENCOUNTER — Ambulatory Visit
Admission: RE | Admit: 2020-12-16 | Discharge: 2020-12-16 | Disposition: A | Discharge status: Home / Self Care | Payer: Medicare Other | Source: Ambulatory Visit | Attending: Medical | Admitting: Medical

## 2020-12-16 DIAGNOSIS — R7989 Other specified abnormal findings of blood chemistry: Secondary | ICD-10-CM | POA: Diagnosis not present

## 2020-12-18 ENCOUNTER — Other Ambulatory Visit: Payer: Self-pay | Admitting: Internal Medicine

## 2020-12-18 DIAGNOSIS — R7989 Other specified abnormal findings of blood chemistry: Secondary | ICD-10-CM

## 2020-12-22 ENCOUNTER — Other Ambulatory Visit: Payer: Self-pay | Admitting: Internal Medicine

## 2020-12-22 DIAGNOSIS — T50Z95A Adverse effect of other vaccines and biological substances, initial encounter: Secondary | ICD-10-CM

## 2020-12-31 ENCOUNTER — Encounter: Payer: Self-pay | Admitting: Internal Medicine

## 2020-12-31 ENCOUNTER — Ambulatory Visit (INDEPENDENT_AMBULATORY_CARE_PROVIDER_SITE_OTHER): Payer: Medicare Other | Admitting: Internal Medicine

## 2020-12-31 ENCOUNTER — Other Ambulatory Visit: Payer: Self-pay

## 2020-12-31 VITALS — BP 120/78 | HR 75 | Temp 97.3°F | Resp 16 | Ht 65.0 in | Wt 151.2 lb

## 2020-12-31 DIAGNOSIS — T50B95A Adverse effect of other viral vaccines, initial encounter: Secondary | ICD-10-CM | POA: Diagnosis not present

## 2020-12-31 DIAGNOSIS — J3089 Other allergic rhinitis: Secondary | ICD-10-CM

## 2020-12-31 DIAGNOSIS — J310 Chronic rhinitis: Secondary | ICD-10-CM

## 2020-12-31 DIAGNOSIS — J302 Other seasonal allergic rhinitis: Secondary | ICD-10-CM | POA: Diagnosis not present

## 2020-12-31 DIAGNOSIS — Z880 Allergy status to penicillin: Secondary | ICD-10-CM

## 2020-12-31 DIAGNOSIS — K9049 Malabsorption due to intolerance, not elsewhere classified: Secondary | ICD-10-CM | POA: Diagnosis not present

## 2020-12-31 NOTE — Progress Notes (Signed)
NEW PATIENT Date of Service/Encounter:  12/31/20 Referring provider: Carlena Hurl, PA-C Primary care provider: Carlena Hurl, PA-C  Subjective:  Rita Wright is a 70 y.o. female with a PMHx of chronic systolic heart failure, permanent atrial fibrillation presenting today for evaluation of reaction to COVID vaccine. History obtained from: chart review and patient and husband .  Vaccine reactions:  April 2021-within 3 days, she developed itching at site of reaction, then developed localized swelling and hives 2. In October 2021-got flu vaccine, the next morning developed hives and redness and itchiness at site of injection and lasted for several days She denies any other systemic reaction with either of these reactions. She does have significant anxiety in general, but is interested in getting her yearly flu and covid vaccines because she is around her 4 small grandchildren on a regular basis and she herself has a heart condition.  She is worried about complications from either of these viruses as well as getting the virus themselves.  She is open to getting vaccine in a monitored setting (such as our clinic).  Red meat intolerance: She also is concerned about red meat intolerance.  She has never been a "big" red meat eater.  She started eating red meat more frequently and noticed a lot of gas and bloating.  This improved after stopping eating red meat.  She has been bitten my a tick in the past.  Chronic rhinitis: constant runny nose, like a faucet.  Spring and Fall worse seasons-thinks molds maybe affect he  Other allergy screening: Asthma: no Food allergy: no Medication allergy:  penicillins-years ago was last time she took it, felt like she was going to pass out, has had hives before Eczema:no   Past Medical History: Past Medical History:  Diagnosis Date   Colon cancer screening 09/2014   Cologuard test negative    Depression    Dilated cardiomyopathy ([place]) 05/2014    Dr. Woody Seller   Echocardiogram abnormal 05/2014   mild dilation of Left Ventricle; Dr. Woody Seller   Hashimoto's thyroiditis    History of mammogram 10/10/14   normal   Multinodular goiter    Noncompliance with diagnostic testing    due to severe anxiety   Osteoporosis    Panic disorder    severe, doesn't drive, doesn't use stairs or elevators, severely limited by her anxiety   Renal insufficiency    Thyroid nodule 6/64/40   benign follicular nodule on biopsy   Medication List:  Current Outpatient Medications  Medication Sig Dispense Refill   cholecalciferol (VITAMIN D) 400 UNITS TABS Take 400 Units by mouth.     folic acid (FOLVITE) 1 MG tablet Take 1 tablet (1 mg total) by mouth daily. 90 tablet 3   levothyroxine (SYNTHROID) 100 MCG tablet TAKE 1 TABLET BY MOUTH EVERY DAY BEFORE BREAKFAST 90 tablet 3   metoprolol tartrate (LOPRESSOR) 50 MG tablet Take 1 tablet (50 mg total) by mouth 2 (two) times daily. 180 tablet 0   Rivaroxaban (XARELTO) 15 MG TABS tablet Take 1 tablet (15 mg total) by mouth once for 1 dose. 90 tablet 0   rosuvastatin (CRESTOR) 10 MG tablet Take 1 tablet (10 mg total) by mouth daily. (Patient not taking: Reported on 12/31/2020) 90 tablet 3   No current facility-administered medications for this visit.   Known Allergies:  Allergies  Allergen Reactions   Valsartan Cough   Boniva [Ibandronic Acid]     Horrible joint pains   Erythromycin  GI upset   Prednisone Other (See Comments)    Jittery, insomnia, intolerance   Penicillins Hives, Rash and Other (See Comments)    Felt like she was going to pass out.   Past Surgical History: Past Surgical History:  Procedure Laterality Date   COLONOSCOPY  2014   cologuard 2016; refuses colonoscopy   TUBAL LIGATION  1986   Family History: Family History  Problem Relation Age of Onset   Hypertension Mother    Parkinson's disease Father    Heart failure Sister    Hypertension Brother    Cerebral aneurysm Sister    Throat  cancer Brother    Social History: [place] lives in a house built 70 years ago, no water damage, + carpet, gas heating, central AC, 2 dogs, 2 cats, no roaches, DM protection on mattress but not pillows, no smoke exposure, quit smoking in 1994.   ROS:  All other systems negative except as noted per HPI.  Objective:  Blood pressure 120/78, pulse 75, temperature (!) 97.3 F (36.3 C), temperature source Temporal, resp. rate 16, height 5\' 5"  (1.651 m), weight 151 lb 4 oz (68.6 kg), last menstrual period 02/23/1992, SpO2 97 %. Body mass index is 25.17 kg/m. Physical Exam:  General Appearance:  Alert, cooperative, no distress, appears stated age  Head:  Normocephalic, without obvious abnormality, atraumatic  Eyes:  Conjunctiva clear, EOM's intact  Nose: Nares normal, pink turbinates  Throat: Lips, tongue normal; teeth and gums normal, normal posterior oropharynx  Neck: Supple, symmetrical  Lungs:   CTAB, Respirations unlabored, no coughing  Heart:  RRR, no murmur, Appears well perfused  Extremities: No edema  Skin: Skin color, texture, turgor normal, no rashes or lesions on visualized portions of skin  Neurologic: No gross deficits   Diagnostics:  Skin Testing: Environmental allergy panel. Adequate positive and negative controls. Results discussed with patient/family.  Airborne Adult Perc - 12/31/20 1500     Time Antigen Placed 1545    Allergen Manufacturer Lavella Hammock    Location Back    Number of Test 59    Panel 1 Select    1. Control-Buffer 50% Glycerol Negative    2. Control-Histamine 1 mg/ml 3+    3. Albumin saline Negative    4. Toco Negative    5. [place] Negative    6. Johnson Negative    7. [place] 4+    8. Meadow Fescue 4+    9. Perennial Rye 4+    10. Sweet Vernal 2+    11. Timothy 4+    12. Cocklebur 2+    13. Burweed Marshelder 2+    14. Ragweed, short Negative    15. Ragweed, Giant Negative    16. Plantain,  English Negative    17. Lamb's Quarters 2+     18. Sheep Sorrell Negative    19. Rough Pigweed Negative    20. Marsh Elder, Rough Negative    21. Mugwort, Common Negative    22. Ash mix Negative    23. Birch mix Negative    24. Beech American Negative    25. Box, Elder 2+    26. Cedar, red Negative    27. Cottonwood, Eastern 2+    28. Elm mix Negative    29. Hickory Negative    30. Maple mix Negative    31. Oak, [place] mix Negative    32. Pecan Pollen Negative    33. Pine mix Negative    34. Sycamore Eastern Negative  [place], Black Pollen Negative    36. Alternaria alternata Negative    37. Cladosporium Herbarum Negative    38. Aspergillus mix Negative    39. Penicillium mix Negative    40. Bipolaris sorokiniana (Helminthosporium) Negative    41. Drechslera spicifera (Curvularia) 3+    42. Mucor plumbeus 3+    43. Fusarium moniliforme Negative    44. Aureobasidium pullulans (pullulara) Negative    45. Rhizopus oryzae Negative    46. Botrytis cinera Negative    47. Epicoccum nigrum Negative    48. Phoma betae Negative    49. Candida Albicans Negative    50. Trichophyton mentagrophytes Negative    51. Mite, D Farinae  5,000 AU/ml Negative    52. Mite, D Pteronyssinus  5,000 AU/ml Negative    53. Cat Hair 10,000 BAU/ml 3+    54.  Dog Epithelia Negative    55. Mixed Feathers Negative    56. Horse Epithelia 3+    57. Cockroach, German 2+    58. Mouse 2+    59. Tobacco Leaf 2+             Allergy testing results were read and interpreted by myself, documented by clinical staff.  Assessment and Plan   Patient Instructions  Delayed Large Localized Reactions to both Covid vaccine and Flu vaccines:  - this is not a contraindication to getting either of these vaccines - it is possible that this type of reaction will recur with either of these vaccines, HOWEVER, your risk of a severe life-threatening reaction remains VERY LOW - If you would feel more comfortable we are more than happy to give these in our  clinic where we can monitor you for a more prolonged period and treat should a reaction occur (we can even do a graded vaccination challenge if you would feel more comfortable)-we would only do one at a time  Chronic Rhinitis -seasonal and perennial allergic rhinitis: - allergy testing today was positive to grasses, weeds, trees, molds, cat, horse, cockroach, mouse and tobacco leaf - allergen avoidance as below - Start Nasal Steroid Spray: Options include Flonase (fluticasone), Nasocort (triamcinolone), Nasonex (mometasome) 1- 2 sprays in each nostril daily (can buy over-the-counter if not covered by insurance)  Best results if used daily. -  Ask your Cardiologist if he is okay with you taking  Atrovent (Ipratropium Bromide) 1-2 sprays in each nostril up to 3 times a day as needed for runny nose/post nasal drip/drainage.   I do not see any contraindications. If he agrees, let me know and I will prescribe.  H/O Penicillin allergy: - please schedule follow-up appt at your convenience for penicillin testing followed by graded oral challenge if indicated - please refrain from taking any antihistamines at least 5 days prior to this appointment (okay to continue using nasal sprays like Flonase (fluticasone) or Atrovent (ipratropium) or medications like Singulair (montelukast) if you are already taking them) - around 80% of individuals outgrow this allergy in ~ 10 years and carrying it as a diagnosis can prevent you from getting proper therapy if needed  Red Meat Intolerance:  - based on history, do not suspect alpha gal, but rather an intolerance to red meat (likely from not eating on a regular basis) - avoidance recommended to prevent symptoms  Follow-up for vaccines at your convenience.  This note in its entirety was forwarded to the Provider who requested this consultation.  Thank you for your kind referral. I appreciate the opportunity to  take part in [facility] care. Please do not hesitate to  contact me with questions.  Sincerely,  Sigurd Sos, MD [facility]

## 2020-12-31 NOTE — Patient Instructions (Addendum)
Delayed Large Localized Reactions to both Covid vaccine and Flu vaccines:  - this is not a contraindication to getting either of these vaccines - it is possible that this type of reaction will recur with either of these vaccines, HOWEVER, your risk of a severe life-threatening reaction remains VERY LOW - If you would feel more comfortable we are more than happy to give these in our clinic where we can monitor you for a more prolonged period and treat should a reaction occur (we can even do a graded vaccination challenge if you would feel more comfortable)-we would only do one at a time  Chronic Rhinitis -seasonal and perennial allergic rhinitis: - allergy testing today was positive to grasses, weeds, trees, molds, cat, horse, cockroach, mouse and tobacco leaf - allergen avoidance as below - Start Nasal Steroid Spray: Options include Flonase (fluticasone), Nasocort (triamcinolone), Nasonex (mometasome) 1- 2 sprays in each nostril daily (can buy over-the-counter if not covered by insurance)  Best results if used daily. -  Ask your Cardiologist if he is okay with you taking  Atrovent (Ipratropium Bromide) 1-2 sprays in each nostril up to 3 times a day as needed for runny nose/post nasal drip/drainage.   I do not see any contraindications. If he agrees, let me know and I will prescribe.  H/O Penicillin allergy: - please schedule follow-up appt at your convenience for penicillin testing followed by graded oral challenge if indicated - please refrain from taking any antihistamines at least 5 days prior to this appointment (okay to continue using nasal sprays like Flonase (fluticasone) or Atrovent (ipratropium) or medications like Singulair (montelukast) if you are already taking them) - around 80% of individuals outgrow this allergy in ~ 10 years and carrying it as a diagnosis can prevent you from getting proper therapy if needed  Follow-up for vaccines at your convenience.  Reducing Pollen  Exposure  The American Academy of Allergy, Asthma and Immunology suggests the following steps to reduce your exposure to pollen during allergy seasons.    Do not hang sheets or clothing out to dry; pollen may collect on these items. Do not mow lawns or spend time around freshly cut grass; mowing stirs up pollen. Keep windows closed at night.  Keep car windows closed while driving. Minimize morning activities outdoors, a time when pollen counts are usually at their highest. Stay indoors as much as possible when pollen counts or humidity is high and on windy days when pollen tends to remain in the air longer. Use air conditioning when possible.  Many air conditioners have filters that trap the pollen spores. Use a HEPA room air filter to remove pollen form the indoor air you breathe.  Control of Mold Allergen   Mold and fungi can grow on a variety of surfaces provided certain temperature and moisture conditions exist.  Outdoor molds grow on plants, decaying vegetation and soil.  The major outdoor mold, Alternaria and Cladosporium, are found in very high numbers during hot and dry conditions.  Generally, a late Summer - Fall peak is seen for common outdoor fungal spores.  Rain will temporarily lower outdoor mold spore count, but counts rise rapidly when the rainy period ends.  The most important indoor molds are Aspergillus and Penicillium.  Dark, humid and poorly ventilated basements are ideal sites for mold growth.  The next most common sites of mold growth are the bathroom and the kitchen.  Outdoor (Seasonal) Mold Control  Positive outdoor molds via skin testing:   Use air  conditioning and keep windows closed Avoid exposure to decaying vegetation. Avoid leaf raking. Avoid grain handling. Consider wearing a face mask if working in moldy areas.    Indoor (Perennial) Mold Control   Positive indoor molds via skin testing:   Maintain humidity below 50%. Clean washable surfaces with 5%  bleach solution. Remove sources e.g. contaminated carpets.  Control of Cockroach Allergen  Cockroach allergen has been identified as an important cause of acute attacks of asthma, especially in urban settings.  There are fifty-five species of cockroach that exist in the [place], however only three, the [place], [place] species produce allergen that can affect patients with Asthma.  Allergens can be obtained from fecal particles, egg casings and secretions from cockroaches.    Remove food sources. Reduce access to water. Seal access and entry points. Spray runways with 0.5-1% Diazinon or Chlorpyrifos Blow boric acid power under stoves and refrigerator. Place bait stations (hydramethylnon) at feeding sites.  Control of Dog or Cat Allergen  Avoidance is the best way to manage a dog or cat allergy. If you have a dog or cat and are allergic to dog or cats, consider removing the dog or cat from the home. If you have a dog or cat but don't want to find it a new home, or if your family wants a pet even though someone in the household is allergic, here are some strategies that may help keep symptoms at bay:  Keep the pet out of your bedroom and restrict it to only a few rooms. Be advised that keeping the dog or cat in only one room will not limit the allergens to that room. Don't pet, hug or kiss the dog or cat; if you do, wash your hands with soap and water. High-efficiency particulate air (HEPA) cleaners run continuously in a bedroom or living room can reduce allergen levels over time. Regular use of a high-efficiency vacuum cleaner or a central vacuum can reduce allergen levels. Giving your dog or cat a bath at least once a week can reduce airborne allergen.

## 2021-01-01 ENCOUNTER — Ambulatory Visit
Admission: RE | Admit: 2021-01-01 | Discharge: 2021-01-01 | Disposition: A | Discharge status: Home / Self Care | Payer: Medicare Other | Source: Ambulatory Visit | Attending: Medical | Admitting: Medical

## 2021-01-01 DIAGNOSIS — E2839 Other primary ovarian failure: Secondary | ICD-10-CM

## 2021-01-01 DIAGNOSIS — Z1231 Encounter for screening mammogram for malignant neoplasm of breast: Secondary | ICD-10-CM | POA: Diagnosis not present

## 2021-01-01 DIAGNOSIS — Z780 Asymptomatic menopausal state: Secondary | ICD-10-CM | POA: Diagnosis not present

## 2021-01-01 DIAGNOSIS — M810 Age-related osteoporosis without current pathological fracture: Secondary | ICD-10-CM | POA: Diagnosis not present

## 2021-01-05 ENCOUNTER — Other Ambulatory Visit: Payer: Self-pay

## 2021-01-05 ENCOUNTER — Ambulatory Visit (INDEPENDENT_AMBULATORY_CARE_PROVIDER_SITE_OTHER): Payer: Medicare Other | Admitting: Medical

## 2021-01-05 VITALS — BP 110/70 | HR 72 | Wt 152.2 lb

## 2021-01-05 DIAGNOSIS — M810 Age-related osteoporosis without current pathological fracture: Secondary | ICD-10-CM | POA: Diagnosis not present

## 2021-01-05 DIAGNOSIS — Z79899 Other long term (current) drug therapy: Secondary | ICD-10-CM | POA: Diagnosis not present

## 2021-01-05 DIAGNOSIS — R7989 Other specified abnormal findings of blood chemistry: Secondary | ICD-10-CM

## 2021-01-05 DIAGNOSIS — F411 Generalized anxiety disorder: Secondary | ICD-10-CM

## 2021-01-05 DIAGNOSIS — Z780 Asymptomatic menopausal state: Secondary | ICD-10-CM | POA: Diagnosis not present

## 2021-01-05 DIAGNOSIS — F410 Panic disorder [episodic paroxysmal anxiety] without agoraphobia: Secondary | ICD-10-CM

## 2021-01-05 DIAGNOSIS — E039 Hypothyroidism, unspecified: Secondary | ICD-10-CM | POA: Diagnosis not present

## 2021-01-05 MED ORDER — VITAMIN D 25 MCG (1000 UNIT) PO TABS: 1000.0000 [IU] | ORAL_TABLET | Freq: Every day | ORAL | 3 refills | Status: DC

## 2021-01-05 NOTE — Patient Instructions (Signed)
Taking medications to treat osteoporosis reduces fracture risk by 50-70%!   1 in 22 American women and 1 in 40 men over age 72 years old have osteoporosis.    Recommendations:   Regular exercise including aerobic and weightbearing exercise, vitamin D supplementation, limiting alcohol and not smoking.    We generally recommend adding a medication at this point to reduce your risk of fracture by reducing the rate of bone turnover or helping to increase bone mass.  Examples would be Fosamax weekly oral medication, Prolia injection every 6 months, or other options.   I want you to consider the options below, then let me know what you may want to do.   All of the medications have potential risks, but they all have the benefit of slowing down the rate of bone turnover as we get older, helping to reduce the risk of a bone fracture.  Options for therapy:   Bisphosphonate such as alendronate (Fosamax) This is a medication given weekly that strengthens bones by slowing down the rate of bone loss.  This is usually the first-line medication given the lower cost and does not require injection.  Many people tolerate this medication but some do not.  This medicine has to be taken weekly first thing in the morning on empty stomach, and you cannot lie down for an hour after taking the medication.  Risks of the medication includes trouble swallowing the medication, inflammation of the esophagus, gastric ulcers, and rare risk of breakdown of the jawbone.  These medications are usually given for 5 years (3 years if injectable bisphosphonate like Reclast).   Evenity  Compared with bisphosphonates, this type of medication called monoclonal antibodies produce similar or better bone density results and reduces the chance of all types of fractures. Evenity is delivered via a shot under the skin every month.   Recent research indicates there could be a high risk of spinal column fractures after stopping the drug.  A very  rare complication of bisphosphonates and denosumab is a break or crack in the middle of the thighbone.  A second rare complication is delayed healing of the jawbone (osteonecrosis of the jaw). This can occur after an invasive dental procedure such as removing a tooth.   Forteo This medication increases bone density and strength, it is a synthetic version of the parathyroid hormone, and it is given as a daily injection for 2 years.  Risk include leg cramps, nausea, dizziness, elevated calcium, joint pain, and rare risk of bone cancer in animal studies.  Prolia This is an injection given twice yearly that prevents bone dissolving osteoclast cells from forming.  Its a good option for women who can't tolerate bisphosphonate.  The advantages not having to take something every day or every month and it is well-tolerated for the most part.  This type of medication is called a monoclonal antibody, so there are risk of low blood calcium, skin infections, rash, and rarer potential risks are breakdown or death of jawbone, and rare risk of fracture of the thigh bone.  This medication is given ongoing.  Calcitonin  This is an old drug that helps prevent bone loss, used as a daily nasal spray or injection.  It  reduces spinal fractures, but is not effective in other types of fractures so it is usually not the first-line option.  Side effects can include flushing, rash.  There is a small increase in cancer risk with this medication.  Evista This is a selective estrogen receptor  modulator (SERM), and is used in breast cancer prevention and treatment.  It is also used in treatment of osteoporosis.   It is used in women to reduce risk of vertebral fractures.  Side effects include hot flashes, muscle pain, and increased risks of blood clots in the leg.

## 2021-01-05 NOTE — Addendum Note (Signed)
Addended by: Carlena Hurl on: 01/05/2021 04:43 PM   Modules accepted: Orders

## 2021-01-05 NOTE — Progress Notes (Addendum)
Subjective:  Rita Wright is a 70 y.o. female who presents for Chief Complaint  Patient presents with   discuss bone density    Discuss bone density     Here for follow-up on recent testing.  After years of avoiding getting mammogram and bone density due to her severe phobia of heights and using elevators and escalators limiting her ability to go have these test done, she was able to recently have these test done on the mobile imaging truck  She has no breast concerns  She has history of osteoporosis.  She has tried [place] and Fosamax in the past but had horrible side effects of both as noted in the allergies.  She is not real keen on using other prescription medicines as she does not want to have more pills  She is taking 400 units of vitamin D daily and gets 4 servings of dairy daily.  She has limited exercise and no real weightbearing exercise  Any new medicine she would want to have her cardiologist weigh in on regarding safety  Here to discuss her recent elevated liver test.  She sees gastroenterology, Dr. Aron Wright in December about a month from now   Regarding her recent cholesterol labs, she blames her diet as she was eating steak every single day.  She has since cut this out.  She has not started Crestor as she does not want to add more medicines  No other aggravating or relieving factors.    No other c/o.  The following portions of the patient's history were reviewed and updated as appropriate: allergies, current medications, past family history, past medical history, past social history, past surgical history and problem list.  ROS Otherwise as in subjective above   Objective: BP 110/70   Pulse 72   Wt 152 lb 3.2 oz (69 kg)   LMP 02/23/1992   BMI 25.33 kg/m   General appearance: alert, no distress, well developed, well nourished Otherwise not examined   Assessment: Encounter Diagnoses  Name Primary?   Osteoporosis without current pathological fracture,  unspecified osteoporosis type Yes   Post-menopausal    Panic disorder    Hypothyroidism, unspecified type    High risk medication use    Generalized anxiety disorder    Elevated liver function tests      Plan: Here to review her recent imaging  Thankfully her mammogram recently was normal.  I praised her on finally getting these test out of the way with the mobile truck as her extreme phobias of heights prevented her from going to other typical imaging places  Osteoporosis-discussed the significance of these findings, potential complications and risk.  She is getting plenty of calcium daily in the diet.  I recommended we changed to vitamin D 1000 units daily.  We discussed aerobic and weightbearing exercise.  We discussed medication to help slow down bone resorption versus bone building medications.  She is not really excited about taking anything new.  I recommended Prolia or Evenity.  She will consider but she wants to talk with her dentist and cardiologist first  Hypothyroidism-continue current medications  Elevated liver test-we discussed the many potential causes of abnormal liver test.  Her recent liver ultrasound was normal.  She has been tested and negative for hepatitis B and C in the past few years.  Consider autoimmune or other causes.  I still wonder about possibly early fatty liver disease despite the ultrasound results.  She had a normal iron level October of last year,  so likely not hemochromatosis  Hyperlipidemia-she has not yet started her statin.  I recommended she begin at least half tablet daily which has been recommended as at least the next step by [place] and cardiology.  She is very cautious about starting new medications.  She definitely has risk and should be taking a statin.  We reviewed her recent labs    Rita Wright was seen today for discuss bone density.  Diagnoses and all orders for this visit:  Osteoporosis without current pathological fracture, unspecified  osteoporosis type  Post-menopausal  Panic disorder  Hypothyroidism, unspecified type  High risk medication use  Generalized anxiety disorder  Elevated liver function tests  Other orders -     cholecalciferol (VITAMIN D3) 25 MCG (1000 UNIT) tablet; Take 1 tablet (1,000 Units total) by mouth daily.   Follow up: Pending, GI consult

## 2021-02-02 DIAGNOSIS — Z1211 Encounter for screening for malignant neoplasm of colon: Secondary | ICD-10-CM | POA: Diagnosis not present

## 2021-02-02 DIAGNOSIS — R945 Abnormal results of liver function studies: Secondary | ICD-10-CM | POA: Diagnosis not present

## 2021-02-11 ENCOUNTER — Other Ambulatory Visit: Payer: Self-pay

## 2021-02-11 ENCOUNTER — Encounter (INDEPENDENT_AMBULATORY_CARE_PROVIDER_SITE_OTHER): Payer: Self-pay | Admitting: Ophthalmology

## 2021-02-11 ENCOUNTER — Ambulatory Visit (INDEPENDENT_AMBULATORY_CARE_PROVIDER_SITE_OTHER): Payer: Medicare Other | Admitting: Ophthalmology

## 2021-02-11 DIAGNOSIS — D3131 Benign neoplasm of right choroid: Secondary | ICD-10-CM | POA: Diagnosis not present

## 2021-02-11 NOTE — Progress Notes (Signed)
02/11/2021     CHIEF COMPLAINT Patient presents for  Chief Complaint  Patient presents with   Retina Follow Up      HISTORY OF PRESENT ILLNESS: Rita Wright is a 70 y.o. female who presents to the clinic today for:   HPI     Retina Follow Up           Diagnosis: Other   Laterality: both eyes   Onset: 1 year ago   Severity: mild   Duration: 1 year   Course: stable         Comments   1 yr fu OCT OU. Patient states vision is stable and unchanged since last visit. Denies any new floaters or FOL. Pt allergy erythromycin.       Last edited by Laurin Coder on 02/11/2021  8:26 AM.      Referring physician: Carlena Hurl, PA-C [place],  [place] [address]  HISTORICAL INFORMATION:   Selected notes from the MEDICAL RECORD NUMBER    Lab Results  Component Value Date   HGBA1C 5.7 (H) 12/10/2020     CURRENT MEDICATIONS: No current outpatient medications on file. (Ophthalmic Drugs)   No current facility-administered medications for this visit. (Ophthalmic Drugs)   Current Outpatient Medications (Other)  Medication Sig   cholecalciferol (VITAMIN D) 400 UNITS TABS Take 400 Units by mouth.   cholecalciferol (VITAMIN D3) 25 MCG (1000 UNIT) tablet Take 1 tablet (1,000 Units total) by mouth daily.   folic acid (FOLVITE) 1 MG tablet Take 1 tablet (1 mg total) by mouth daily.   levothyroxine (SYNTHROID) 100 MCG tablet TAKE 1 TABLET BY MOUTH EVERY DAY BEFORE BREAKFAST   metoprolol tartrate (LOPRESSOR) 50 MG tablet Take 1 tablet (50 mg total) by mouth 2 (two) times daily.   Rivaroxaban (XARELTO) 15 MG TABS tablet Take 1 tablet (15 mg total) by mouth once for 1 dose.   rosuvastatin (CRESTOR) 10 MG tablet Take 1 tablet (10 mg total) by mouth daily. (Patient not taking: No sig reported)   No current facility-administered medications for this visit. (Other)      REVIEW OF SYSTEMS:    ALLERGIES Allergies  Allergen Reactions   Valsartan  Cough   Boniva [Ibandronic Acid]     Horrible joint pains   Erythromycin     GI upset   Fosamax [Alendronate]     Arthralgias, myalgias    Prednisone Other (See Comments)    Jittery, insomnia, intolerance   Penicillins Hives, Rash and Other (See Comments)    Felt like she was going to pass out.    PAST MEDICAL HISTORY Past Medical History:  Diagnosis Date   Colon cancer screening 09/2014   Cologuard test negative    Depression    Dilated cardiomyopathy ([place]) 05/2014   Dr. Woody Seller   Echocardiogram abnormal 05/2014   mild dilation of Left Ventricle; Dr. Woody Seller   Hashimoto's thyroiditis    History of mammogram 10/10/14   normal   Multinodular goiter    Noncompliance with diagnostic testing    due to severe anxiety   Osteoporosis    Panic disorder    severe, doesn't drive, doesn't use stairs or elevators, severely limited by her anxiety   Renal insufficiency    Thyroid nodule 9/62/83   benign follicular nodule on biopsy   Past Surgical History:  Procedure Laterality Date   COLONOSCOPY  2014   cologuard 2016; refuses colonoscopy   [place]  FAMILY HISTORY Family History  Problem Relation Age of Onset   Hypertension Mother    Parkinson's disease Father    Heart failure Sister    Hypertension Brother    Cerebral aneurysm Sister    Throat cancer Brother     SOCIAL HISTORY Social History   Tobacco Use   Smoking status: Former    Packs/day: 1.50    Years: 35.00    Pack years: 52.50    Types: Cigarettes    Quit date: 02/23/1992    Years since quitting: 28.9   Smokeless tobacco: Never  Vaping Use   Vaping Use: Never used  Substance Use Topics   Alcohol use: No   Drug use: No         OPHTHALMIC EXAM:  Base Eye Exam     Visual Acuity (ETDRS)       Right Left   Dist cc 20/20 20/20         Tonometry (Tonopen, 8:27 AM)       Right Left   Pressure 13 13         Pupils       Pupils Dark Light APD   Right PERRL 4 3 None   Left PERRL  4 3 None         Visual Fields (Counting fingers)       Left Right    Full Full         Extraocular Movement       Right Left    Full Full         Neuro/Psych     Oriented x3: Yes   Mood/Affect: Normal         Dilation     Both eyes: 1.0% Mydriacyl, 2.5% Phenylephrine @ 8:27 AM           Slit Lamp and Fundus Exam     External Exam       Right Left   External Normal Normal         Slit Lamp Exam       Right Left   Lids/Lashes Normal Normal   Conjunctiva/Sclera White and quiet White and quiet   Cornea Clear Clear   Anterior Chamber Deep and quiet Deep and quiet   Iris Round and reactive Round and reactive   Lens Nuclear sclerosis Nuclear sclerosis   Anterior Vitreous Normal Normal         Fundus Exam       Right Left   Posterior Vitreous Normal Normal   Disc Normal Normal   C/D Ratio 0.35 0.35   Macula Normal, no exudates, no macular thickening, no hemorrhage Normal, no exudates, no macular thickening, no hemorrhage   Vessels Normal Normal   Periphery Choroidal nevus, nasal to nerve, juxtapapillary, with broadest dimension being approximately six disc diameters horizontally and approximately five and half vertically, no adjacent subretinal fluid, no rim of atrophy, no obvious vascularity, no lipofusci, , , n, white pigmentary change Each of which is stable Normal            IMAGING AND PROCEDURES  Imaging and Procedures for 02/11/21  Color Fundus Photography Optos - OU - Both Eyes       Right Eye Progression has been stable.   Left Eye Progression has been stable.   Notes Pigmented choroidal nevus with no irregular pigmentation, dome has no  Lipofuscin, and stable white drusenoid appearance over the dome.  There is no adjacent subretinal fluid there is  no obvious vascularity there is no ring of atrophy. Red free OD, region of hypopigmentation is not fluid     B-Scan Ultrasound - OD - Right Eye       Quality was good.    Notes Choroidal nevus nasal to nerve, no adjacent subretinal fluid, homogeneous reflectivity, no obvious vascular lakes  Central thickness 1.89 mm             ASSESSMENT/PLAN:  Choroidal nevus of right eye Risk factors for malignant transformation of a choroidal nevus: [  ]thickness greater than 3 mm,  [  ]subretinal fluid,  [  ]symptoms,  [  ]orange pigment present,  [ x ]margin within 3 mm of the optic disc,  [ x ]ultrasonographic hollowness (versus solid/flat), medium internal reflectivity on scan [  ]absence of halo (A halo refers to a pigmented choroidal nevus surrounded by a circular band of depigmentation) [  ]absence of drusen [ x ]size larger than 27mm basal diameter (over 3 dd size) [  ]observed growth  AJO: 202, June 2019, 100-108  The nature of choroidal nevus was discussed with the patient and an informational form was offered.  Photo documentation was discussed with the patient.  Periodic follow-up may be needed for a lifetime. The patient's questions were answered. At minimum, annual exams will be needed if no signs of early growth.      ICD-10-CM   1. Choroidal nevus of right eye  D31.31 Color Fundus Photography Optos - OU - Both Eyes    B-Scan Ultrasound - OD - Right Eye    CANCELED: OCT, Retina - OU - Both Eyes      1.  OD stable lesion, choroidal nevus nasal to nerve now for some 9 to 10 years.  Medium size nevus, with no demonstrable growth no demonstrable risk features other than location and medium size.  2.  3.  Ophthalmic Meds Ordered this visit:  No orders of the defined types were placed in this encounter.      Return in about 1 year (around 02/11/2022) for DILATE OU, COLOR FP, B-Scan U/S, OD.  There are no Patient Instructions on file for this visit.   Explained the diagnoses, plan, and follow up with the patient and they expressed understanding.  Patient expressed understanding of the importance of proper follow up care.   Clent Demark Franziska Podgurski M.D. Diseases & Surgery of the Retina and Vitreous [facility] [place] 02/11/21     Abbreviations: M myopia (nearsighted); A astigmatism; H hyperopia (farsighted); P presbyopia; Mrx spectacle prescription;  CTL contact lenses; OD right eye; OS left eye; OU both eyes  XT exotropia; ET esotropia; PEK punctate epithelial keratitis; PEE punctate epithelial erosions; DES dry eye syndrome; MGD meibomian gland dysfunction; ATs artificial tears; PFAT's preservative free artificial tears; [place] nuclear sclerotic cataract; PSC posterior subcapsular cataract; ERM epi-retinal membrane; PVD posterior vitreous detachment; RD retinal detachment; DM diabetes mellitus; DR diabetic retinopathy; NPDR non-proliferative diabetic retinopathy; PDR proliferative diabetic retinopathy; CSME clinically significant macular edema; DME diabetic macular edema; dbh dot blot hemorrhages; CWS cotton wool spot; POAG primary open angle glaucoma; C/D cup-to-disc ratio; HVF humphrey visual field; GVF goldmann visual field; OCT optical coherence tomography; IOP intraocular pressure; BRVO Branch retinal vein occlusion; CRVO central retinal vein occlusion; CRAO central retinal artery occlusion; BRAO branch retinal artery occlusion; RT retinal tear; SB scleral buckle; PPV pars plana vitrectomy; VH Vitreous hemorrhage; PRP panretinal laser photocoagulation; IVK intravitreal kenalog; VMT vitreomacular traction; MH Macular hole;  NVD neovascularization of the disc; NVE neovascularization elsewhere; AREDS age related eye disease study; ARMD age related macular degeneration; POAG primary open angle glaucoma; EBMD epithelial/anterior basement membrane dystrophy; ACIOL anterior chamber intraocular lens; IOL intraocular lens; PCIOL posterior chamber intraocular lens; Phaco/IOL phacoemulsification with intraocular lens placement; [place] photorefractive keratectomy; LASIK laser assisted in situ keratomileusis; HTN hypertension; DM diabetes  mellitus; COPD chronic obstructive pulmonary disease

## 2021-02-11 NOTE — Assessment & Plan Note (Signed)
Risk factors for malignant transformation of a choroidal nevus: [  ]thickness greater than 3 mm,  [  ]subretinal fluid,  [  ]symptoms,  [  ]orange pigment present,  [ x ]margin within 3 mm of the optic disc,  [ x ]ultrasonographic hollowness (versus solid/flat), medium internal reflectivity on scan [  ]absence of halo (A halo refers to a pigmented choroidal nevus surrounded by a circular band of depigmentation) [  ]absence of drusen [ x ]size larger than 61mm basal diameter (over 3 dd size) [  ]observed growth  AJO: 202, June 2019, 100-108  The nature of choroidal nevus was discussed with the patient and an informational form was offered.  Photo documentation was discussed with the patient.  Periodic follow-up may be needed for a lifetime. The patient's questions were answered. At minimum, annual exams will be needed if no signs of early growth.

## 2021-03-06 DIAGNOSIS — Z1211 Encounter for screening for malignant neoplasm of colon: Secondary | ICD-10-CM | POA: Diagnosis not present

## 2021-03-06 DIAGNOSIS — Z1212 Encounter for screening for malignant neoplasm of rectum: Secondary | ICD-10-CM | POA: Diagnosis not present

## 2021-03-13 LAB — COLOGUARD: COLOGUARD: NEGATIVE

## 2021-03-13 LAB — EXTERNAL GENERIC LAB PROCEDURE: COLOGUARD: NEGATIVE

## 2021-04-03 ENCOUNTER — Other Ambulatory Visit: Payer: Self-pay

## 2021-04-03 ENCOUNTER — Ambulatory Visit: Payer: Medicare Other | Admitting: Cardiology

## 2021-04-03 ENCOUNTER — Encounter: Payer: Self-pay | Admitting: Cardiology

## 2021-04-03 VITALS — BP 112/68 | HR 79 | Temp 97.1°F | Resp 17 | Ht 65.0 in | Wt 151.2 lb

## 2021-04-03 DIAGNOSIS — E7800 Pure hypercholesterolemia, unspecified: Secondary | ICD-10-CM | POA: Diagnosis not present

## 2021-04-03 DIAGNOSIS — I428 Other cardiomyopathies: Secondary | ICD-10-CM

## 2021-04-03 DIAGNOSIS — I4821 Permanent atrial fibrillation: Secondary | ICD-10-CM

## 2021-04-03 DIAGNOSIS — N1831 Chronic kidney disease, stage 3a: Secondary | ICD-10-CM

## 2021-04-03 NOTE — Progress Notes (Signed)
Primary Physician:  Carlena Hurl, PA-C   Patient ID: Rita Wright, female    DOB: Apr 12, 1950, 71 y.o.   MRN: [id number]  Subjective:    Chief Complaint  Patient presents with   Follow-up    1 year   Atrial Fibrillation   Cardiomyopathy    HPI: Rita Wright  is a 71 y.o. 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   By nuclear stress test in 2019, felt to be nonischemic.  Patient did not want cardioversion or atrial fibrillation ablation evaluation.  She is also very averse to starting any medications.  This is a 19-month office visit.  Denies any chest pain, dyspnea, continues to exercise regularly.  Tolerating anticoagulation without bleeding diathesis.  She has started on lipid-lowering therapy since October 2022.  She has chronically elevated mild LFTs.  She is being followed by GI as well.  Past Medical History:  Diagnosis Date   Colon cancer screening 09/2014   Cologuard test negative    Depression    Dilated cardiomyopathy ([place]) 05/2014   Dr. Woody Seller   Echocardiogram abnormal 05/2014   mild dilation of Left Ventricle; Dr. Woody Seller   Hashimoto's thyroiditis    History of mammogram 10/10/14   normal   Multinodular goiter    Noncompliance with diagnostic testing    due to severe anxiety   Osteoporosis    Panic disorder    severe, doesn't drive, doesn't use stairs or elevators, severely limited by her anxiety   Renal insufficiency    Thyroid nodule 7/32/20   benign follicular nodule on biopsy    Past Surgical History:  Procedure Laterality Date   COLONOSCOPY  2014   cologuard 2016; refuses colonoscopy   TUBAL LIGATION  1986   Social History   Tobacco Use   Smoking status: Former    Packs/day: 1.50    Years: 35.00    Pack years: 52.50    Types: Cigarettes    Quit  date: 02/23/1992    Years since quitting: 29.1   Smokeless tobacco: Never  Substance Use Topics   Alcohol use: No   Marital Status: Married   Review of Systems  Cardiovascular:  Negative for chest pain, dyspnea on exertion and leg swelling.  Gastrointestinal:  Negative for melena.  Psychiatric/Behavioral:  Positive for depression. The patient is nervous/anxious.   Objective:  Blood pressure 112/68, pulse 79, temperature (!) 97.1 F (36.2 C), resp. rate 17, height 5\' 5"  (1.651 m), weight 151 lb 3.2 oz (68.6 kg), last menstrual period 02/23/1992, SpO2 97 %. Body mass index is 25.16 kg/m.   Vitals with BMI 04/03/2021 01/05/2021 12/31/2020  Height 5\' 5"  - 5\' 5"   Weight 151 lbs 3 oz 152 lbs 3 oz 151 lbs 4 oz  BMI 25.16 25.42 70.62  Systolic 376 283 151  Diastolic 68 70 78  Pulse 79 72 75       Physical Exam Vitals reviewed.  Constitutional:      Appearance: She is well-developed.  Cardiovascular:     Rate and Rhythm: Normal rate. Rhythm irregularly irregular.     Pulses:          Carotid pulses are 2+ on the right side and 2+ on the left side.      Femoral pulses are 2+ on the right side and 2+ on the left  side.      Popliteal pulses are 2+ on the right side and 2+ on the left side.       Dorsalis pedis pulses are 1+ on the right side and 1+ on the left side.       Posterior tibial pulses are 1+ on the right side and 1+ on the left side.     Heart sounds: Normal heart sounds.     Comments: Acrocyanosis bilaterally. No pedal edema. No JVD. Pulmonary:     Effort: Pulmonary effort is normal. No accessory muscle usage or respiratory distress.     Breath sounds: Normal breath sounds.  Abdominal:     General: Bowel sounds are normal.     Palpations: Abdomen is soft.      Current Outpatient Medications:    cholecalciferol (VITAMIN D3) 25 MCG (1000 UNIT) tablet, Take 1 tablet (1,000 Units total) by mouth daily., Disp: 90 tablet, Rfl: 3   folic acid (FOLVITE) 1 MG tablet, Take 1  tablet (1 mg total) by mouth daily., Disp: 90 tablet, Rfl: 3   levothyroxine (SYNTHROID) 100 MCG tablet, TAKE 1 TABLET BY MOUTH EVERY DAY BEFORE BREAKFAST, Disp: 90 tablet, Rfl: 3   metoprolol tartrate (LOPRESSOR) 50 MG tablet, Take 1 tablet (50 mg total) by mouth 2 (two) times daily., Disp: 180 tablet, Rfl: 0   Rivaroxaban (XARELTO) 15 MG TABS tablet, Take 1 tablet (15 mg total) by mouth once for 1 dose., Disp: 90 tablet, Rfl: 0   rosuvastatin (CRESTOR) 10 MG tablet, Take 1 tablet (10 mg total) by mouth daily. (Patient not taking: Reported on 04/03/2021), Disp: 90 tablet, Rfl: 3  Radiology: No results found.  Laboratory examination:    CMP Latest Ref Rng & Units 12/10/2020 04/29/2020 11/07/2019  Glucose 70 - 99 mg/dL 99 109(H) 110(H)  BUN 8 - 27 mg/dL 42(H) 46(H) 48(H)  Creatinine 0.57 - 1.00 mg/dL 0.88 1.11(H) 1.06(H)  Sodium 134 - 144 mmol/L 141 142 139  Potassium 3.5 - 5.2 mmol/L 4.6 4.6 4.7  Chloride 96 - 106 mmol/L 102 102 101  CO2 20 - 29 mmol/L 23 25 28   Calcium 8.7 - 10.3 mg/dL 9.8 9.3 9.1  Total Protein 6.0 - 8.5 g/dL 6.9 - -  Total Bilirubin 0.0 - 1.2 mg/dL 0.8 - -  Alkaline Phos 44 - 121 IU/L 96 - -  AST 0 - 40 IU/L 40 - -  ALT 0 - 32 IU/L 55(H) - -   CBC Latest Ref Rng & Units 12/10/2020 04/29/2020 12/10/2019  WBC 3.4 - 10.8 x10E3/uL 6.7 5.5 5.5  Hemoglobin 11.1 - 15.9 g/dL 16.6(H) 15.4 16.1(H)  Hematocrit 34.0 - 46.6 % 50.4(H) 47.2(H) 48.9(H)  Platelets 150 - 450 x10E3/uL 151 158 168   Lipid Panel     Component Value Date/Time   CHOL 255 (H) 12/10/2020 0958   TRIG 77 12/10/2020 0958   HDL 97 12/10/2020 0958   CHOLHDL 2.6 12/10/2020 0958   CHOLHDL 2.5 09/01/2016 0818   VLDL 12 09/01/2016 0818   LDLCALC 145 (H) 12/10/2020 0958   HEMOGLOBIN A1C Lab Results  Component Value Date   HGBA1C 5.7 (H) 12/10/2020   TSH Recent Labs    12/10/20 0958  TSH 5.090*   PRN Meds:. Medications Discontinued During This Encounter  Medication Reason   cholecalciferol  (VITAMIN D) 400 UNITS TABS     Current Meds  Medication Sig   cholecalciferol (VITAMIN D3) 25 MCG (1000 UNIT) tablet Take 1 tablet (1,000 Units total) by  mouth daily.   folic acid (FOLVITE) 1 MG tablet Take 1 tablet (1 mg total) by mouth daily.   levothyroxine (SYNTHROID) 100 MCG tablet TAKE 1 TABLET BY MOUTH EVERY DAY BEFORE BREAKFAST   metoprolol tartrate (LOPRESSOR) 50 MG tablet Take 1 tablet (50 mg total) by mouth 2 (two) times daily.   Rivaroxaban (XARELTO) 15 MG TABS tablet Take 1 tablet (15 mg total) by mouth once for 1 dose.   Cardiac Studies:   Lexiscan myoview stress test 09/05/2017: 1. Lexiscan stress test was performed. Exercise capacity was not assessed. Stress symptoms included headache, "panic like symptoms". Peak blood pressure was 162/102 mmHg. The resting and stress electrocardiogram demonstrated atrial fibrillation with rapid ventricular response, RBBB + LAHB, no resting arrhythmias and normal rest repolarization. Stress EKG is non diagnostic for ischemia as it is a pharmacologic stress. 2. The overall quality of the study is good. Left ventricular cavity is noted to be normal on the rest and stress studies. Gated SPECT images reveal global reduction in normal myocardial thickening and wall motion. The left ventricular ejection fraction was calculated or visually estimated to be 26%. LVEF could be underestimated due to gating difficulties during Afib w/RVR. Review of the raw data in a rotational cine format reveals breast attenuation with imaging performed in sitting position. REST and STRESS images demonstrate small sized area of mildly decreased tracer uptake in the basal inferoseptal, basal inferior, mid inferoseptal and mid inferior segments of the left ventricle with no reversibility. Findings most likely represent breast attenuation artifact. 3. High risk study due to reduced LVEF. Recommend echocardiogram, preferably with controlled ventricular rate, for accurate LVEF  estimation.  Lower Extremity Arterial Duplex 11/03/2018:  No hemodynamically significant stenoses are identified in the  lower extremity arterial system.  This exam reveals moderately decreased perfusion of the right lower extremity, noted at the post tibial artery level (ABI 0.75). This exam reveals moderately decreased perfusion of the left lower extremity, noted at the dorsalis pedis artery level (ABI 0.75).  Study suggests diffuse disease.  Echocardiogram 03/20/2020: Mildly depressed LV systolic function with visual EF 45-50%. Left ventricle cavity is normal in size. Normal global wall motion. Unable to evaluate diastolic function due to underlying rhythm appears to be atrial flutter.  Normal LAP.  Left atrial cavity is mildly dilated. Mild (Grade I) aortic regurgitation. Mild (Grade I) mitral regurgitation. Mild tricuspid regurgitation. No evidence of pulmonary hypertension. Compared to prior study dated 06/27/2019: LVEF has improved from 20-25% to 45-50%, moderate MR and TR improved to mild AR, MR, TR.  EKG: EKG 04/03/2021: Atrial fibrillation with controlled ventricular response, ventricular rate 87 bpm.  Left axis deviation, left anterior fascicular block.  Incomplete right bundle branch block.  Nonspecific T abnormality.  No change from 04/02/2020.  Assessment:     ICD-10-CM   1. Permanent atrial fibrillation ([place]). CHA2DS2-VASc Score is 4.  Yearly risk of stroke: 4.8% (A, F, HTN, PAD).  I48.21 EKG 12-Lead    2. Non-ischemic cardiomyopathy ([facility])  I42.8 EKG 12-Lead    3. Stage 3a chronic kidney disease ([facility])  N18.31     4. Hypercholesteremia  E78.00 Lipid Profile     No orders of the defined types were placed in this encounter.   Medications Discontinued During This Encounter  Medication Reason   cholecalciferol (VITAMIN D) 400 UNITS TABS      Orders Placed This Encounter  Procedures   Lipid Profile   EKG 12-Lead    Recommendations:   EMMALEA TREANOR  is a 71 y.o.  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 Per her wishes , she did not have cardioversion, ablation, or try antiarrhythmic therapy. She also did not want to try heart failure medications. She is on rate control therapy with Metoprolol 50mg  BID and Xarelto for anticoagulation that she is tolerating well.  Blood pressure is well controlled, heart rate is also well controlled.  I reviewed her external labs, renal function has remained stable, CBC stable.  She has agreed to start on statin therapy and was started by her PCP, needs lipid profile testing, she is getting blood drawn for elevated LFTs at the GI office, paper orders for lipid profile given to the patient and she will bring in the results.  Otherwise stable from cardiac standpoint, renal function has remained stable at stage III chronic kidney disease, I will see her back in a year.     Adrian Prows, MD, [facility] 04/03/2021, 9:06 AM Office: [phone]

## 2021-04-14 ENCOUNTER — Other Ambulatory Visit: Payer: Self-pay

## 2021-04-14 ENCOUNTER — Telehealth: Payer: Self-pay | Admitting: Medical

## 2021-04-14 DIAGNOSIS — I4891 Unspecified atrial fibrillation: Secondary | ICD-10-CM

## 2021-04-14 NOTE — Telephone Encounter (Signed)
Pt is requesting refills metoprolol and xarelto sent to meds by mail

## 2021-04-15 MED ORDER — METOPROLOL TARTRATE 50 MG PO TABS: 50.0000 mg | ORAL_TABLET | Freq: Two times a day (BID) | ORAL | 0 refills | Status: DC

## 2021-04-15 MED ORDER — RIVAROXABAN 15 MG PO TABS: 15.0000 mg | ORAL_TABLET | Freq: Once | ORAL | 0 refills | Status: DC

## 2021-04-15 NOTE — Telephone Encounter (Signed)
Refilled meds

## 2021-05-25 ENCOUNTER — Other Ambulatory Visit: Payer: Self-pay | Admitting: *Deleted

## 2021-05-25 ENCOUNTER — Telehealth (INDEPENDENT_AMBULATORY_CARE_PROVIDER_SITE_OTHER): Payer: Medicare Other | Admitting: Family Medicine

## 2021-05-25 ENCOUNTER — Encounter: Payer: Self-pay | Admitting: Family Medicine

## 2021-05-25 ENCOUNTER — Other Ambulatory Visit: Payer: Medicare Other

## 2021-05-25 VITALS — Temp 98.9°F | Ht 65.0 in | Wt 150.0 lb

## 2021-05-25 DIAGNOSIS — R197 Diarrhea, unspecified: Secondary | ICD-10-CM

## 2021-05-25 DIAGNOSIS — R112 Nausea with vomiting, unspecified: Secondary | ICD-10-CM | POA: Diagnosis not present

## 2021-05-25 LAB — POC COVID19 BINAXNOW: SARS Coronavirus 2 Ag: NEGATIVE

## 2021-05-25 MED ORDER — ONDANSETRON 4 MG PO TBDP: 4.0000 mg | ORAL_TABLET | Freq: Three times a day (TID) | ORAL | 0 refills | Status: DC | PRN

## 2021-05-25 NOTE — Progress Notes (Signed)
Start time:  2:28 ?End time: 2:42 ? ?Virtual Visit via Video Note ? ?I connected with Rita Wright on 05/25/21 by a video enabled telemedicine application and verified that I am speaking with the correct person using two identifiers. ? ?Location: ?Patient: home ?Provider: office ?  ?I discussed the limitations of evaluation and management by telemedicine and the availability of in person appointments. The patient expressed understanding and agreed to proceed. ? ?History of Present Illness: ? ?Chief Complaint  ?Patient presents with  ? Emesis  ?  VIRTUAL vomiting, diarrhea, dizziness and r ear popping-feels like fluid. Symptoms began 3:00 this am.   ? ?Husband woke up sick last night (with his vomiting/diarrhea). This woke her up, and she had trouble getting back to sleep.  She got up at 3 to eat something, but after the first bite she started feeling sick.  She was able to keep the scrambled egg and toast down, but wasn't settled.  Within the next 2 hours she had a normal BM.  Stomach then got upset, started vomiting around 8am.  Vomited just once, diarrhea 3x. [place] water) ? ?Denies any blood in emesis or bowel movements. ?She has had some mild abdominal pain, just above umbilicus. ?Got very dizzy when she got up from laying down. Felt more like vertigo, some lightheadedness.  Since then, her left ear popped and after that the dizziness has improved some. ?Felt better walking around recently (just prior to video visit, she no longer had vertigo). ? ?+allergy symptoms, sees allergist. Nothing significantly new. ?She was told she could use Flonase, but hasn't taken anything. ?Claritin was never helpful. ? ?+sick contacts--grandson, son and daughter-in-law all with similar GI illness within the week. ?No recent ABX, travel, raw/undercooked/spoiled foods. ? ? ?PMH, PSH, SH reviewed ? ?Afib, hypothyroid, HLD, cardiomyopathy ? ?Outpatient Encounter Medications as of 05/25/2021  ?Medication Sig  ? cholecalciferol  (VITAMIN D3) 25 MCG (1000 UNIT) tablet Take 1 tablet (1,000 Units total) by mouth daily.  ? folic acid (FOLVITE) 1 MG tablet Take 1 tablet (1 mg total) by mouth daily.  ? levothyroxine (SYNTHROID) 100 MCG tablet TAKE 1 TABLET BY MOUTH EVERY DAY BEFORE BREAKFAST  ? metoprolol tartrate (LOPRESSOR) 50 MG tablet Take 1 tablet (50 mg total) by mouth 2 (two) times daily.  ? Rivaroxaban (XARELTO) 15 MG TABS tablet Take 1 tablet (15 mg total) by mouth once for 1 dose.  ? rosuvastatin (CRESTOR) 10 MG tablet Take 1 tablet (10 mg total) by mouth daily. (Patient not taking: Reported on 04/03/2021)  ? ?No facility-administered encounter medications on file as of 05/25/2021.  ? ?Allergies  ?Allergen Reactions  ? Valsartan Cough  ? Boniva [Ibandronic Acid]   ?  Horrible joint pains  ? Erythromycin   ?  GI upset  ? Fosamax [Alendronate]   ?  Arthralgias, myalgias ?  ? Prednisone Other (See Comments)  ?  Jittery, insomnia, intolerance  ? Penicillins Hives, Rash and Other (See Comments)  ?  Felt like she was going to pass out.  ? ?ROS: no fever, chills, headache.  +allergies. No cough, shortness of breath, CP.  +epigastric pain (mild), n/v/d per HPI.  No bleeding, no rashes.  Vertigo improved after ears popped.  Denies orthostasis. ? ?  ?Observations/Objective: ? ?Temp 98.9 ?F (37.2 ?C) (Oral)   Ht '5\' 5"'$  (1.651 m)   Wt 150 lb (68 kg)   LMP 02/23/1992   BMI 24.96 kg/m?  ? ?Well-appearing pleasant female in no distress. ?She  is alert and oriented.  Cranial nerves grossly intact. ?Normal speech, mood, hygiene and grooming ?No sniffling, throat-clearing or coughing during visit. ?Exam is limited due to virtual nature of the visit. ? ?Rapid COVID test negative ? ? ?Assessment and Plan: ? ?Nausea and vomiting, unspecified vomiting type - suspect related to viral gastroenteritis.  rx for zofran to have on hand in case ongoing n/v - Plan: ondansetron (ZOFRAN-ODT) 4 MG disintegrating tablet, Novel Coronavirus, NAA ([facility]), POC  COVID-19 ? ?Diarrhea, unspecified type - suspect AGE. avoid dairy, BRAT diet reviewed.  s/sx for which she should seek re-eval discussed - Plan: Novel Coronavirus, NAA ([facility]), POC COVID-19 ? ? ?Start Flonase ? ?Stay hydrated ?STOP milk ?Imodium ?BRAT diet, no dairy ? ? ? ?Follow Up Instructions: ? ?  ?I discussed the assessment and treatment plan with the patient. The patient was provided an opportunity to ask questions and all were answered. The patient agreed with the plan and demonstrated an understanding of the instructions. ?  ?The patient was advised to call back or seek an in-person evaluation if the symptoms worsen or if the condition fails to improve as anticipated. ? ?I spent 20 minutes dedicated to the care of this patient, including pre-visit review of records, face to face time, post-visit ordering of testing and documentation. ? ? ? ?Vikki Ports, MD ?

## 2021-05-25 NOTE — Patient Instructions (Signed)
Stay well hydrated (goal is very pale yellow urine). ?Avoid dairy (stop milk/cheese, until a few days after bowels are back to normal). ? ?We discussed bland, BRAT diet (bananas, rice, applesauce, toast). ? ?You may use imodium if needed for diarrhea (follow directions on box, 2 tablets with next bout of diarrhea). ? ?You may use the ondansetron if ongoing nausea/vomiting (in order to stay hydrated and prevent need for ER due to dehydration). ? ?We discussed using Flonase to treat your allergy symptoms.  Remember this can take up to a week to see the full effect of the medication. ? ?Seek re-evaluation if you have persistent high fever, blood in the emesis or stool (or coffee-ground looking stuff in your vomit, red, maroon, or black tarry stools), worsening abdominal pain, dizziness or other concerning symptoms. ? ?Your COVID rapid test was negative.  We discussed this can sometimes be falsely negative in the first 1-2 days of symptoms.  The PCR (which is more accurate early on) should be back tomorrow. ? ?I hope you feel better soon! ? ?

## 2021-05-26 LAB — NOVEL CORONAVIRUS, NAA: SARS-CoV-2, NAA: NOT DETECTED

## 2021-06-03 DIAGNOSIS — R945 Abnormal results of liver function studies: Secondary | ICD-10-CM | POA: Diagnosis not present

## 2021-08-20 ENCOUNTER — Telehealth: Payer: Self-pay

## 2021-08-20 DIAGNOSIS — I4891 Unspecified atrial fibrillation: Secondary | ICD-10-CM

## 2021-08-20 MED ORDER — METOPROLOL TARTRATE 50 MG PO TABS: 50.0000 mg | ORAL_TABLET | Freq: Two times a day (BID) | ORAL | 0 refills | Status: DC

## 2021-08-20 NOTE — Telephone Encounter (Signed)
I have refilled for her to her mail order pharmacy. But she is due for a cpe. Please schedule

## 2021-08-20 NOTE — Telephone Encounter (Signed)
Pt. Called LM wanting to know if she could get a refill on her Metoprolol. Last apt was 05/25/21.

## 2021-08-24 ENCOUNTER — Other Ambulatory Visit: Payer: Self-pay | Admitting: Internal Medicine

## 2021-08-24 DIAGNOSIS — I4891 Unspecified atrial fibrillation: Secondary | ICD-10-CM

## 2021-08-24 MED ORDER — RIVAROXABAN 15 MG PO TABS: 15.0000 mg | ORAL_TABLET | Freq: Every day | ORAL | 0 refills | Status: DC

## 2021-08-24 MED ORDER — METOPROLOL TARTRATE 50 MG PO TABS: 50.0000 mg | ORAL_TABLET | Freq: Two times a day (BID) | ORAL | 0 refills | Status: DC

## 2021-08-24 MED ORDER — RIVAROXABAN 15 MG PO TABS: 15.0000 mg | ORAL_TABLET | Freq: Once | ORAL | 0 refills | Status: DC

## 2021-08-24 NOTE — Telephone Encounter (Signed)
sent 

## 2021-08-31 ENCOUNTER — Other Ambulatory Visit: Payer: Self-pay | Admitting: Internal Medicine

## 2021-08-31 DIAGNOSIS — I4891 Unspecified atrial fibrillation: Secondary | ICD-10-CM

## 2021-09-02 MED ORDER — RIVAROXABAN 15 MG PO TABS: 15.0000 mg | ORAL_TABLET | Freq: Every day | ORAL | 0 refills | Status: DC

## 2021-09-02 NOTE — Addendum Note (Signed)
Addended by: Minette Headland A on: 09/02/2021 08:39 AM   Modules accepted: Orders

## 2021-09-02 NOTE — Telephone Encounter (Signed)
Pt has not received mail order meds yet and needs another 15 pills sent in to local pharmacy

## 2021-09-02 NOTE — Telephone Encounter (Signed)
This was refilled on 7/3

## 2021-09-03 MED ORDER — RIVAROXABAN 15 MG PO TABS: 15.0000 mg | ORAL_TABLET | Freq: Every day | ORAL | 1 refills | Status: DC

## 2021-09-03 MED ORDER — METOPROLOL TARTRATE 50 MG PO TABS: 50.0000 mg | ORAL_TABLET | Freq: Two times a day (BID) | ORAL | 1 refills | Status: DC

## 2021-09-03 NOTE — Addendum Note (Signed)
Addended by: Minette Headland A on: 09/03/2021 09:16 AM   Modules accepted: Orders

## 2021-10-28 ENCOUNTER — Encounter: Payer: Self-pay | Admitting: Internal Medicine

## 2021-12-01 ENCOUNTER — Encounter: Payer: Self-pay | Admitting: Internal Medicine

## 2021-12-11 ENCOUNTER — Other Ambulatory Visit: Payer: Self-pay | Admitting: Medical

## 2021-12-11 ENCOUNTER — Telehealth: Payer: Self-pay | Admitting: Internal Medicine

## 2021-12-11 ENCOUNTER — Ambulatory Visit (INDEPENDENT_AMBULATORY_CARE_PROVIDER_SITE_OTHER): Payer: Medicare Other

## 2021-12-11 VITALS — Ht 66.5 in | Wt 147.0 lb

## 2021-12-11 DIAGNOSIS — E039 Hypothyroidism, unspecified: Secondary | ICD-10-CM

## 2021-12-11 DIAGNOSIS — Z0000 Encounter for general adult medical examination without abnormal findings: Secondary | ICD-10-CM

## 2021-12-11 DIAGNOSIS — I4891 Unspecified atrial fibrillation: Secondary | ICD-10-CM

## 2021-12-11 DIAGNOSIS — E2839 Other primary ovarian failure: Secondary | ICD-10-CM

## 2021-12-11 MED ORDER — VITAMIN D 25 MCG (1000 UNIT) PO TABS: 1000.0000 [IU] | ORAL_TABLET | Freq: Every day | ORAL | 0 refills | Status: DC

## 2021-12-11 MED ORDER — ROSUVASTATIN CALCIUM 10 MG PO TABS: 10.0000 mg | ORAL_TABLET | Freq: Every day | ORAL | 0 refills | Status: DC

## 2021-12-11 MED ORDER — LEVOTHYROXINE SODIUM 100 MCG PO TABS: ORAL_TABLET | ORAL | 0 refills | Status: DC

## 2021-12-11 MED ORDER — METOPROLOL TARTRATE 50 MG PO TABS: 50.0000 mg | ORAL_TABLET | Freq: Two times a day (BID) | ORAL | 0 refills | Status: DC

## 2021-12-11 MED ORDER — RIVAROXABAN 15 MG PO TABS: 15.0000 mg | ORAL_TABLET | Freq: Every day | ORAL | 0 refills | Status: DC

## 2021-12-11 MED ORDER — FOLIC ACID 1 MG PO TABS: 1.0000 mg | ORAL_TABLET | Freq: Every day | ORAL | 0 refills | Status: DC

## 2021-12-11 NOTE — Telephone Encounter (Signed)
Pt needs a refill on all her meds at her appt next week. Just FYI

## 2021-12-11 NOTE — Patient Instructions (Signed)
Rita Wright , Thank you for taking time to come for your Medicare Wellness Visit. I appreciate your ongoing commitment to your health goals. Please review the following plan we discussed and let me know if I can assist you in the future.   Screening recommendations/referrals: Colonoscopy: cologuard 03/06/2021, due 03/06/2024 Mammogram: completed 01/01/2021, due 01/02/2022 Bone Density: completed 03/02/2006 Recommended yearly ophthalmology/optometry visit for glaucoma screening and checkup Recommended yearly dental visit for hygiene and checkup  Vaccinations: Influenza vaccine: due Pneumococcal vaccine: completed 12/05/2017 Tdap vaccine: due Shingles vaccine: discussed   Covid-19: 05/28/2019, 05/13/2019  Advanced directives: Advance directive discussed with you today.   Conditions/risks identified: none  Next appointment: Follow up in one year for your annual wellness visit    Preventive Care 65 Years and Older, Female Preventive care refers to lifestyle choices and visits with your health care provider that can promote health and wellness. What does preventive care include? A yearly physical exam. This is also called an annual well check. Dental exams once or twice a year. Routine eye exams. Ask your health care provider how often you should have your eyes checked. Personal lifestyle choices, including: Daily care of your teeth and gums. Regular physical activity. Eating a healthy diet. Avoiding tobacco and drug use. Limiting alcohol use. Practicing safe sex. Taking low-dose aspirin every day. Taking vitamin and mineral supplements as recommended by your health care provider. What happens during an annual well check? The services and screenings done by your health care provider during your annual well check will depend on your age, overall health, lifestyle risk factors, and family history of disease. Counseling  Your health care provider may ask you questions about your: Alcohol  use. Tobacco use. Drug use. Emotional well-being. Home and relationship well-being. Sexual activity. Eating habits. History of falls. Memory and ability to understand (cognition). Work and work Statistician. Reproductive health. Screening  You may have the following tests or measurements: Height, weight, and BMI. Blood pressure. Lipid and cholesterol levels. These may be checked every 5 years, or more frequently if you are over 83 years old. Skin check. Lung cancer screening. You may have this screening every year starting at age 31 if you have a 30-pack-year history of smoking and currently smoke or have quit within the past 15 years. Fecal occult blood test (FOBT) of the stool. You may have this test every year starting at age 32. Flexible sigmoidoscopy or colonoscopy. You may have a sigmoidoscopy every 5 years or a colonoscopy every 10 years starting at age 33. Hepatitis C blood test. Hepatitis B blood test. Sexually transmitted disease (STD) testing. Diabetes screening. This is done by checking your blood sugar (glucose) after you have not eaten for a while (fasting). You may have this done every 1-3 years. Bone density scan. This is done to screen for osteoporosis. You may have this done starting at age 24. Mammogram. This may be done every 1-2 years. Talk to your health care provider about how often you should have regular mammograms. Talk with your health care provider about your test results, treatment options, and if necessary, the need for more tests. Vaccines  Your health care provider may recommend certain vaccines, such as: Influenza vaccine. This is recommended every year. Tetanus, diphtheria, and acellular pertussis (Tdap, Td) vaccine. You may need a Td booster every 10 years. Zoster vaccine. You may need this after age 73. Pneumococcal 13-valent conjugate (PCV13) vaccine. One dose is recommended after age 95. Pneumococcal polysaccharide (PPSV23) vaccine. One dose is  recommended after age 63. Talk to your health care provider about which screenings and vaccines you need and how often you need them. This information is not intended to replace advice given to you by your health care provider. Make sure you discuss any questions you have with your health care provider. Document Released: 03/07/2015 Document Revised: 10/29/2015 Document Reviewed: 12/10/2014 Elsevier Interactive Patient Education  2017 [place] Prevention in the Home Falls can cause injuries. They can happen to people of all ages. There are many things you can do to make your home safe and to help prevent falls. What can I do on the outside of my home? Regularly fix the edges of walkways and driveways and fix any cracks. Remove anything that might make you trip as you walk through a door, such as a raised step or threshold. Trim any bushes or trees on the path to your home. Use bright outdoor lighting. Clear any walking paths of anything that might make someone trip, such as rocks or tools. Regularly check to see if handrails are loose or broken. Make sure that both sides of any steps have handrails. Any raised decks and porches should have guardrails on the edges. Have any leaves, snow, or ice cleared regularly. Use sand or salt on walking paths during winter. Clean up any spills in your garage right away. This includes oil or grease spills. What can I do in the bathroom? Use night lights. Install grab bars by the toilet and in the tub and shower. Do not use towel bars as grab bars. Use non-skid mats or decals in the tub or shower. If you need to sit down in the shower, use a plastic, non-slip stool. Keep the floor dry. Clean up any water that spills on the floor as soon as it happens. Remove soap buildup in the tub or shower regularly. Attach bath mats securely with double-sided non-slip rug tape. Do not have throw rugs and other things on the floor that can make you  trip. What can I do in the bedroom? Use night lights. Make sure that you have a light by your bed that is easy to reach. Do not use any sheets or blankets that are too big for your bed. They should not hang down onto the floor. Have a firm chair that has side arms. You can use this for support while you get dressed. Do not have throw rugs and other things on the floor that can make you trip. What can I do in the kitchen? Clean up any spills right away. Avoid walking on wet floors. Keep items that you use a lot in easy-to-reach places. If you need to reach something above you, use a strong step stool that has a grab bar. Keep electrical cords out of the way. Do not use floor polish or wax that makes floors slippery. If you must use wax, use non-skid floor wax. Do not have throw rugs and other things on the floor that can make you trip. What can I do with my stairs? Do not leave any items on the stairs. Make sure that there are handrails on both sides of the stairs and use them. Fix handrails that are broken or loose. Make sure that handrails are as long as the stairways. Check any carpeting to make sure that it is firmly attached to the stairs. Fix any carpet that is loose or worn. Avoid having throw rugs at the top or bottom of the stairs. If you  do have throw rugs, attach them to the floor with carpet tape. Make sure that you have a light switch at the top of the stairs and the bottom of the stairs. If you do not have them, ask someone to add them for you. What else can I do to help prevent falls? Wear shoes that: Do not have high heels. Have rubber bottoms. Are comfortable and fit you well. Are closed at the toe. Do not wear sandals. If you use a stepladder: Make sure that it is fully opened. Do not climb a closed stepladder. Make sure that both sides of the stepladder are locked into place. Ask someone to hold it for you, if possible. Clearly mark and make sure that you can  see: Any grab bars or handrails. First and last steps. Where the edge of each step is. Use tools that help you move around (mobility aids) if they are needed. These include: Canes. Walkers. Scooters. Crutches. Turn on the lights when you go into a dark area. Replace any light bulbs as soon as they burn out. Set up your furniture so you have a clear path. Avoid moving your furniture around. If any of your floors are uneven, fix them. If there are any pets around you, be aware of where they are. Review your medicines with your doctor. Some medicines can make you feel dizzy. This can increase your chance of falling. Ask your doctor what other things that you can do to help prevent falls. This information is not intended to replace advice given to you by your health care provider. Make sure you discuss any questions you have with your health care provider. Document Released: 12/05/2008 Document Revised: 07/17/2015 Document Reviewed: 03/15/2014 Elsevier Interactive Patient Education  2017 [employer].

## 2021-12-11 NOTE — Progress Notes (Signed)
I connected with Rita Wright today by telephone and verified that I am speaking with the correct person using two identifiers. Location patient: home Location provider: work Persons participating in the virtual visit: Celes, Dedic LPN.   I discussed the limitations, risks, security and privacy concerns of performing an evaluation and management service by telephone and the availability of in person appointments. I also discussed with the patient that there may be a patient responsible charge related to this service. The patient expressed understanding and verbally consented to this telephonic visit.    Interactive audio and video telecommunications were attempted between this provider and patient, however failed, due to patient having technical difficulties OR patient did not have access to video capability.  We continued and completed visit with audio only.     Vital signs may be patient reported or missing.  Subjective:   Rita Wright is a 71 y.o. female who presents for Medicare Annual (Subsequent) preventive examination.  Review of Systems     Cardiac Risk Factors include: advanced age (>80mn, >>96women)     Objective:    Today's Vitals   12/11/21 1126  Weight: 147 lb (66.7 kg)  Height: 5' 6.5" (1.689 m)   Body mass index is 23.37 kg/m.     12/11/2021   11:31 AM 12/10/2020    9:27 AM 12/07/2018    9:22 AM  Advanced Directives  Does Patient Have a Medical Advance Directive? No No No  Would patient like information on creating a medical advance directive?  Yes (MAU/Ambulatory/Procedural Areas - Information given) No - Patient declined    Current Medications (verified) Outpatient Encounter Medications as of 12/11/2021  Medication Sig   cholecalciferol (VITAMIN D3) 25 MCG (1000 UNIT) tablet Take 1 tablet (1,000 Units total) by mouth daily.   folic acid (FOLVITE) 1 MG tablet Take 1 tablet (1 mg total) by mouth daily.   levothyroxine (SYNTHROID) 100  MCG tablet TAKE 1 TABLET BY MOUTH EVERY DAY BEFORE BREAKFAST   metoprolol tartrate (LOPRESSOR) 50 MG tablet Take 1 tablet (50 mg total) by mouth 2 (two) times daily.   Rivaroxaban (XARELTO) 15 MG TABS tablet Take 1 tablet (15 mg total) by mouth daily.   ondansetron (ZOFRAN-ODT) 4 MG disintegrating tablet Take 1 tablet (4 mg total) by mouth every 8 (eight) hours as needed for nausea or vomiting. (Patient not taking: Reported on 12/11/2021)   Rivaroxaban (XARELTO) 15 MG TABS tablet Take 1 tablet (15 mg total) by mouth daily.   rosuvastatin (CRESTOR) 10 MG tablet Take 1 tablet (10 mg total) by mouth daily. (Patient not taking: Reported on 04/03/2021)   No facility-administered encounter medications on file as of 12/11/2021.    Allergies (verified) Valsartan, Boniva [ibandronic acid], Erythromycin, Fosamax [alendronate], Prednisone, and Penicillins   History: Past Medical History:  Diagnosis Date   Colon cancer screening 09/2014   Cologuard test negative    Depression    [place]. [place]y [place]  Echocardiogram abnormal 05/2014   mild dilation of Left Ventricle; Dr. VWoody Seller  Hashimoto's thyroiditis    History of mammogram 10/10/14   normal   Multinodular goiter    Noncompliance with diagnostic testing    due to severe anxiety   Osteoporosis    Panic disorder    severe, doesn't drive, doesn't use stairs or elevators, severely limited by her anxiety   Renal insufficiency    Thyroid nodule 88/54/62  benign follicular nodule on biopsy  Past Surgical History:  Procedure Laterality Date   COLONOSCOPY  2014   cologuard 2016; refuses colonoscopy   TUBAL LIGATION  1986   Family History  Problem Relation Age of Onset   Hypertension Mother    Parkinson's disease Father    Heart failure Sister    Hypertension Brother    Cerebral aneurysm Sister    Throat cancer Brother    Social History   Socioeconomic History   Marital status: Married    Spouse name: Not on  file   Number of children: 2   Years of education: Not on file   Highest education level: Not on file  Occupational History   Not on file  Tobacco Use   Smoking status: Former    Packs/day: 1.50    Years: 35.00    Total pack years: 52.50    Types: Cigarettes    Quit date: 02/23/1992    Years since quitting: 29.8   Smokeless tobacco: Never  Vaping Use   Vaping Use: Never used  Substance and Sexual Activity   Alcohol use: No   Drug use: No   Sexual activity: Not Currently  Other Topics Concern   Not on file  Social History Narrative   Married.  Exercise with walking her dog, gardening.      Social Determinants of Health   Financial Resource Strain: Low Risk  (12/11/2021)   Overall Financial Resource Strain (CARDIA)    Difficulty of Paying Living Expenses: Not hard at all  Food Insecurity: No Food Insecurity (12/11/2021)   Hunger Vital Sign    Worried About Running Out of Food in the Last Year: Never true    Ran Out of Food in the Last Year: Never true  Transportation Needs: No Transportation Needs (12/11/2021)   PRAPARE - Hydrologist (Medical): No    Lack of Transportation (Non-Medical): No  Physical Activity: Insufficiently Active (12/11/2021)   Exercise Vital Sign    Days of Exercise per Week: 2 days    Minutes of Exercise per Session: 10 min  Stress: No Stress Concern Present (12/11/2021)   [place]    Feeling of Stress : Not at all  Social Connections: Not on file    Tobacco Counseling Counseling given: Not Answered   Clinical Intake:  Pre-visit preparation completed: Yes  Pain : No/denies pain     Nutritional Status: BMI of 19-24  Normal Nutritional Risks: None Diabetes: No  How often do you need to have someone help you when you read instructions, pamphlets, or other written materials from your doctor or pharmacy?: 1 - Never What is the last grade level  you completed in school?: some cosmetology school  Diabetic? no  Interpreter Needed?: No  Information entered by :: NAllen LPN   Activities of Daily Living    12/11/2021   11:33 AM 12/08/2021    5:53 PM  In your present state of health, do you have any difficulty performing the following activities:  Hearing? 0 0  Vision? 0 0  Difficulty concentrating or making decisions? 0 0  Walking or climbing stairs? 0 0  Dressing or bathing? 0 0  Doing errands, shopping? 0 0  Preparing Food and eating ? N N  Using the Toilet? N N  In the past six months, have you accidently leaked urine? Y Y  Do you have problems with loss of bowel control? N N  Managing your Medications?  N N  Managing your Finances? N N  Housekeeping or managing your Housekeeping? N N    Patient Care Team: Tysinger, Camelia Eng, PA-C as PCP - General (Family Medicine) Wilford Corner, MD as Consulting Physician (Gastroenterology)  Indicate any recent Medical Services you may have received from other than [facility] providers in the past year (date may be approximate).     Assessment:   This is a routine wellness examination for Shawnelle.  Hearing/Vision screen Vision Screening - Comments:: Regular eye exams, Dr. Zadie Rhine,   Dietary issues and exercise activities discussed: Current Exercise Habits: Home exercise routine, Type of exercise: walking, Time (Minutes): 10, Frequency (Times/Week): 2, Weekly Exercise (Minutes/Week): 20   Goals Addressed             This Visit's Progress    Patient Stated       12/11/2021, stay alive       Depression Screen    12/11/2021   11:32 AM 12/10/2020    9:27 AM 12/10/2019    8:31 AM 12/07/2018    9:22 AM 12/05/2017   10:12 AM 09/19/2017    1:29 PM 09/01/2016    8:30 AM  PHQ 2/9 Scores  PHQ - 2 Score 0 0 1 1 0 3 0  PHQ- 9 Score 0     7     Fall Risk    12/11/2021   11:31 AM 12/08/2021    5:53 PM 12/10/2020    9:27 AM 12/10/2019    8:30 AM 12/07/2018    9:22 AM   Fall Risk   Falls in the past year? 0 0 0 1 0  Number falls in past yr: 0  0 0   Injury with Fall? 0  0 1   Comment    bruise on right arm   Risk for fall due to : Medication side effect  No Fall Risks No Fall Risks   Follow up Falls prevention discussed;Education provided;Falls evaluation completed  Falls evaluation completed Falls evaluation completed     FALL RISK PREVENTION PERTAINING TO THE HOME:  Any stairs in or around the home? Yes  If so, are there any without handrails? Yes  Home free of loose throw rugs in walkways, pet beds, electrical cords, etc? Yes  Adequate lighting in your home to reduce risk of falls? Yes   ASSISTIVE DEVICES UTILIZED TO PREVENT FALLS:  Life alert? No  Use of a cane, walker or w/c? No  Grab bars in the bathroom? Yes  Shower chair or bench in shower? Yes  Elevated toilet seat or a handicapped toilet? Yes   TIMED UP AND GO:  Was the test performed? No .       Cognitive Function:        12/11/2021   11:33 AM  6CIT Screen  What Year? 0 points  What month? 0 points  What time? 0 points  Count back from 20 0 points  Months in reverse 2 points  Repeat phrase 0 points  Total Score 2 points    Immunizations Immunization History  Administered Date(s) Administered   Fluad Quad(high Dose 65+) 12/10/2019   Influenza Split 12/28/2010   Influenza Whole 11/14/2009   Influenza, High Dose Seasonal PF 12/05/2017   Influenza, Seasonal, Injecte, Preservative Fre 03/03/2012   Influenza,inj,Quad PF,6+ Mos 11/17/2012, 11/22/2013   Influenza-Unspecified 12/02/2015   PFIZER(Purple Top)SARS-COV-2 Vaccination 05/13/2019, 05/28/2019   Pneumococcal Conjugate-13 09/01/2016   Pneumococcal Polysaccharide-23 12/05/2017   Tdap 08/01/2008    TDAP status:  Due, Education has been provided regarding the importance of this vaccine. Advised may receive this vaccine at local pharmacy or Health Dept. Aware to provide a copy of the vaccination record if obtained  from local pharmacy or Health Dept. Verbalized acceptance and understanding.  Flu Vaccine status: Declined, Education has been provided regarding the importance of this vaccine but patient still declined. Advised may receive this vaccine at local pharmacy or Health Dept. Aware to provide a copy of the vaccination record if obtained from local pharmacy or Health Dept. Verbalized acceptance and understanding.  Pneumococcal vaccine status: Up to date  Covid-19 vaccine status: Completed vaccines  Qualifies for Shingles Vaccine? Yes   Zostavax completed No   Shingrix Completed?: No.    Education has been provided regarding the importance of this vaccine. Patient has been advised to call insurance company to determine out of pocket expense if they have not yet received this vaccine. Advised may also receive vaccine at local pharmacy or Health Dept. Verbalized acceptance and understanding.  Screening Tests Health Maintenance  Topic Date Due   Zoster Vaccines- Shingrix (1 of 2) Never done   TETANUS/TDAP  08/02/2018   COVID-19 Vaccine (3 - Pfizer risk series) 06/25/2019   INFLUENZA VACCINE  09/22/2021   COLONOSCOPY (Pts 45-72yr Insurance coverage will need to be confirmed)  04/18/2022   MAMMOGRAM  01/02/2023   Pneumonia Vaccine 71 Years old  Completed   DEXA SCAN  Completed   Hepatitis C Screening  Completed   HPV VACCINES  Aged Out    Health Maintenance  Health Maintenance Due  Topic Date Due   Zoster Vaccines- Shingrix (1 of 2) Never done   TETANUS/TDAP  08/02/2018   COVID-19 Vaccine (3 - Pfizer risk series) 06/25/2019   INFLUENZA VACCINE  09/22/2021    Colorectal cancer screening: Type of screening: Cologuard. Completed 03/06/2021. Repeat every 3 years  Mammogram status: Completed 01/01/2021. Repeat every year  Bone Density status: Completed 03/02/2006.   Lung Cancer Screening: (Low Dose CT Chest recommended if Age 886-80years, 30 pack-year currently smoking OR have quit w/in  15years.) does not qualify.   Lung Cancer Screening Referral: no  Additional Screening:  Hepatitis C Screening: does qualify; Completed 12/07/2018  Vision Screening: Recommended annual ophthalmology exams for early detection of glaucoma and other disorders of the eye. Is the patient up to date with their annual eye exam?  Yes  Who is the provider or what is the name of the office in which the patient attends annual eye exams? Dr. RZadie RhineIf pt is not established with a provider, would they like to be referred to a provider to establish care? No .   Dental Screening: Recommended annual dental exams for proper oral hygiene  Community Resource Referral / Chronic Care Management: CRR required this visit?  No   CCM required this visit?  No      Plan:     I have personally reviewed and noted the following in the patient's chart:   Medical and social history Use of alcohol, tobacco or illicit drugs  Current medications and supplements including opioid prescriptions. Patient is not currently taking opioid prescriptions. Functional ability and status Nutritional status Physical activity Advanced directives List of other physicians Hospitalizations, surgeries, and ER visits in previous 12 months Vitals Screenings to include cognitive, depression, and falls Referrals and appointments  In addition, I have reviewed and discussed with patient certain preventive protocols, quality metrics, and best practice recommendations. A written personalized care plan for  preventive services as well as general preventive health recommendations were provided to patient.     Kellie Simmering, LPN   02/14/4974   Nurse Notes: none  Due to this being a virtual visit, the after visit summary with patients personalized plan was offered to patient via mail or my-chart. Patient would like to access on my-chart

## 2021-12-16 ENCOUNTER — Ambulatory Visit (INDEPENDENT_AMBULATORY_CARE_PROVIDER_SITE_OTHER): Payer: Medicare Other | Admitting: Medical

## 2021-12-16 VITALS — BP 120/70 | HR 91 | Ht 67.5 in | Wt 145.8 lb

## 2021-12-16 DIAGNOSIS — Z780 Asymptomatic menopausal state: Secondary | ICD-10-CM

## 2021-12-16 DIAGNOSIS — E7800 Pure hypercholesterolemia, unspecified: Secondary | ICD-10-CM

## 2021-12-16 DIAGNOSIS — Z7185 Encounter for immunization safety counseling: Secondary | ICD-10-CM | POA: Diagnosis not present

## 2021-12-16 DIAGNOSIS — F411 Generalized anxiety disorder: Secondary | ICD-10-CM

## 2021-12-16 DIAGNOSIS — K219 Gastro-esophageal reflux disease without esophagitis: Secondary | ICD-10-CM | POA: Diagnosis not present

## 2021-12-16 DIAGNOSIS — F410 Panic disorder [episodic paroxysmal anxiety] without agoraphobia: Secondary | ICD-10-CM

## 2021-12-16 DIAGNOSIS — E039 Hypothyroidism, unspecified: Secondary | ICD-10-CM

## 2021-12-16 DIAGNOSIS — M810 Age-related osteoporosis without current pathological fracture: Secondary | ICD-10-CM

## 2021-12-16 DIAGNOSIS — R7301 Impaired fasting glucose: Secondary | ICD-10-CM

## 2021-12-16 DIAGNOSIS — I739 Peripheral vascular disease, unspecified: Secondary | ICD-10-CM | POA: Diagnosis not present

## 2021-12-16 DIAGNOSIS — K4090 Unilateral inguinal hernia, without obstruction or gangrene, not specified as recurrent: Secondary | ICD-10-CM

## 2021-12-16 DIAGNOSIS — Z79899 Other long term (current) drug therapy: Secondary | ICD-10-CM

## 2021-12-16 DIAGNOSIS — I4821 Permanent atrial fibrillation: Secondary | ICD-10-CM

## 2021-12-16 DIAGNOSIS — Z0000 Encounter for general adult medical examination without abnormal findings: Secondary | ICD-10-CM

## 2021-12-16 DIAGNOSIS — Z2820 Immunization not carried out because of patient decision for unspecified reason: Secondary | ICD-10-CM

## 2021-12-16 DIAGNOSIS — T50Z95D Adverse effect of other vaccines and biological substances, subsequent encounter: Secondary | ICD-10-CM

## 2021-12-16 LAB — POCT URINALYSIS DIP (PROADVANTAGE DEVICE)
Bilirubin, UA: NEGATIVE
Blood, UA: NEGATIVE
Glucose, UA: NEGATIVE mg/dL
Ketones, POC UA: NEGATIVE mg/dL
Nitrite, UA: NEGATIVE
Specific Gravity, Urine: 1.015
Urobilinogen, Ur: NEGATIVE
pH, UA: 6 (ref 5.0–8.0)

## 2021-12-16 NOTE — Patient Instructions (Signed)
This visit was a preventative care visit, also known as wellness visit or routine physical.   Topics typically include healthy lifestyle, diet, exercise, preventative care, vaccinations, sick and well care, proper use of emergency dept and after hours care, as well as other concerns.     Recommendations: Continue to return yearly for your annual wellness and preventative care visits.  This gives [place] a chance to discuss healthy lifestyle, exercise, vaccinations, review your chart record, and perform screenings where appropriate.  I recommend you see your eye doctor yearly for routine vision care.  I recommend you see your dentist yearly for routine dental care including hygiene visits twice yearly.   Vaccination recommendations were reviewed Immunization History  Administered Date(s) Administered   Fluad Quad(high Dose 65+) 12/10/2019   Influenza Split 12/28/2010   Influenza Whole 11/14/2009   Influenza, High Dose Seasonal PF 12/05/2017   Influenza, Seasonal, Injecte, Preservative Fre 03/03/2012   Influenza,inj,Quad PF,6+ Mos 11/17/2012, 11/22/2013   Influenza-Unspecified 12/02/2015   PFIZER(Purple Top)SARS-COV-2 Vaccination 05/13/2019, 05/28/2019   Pneumococcal Conjugate-13 09/01/2016   Pneumococcal Polysaccharide-23 12/05/2017   Tdap 08/01/2008    You declined all vaccines today   Screening for cancer: Colon cancer screening: I reviewed your negative cologuard on file that is up to date from 2023  Breast cancer screening: Get your mammogram every 1-2 years.  I reviewd your negative 2022 mammogram.  Skin cancer screening: Check your skin regularly for new changes, growing lesions, or other lesions of concern Come in for evaluation if you have skin lesions of concern.  Lung cancer screening: If you have a greater than 20 pack year history of tobacco use, then you may qualify for lung cancer screening with a chest CT scan.   Please call your insurance company to inquire  about coverage for this test.  We currently don't have screenings for other cancers besides breast, cervical, colon, and lung cancers.  If you have a strong family history of cancer or have other cancer screening concerns, please let me know.    Bone health: Get at least 150 minutes of aerobic exercise weekly Get weight bearing exercise at least once weekly  Osteoporosis per 12/2020 scan.  I recommend you consider Evenity or Prolia medicaions where are injections we can do here at the office to help improve your outlook with bone density.     Heart health: Get at least 150 minutes of aerobic exercise weekly Limit alcohol It is important to maintain a healthy blood pressure and healthy cholesterol numbers  Heart disease screening: Screening for heart disease includes screening for blood pressure, fasting lipids, glucose/diabetes screening, BMI height to weight ratio, reviewed of smoking status, physical activity, and diet.    Goals include blood pressure 120/80 or less, maintaining a healthy lipid/cholesterol profile, preventing diabetes or keeping diabetes numbers under good control, not smoking or using tobacco products, exercising most days per week or at least 150 minutes per week of exercise, and eating healthy variety of fruits and vegetables, healthy oils, and avoiding unhealthy food choices like fried food, fast food, high sugar and high cholesterol foods.    Follow up with cardiology in 03/2022 as planned.  I reviewed 03/2021 cardioloy notes.   Medical care options: I recommend you continue to seek care here first for routine care.  We try really hard to have available appointments Monday through Friday daytime hours for sick visits, acute visits, and physicals.  Urgent care should be used for after hours and weekends for significant  issues that cannot wait till the next day.  The emergency department should be used for significant potentially life-threatening emergencies.  The  emergency department is expensive, can often have long wait times for less significant concerns, so try to utilize primary care, urgent care, or telemedicine when possible to avoid unnecessary trips to the emergency department.  Virtual visits and telemedicine have been introduced since the pandemic started in 2020, and can be convenient ways to receive medical care.  We offer virtual appointments as well to assist you in a variety of options to seek medical care.   Advanced Directives: I recommend you consider completing a [place] and Living Will.   These documents respect your wishes and help alleviate burdens on your loved ones if you were to become terminally ill or be in a position to need those documents enforced.    You can complete Advanced Directives yourself, have them notarized, then have copies made for our office, for you and for anybody you feel should have them in safe keeping.  Or, you can have an attorney prepare these documents.   If you haven't updated your Last Will and Testament in a while, it may be worthwhile having an attorney prepare these documents together and save on some costs.

## 2021-12-16 NOTE — Progress Notes (Signed)
Subjective:   HPI  Rita Wright is a 71 y.o. female who presents for Chief Complaint  Patient presents with   med check plus    Med check plus- AWV done 12/11/21, no concerns, fasting-     Patient Care Team: Kahdijah Errickson, Camelia Eng, PA-C as PCP - General (Family Medicine) Wilford Corner, MD as Consulting Physician (Gastroenterology) Zadie Rhine Clent Demark, MD as Consulting Physician (Ophthalmology) Adrian Prows, MD as Consulting Physician (Cardiology) Clemon Chambers, MD as Consulting Physician (Allergy and Immunology) Sees dentist Sees eye doctor  Concerns: Doing ok.  No particular issues  Osteoporosis - didn't tolerate boniva and fosamax  After reaction to flu shot and prior vaccines, saw allergist in past year.   She declines any other vaccines due to fear of allergic reaction  She thinks she may have hernia left inguinal region.  Occasional discomfort.  No bulge.  Compliant with medicaiton    Past Medical History:  Diagnosis Date   Colon cancer screening 09/2014   Cologuard test negative    Depression    Dilated cardiomyopathy ([place]) 05/2014   Dr. Woody Seller   Echocardiogram abnormal 05/2014   mild dilation of Left Ventricle; Dr. Woody Seller   Hashimoto's thyroiditis    History of mammogram 10/10/14   normal   Multinodular goiter    Noncompliance with diagnostic testing    due to severe anxiety   Osteoporosis    Panic disorder    severe, doesn't drive, doesn't use stairs or elevators, severely limited by her anxiety   Renal insufficiency    Thyroid nodule 4/73/40   benign follicular nodule on biopsy    Family History  Problem Relation Age of Onset   Hypertension Mother    Parkinson's disease Father    Heart failure Sister    Hypertension Brother    Cerebral aneurysm Sister    Throat cancer Brother      Current Outpatient Medications:    cholecalciferol (VITAMIN D3) 25 MCG (1000 UNIT) tablet, Take 1 tablet (1,000 Units total) by mouth daily., Disp: 90 tablet, Rfl: 0   folic  acid (FOLVITE) 1 MG tablet, Take 1 tablet (1 mg total) by mouth daily., Disp: 90 tablet, Rfl: 0   levothyroxine (SYNTHROID) 100 MCG tablet, TAKE 1 TABLET BY MOUTH EVERY DAY BEFORE BREAKFAST, Disp: 90 tablet, Rfl: 0   metoprolol tartrate (LOPRESSOR) 50 MG tablet, Take 1 tablet (50 mg total) by mouth 2 (two) times daily., Disp: 180 tablet, Rfl: 0   Rivaroxaban (XARELTO) 15 MG TABS tablet, Take 1 tablet (15 mg total) by mouth daily., Disp: 90 tablet, Rfl: 0  Allergies  Allergen Reactions   Valsartan Cough   Boniva [Ibandronic Acid]     Horrible joint pains   Erythromycin     GI upset   Fosamax [Alendronate]     Arthralgias, myalgias    Prednisone Other (See Comments)    Jittery, insomnia, intolerance   Penicillins Hives, Rash and Other (See Comments)    Felt like she was going to pass out.    Reviewed their medical, surgical, family, social, medication, and allergy history and updated chart as appropriate.   Review of Systems Constitutional: -fever, -chills, -sweats, -unexpected weight change, -decreased appetite, -fatigue Allergy: -sneezing, -itching, -congestion Dermatology: -changing moles, --rash, -lumps ENT: -runny nose, -ear pain, -sore throat, -hoarseness, -sinus pain, -teeth pain, - ringing in ears, -hearing loss, -nosebleeds Cardiology: -chest pain, -palpitations, -swelling, -difficulty breathing when lying flat, -waking up short of breath Respiratory: -cough, -shortness of breath, -  difficulty breathing with exercise or exertion, -wheezing, -coughing up blood Gastroenterology: -abdominal pain, -nausea, -vomiting, -diarrhea, -constipation, -blood in stool, -changes in bowel movement, -difficulty swallowing or eating Hematology: -bleeding, -bruising  Musculoskeletal: -joint aches, -muscle aches, -joint swelling, -back pain, -neck pain, -cramping, -changes in gait Ophthalmology: denies vision changes, eye redness, itching, discharge Urology: -burning with urination, -difficulty  urinating, -blood in urine, -urinary frequency, -urgency, -incontinence Neurology: -headache, -weakness, -tingling, -numbness, -memory loss, -falls, -dizziness Psychology: -depressed mood, -agitation, -sleep problems Breast/gyn: -breast tendnerss, -discharge, -lumps, -vaginal discharge,- irregular periods, -heavy periods      12/11/2021   11:32 AM 12/10/2020    9:27 AM 12/10/2019    8:31 AM 12/07/2018    9:22 AM 12/05/2017   10:12 AM  Depression screen PHQ 2/9  Decreased Interest 0 0 0 0 0  Down, Depressed, Hopeless 0 0 1 1 0  PHQ - 2 Score 0 0 1 1 0  Altered sleeping 0      Tired, decreased energy 0      Change in appetite 0      Feeling bad or failure about yourself  0      Trouble concentrating 0      Moving slowly or fidgety/restless 0      Suicidal thoughts 0      PHQ-9 Score 0      Difficult doing work/chores Not difficult at all           Objective:  BP 120/70   Pulse 91   Ht 5' 7.5" (1.715 m)   Wt 145 lb 12.8 oz (66.1 kg)   LMP 02/23/1992   BMI 22.50 kg/m   General appearance: alert, no distress, WD/WN, Caucasian female Skin: unremarkable HEENT: normocephalic, conjunctiva/corneas normal, sclerae anicteric, PERRLA, EOMi, nares patent, no discharge or erythema, pharynx normal Oral cavity: MMM, tongue normal, teeth normal Neck: supple, no lymphadenopathy, no thyromegaly, no masses, normal ROM, no bruits Chest: non tender, normal shape and expansion Heart: RRR, normal S1, S2, no murmurs Lungs: CTA bilaterally, no wheezes, rhonchi, or rales Abdomen: +bs, soft, non tender, non distended, no masses, no hepatomegaly, no splenomegaly, no bruits Back: non tender, normal ROM, no scoliosis Musculoskeletal: upper extremities non tender, no obvious deformity, normal ROM throughout, lower extremities non tender, no obvious deformity, normal ROM throughout Extremities: no edema, no cyanosis, no clubbing Pulses: 2+ symmetric, upper and lower extremities, normal cap  refill Neurological: alert, oriented x 3, CN2-12 intact, strength normal upper extremities and lower extremities, sensation normal throughout, DTRs 2+ throughout, no cerebellar signs, gait normal Psychiatric: normal affect, behavior normal, pleasant  Breast/gyn/rectal - declined    Assessment and Plan :   Encounter Diagnoses  Name Primary?   Permanent atrial fibrillation (HCC) Yes   Encounter for health maintenance examination in adult    Vaccine counseling    Generalized anxiety disorder    Gastroesophageal reflux disease without esophagitis    High risk medication use    Hypercholesteremia    Hypothyroidism, unspecified type    Impaired fasting blood sugar    Osteoporosis, unspecified osteoporosis type, unspecified pathological fracture presence    PAD (peripheral artery disease) (HCC)    Panic disorder    Post-menopausal    Vaccine reaction, subsequent encounter    Vaccine refused by patient    Non-recurrent unilateral inguinal hernia without obstruction or gangrene      This visit was a preventative care visit, also known as wellness visit or routine physical.   Topics typically include  healthy lifestyle, diet, exercise, preventative care, vaccinations, sick and well care, proper use of emergency dept and after hours care, as well as other concerns.     Recommendations: Continue to return yearly for your annual wellness and preventative care visits.  This gives [place] a chance to discuss healthy lifestyle, exercise, vaccinations, review your chart record, and perform screenings where appropriate.  I recommend you see your eye doctor yearly for routine vision care.  I recommend you see your dentist yearly for routine dental care including hygiene visits twice yearly.   Vaccination recommendations were reviewed Immunization History  Administered Date(s) Administered   Fluad Quad(high Dose 65+) 12/10/2019   Influenza Split 12/28/2010   Influenza Whole 11/14/2009    Influenza, High Dose Seasonal PF 12/05/2017   Influenza, Seasonal, Injecte, Preservative Fre 03/03/2012   Influenza,inj,Quad PF,6+ Mos 11/17/2012, 11/22/2013   Influenza-Unspecified 12/02/2015   PFIZER(Purple Top)SARS-COV-2 Vaccination 05/13/2019, 05/28/2019   Pneumococcal Conjugate-13 09/01/2016   Pneumococcal Polysaccharide-23 12/05/2017   Tdap 08/01/2008    You declined all vaccines today   Screening for cancer: Colon cancer screening: I reviewed your negative Cologuard on file that is up to date from 2023  Breast cancer screening: Get your mammogram every 1-2 years.  I reviewed your negative 2022 mammogram.  Skin cancer screening: Check your skin regularly for new changes, growing lesions, or other lesions of concern Come in for evaluation if you have skin lesions of concern.  Lung cancer screening: If you have a greater than 20 pack year history of tobacco use, then you may qualify for lung cancer screening with a chest CT scan.   Please call your insurance company to inquire about coverage for this test.  We currently don't have screenings for other cancers besides breast, cervical, colon, and lung cancers.  If you have a strong family history of cancer or have other cancer screening concerns, please let me know.    Bone health: Get at least 150 minutes of aerobic exercise weekly Get weight bearing exercise at least once weekly  Osteoporosis per 12/2020 scan.  I recommend you consider Evenity or Prolia medications where are injections we can do here at the office to help improve your outlook with bone density.     Heart health: Get at least 150 minutes of aerobic exercise weekly Limit alcohol It is important to maintain a healthy blood pressure and healthy cholesterol numbers  Heart disease screening: Screening for heart disease includes screening for blood pressure, fasting lipids, glucose/diabetes screening, BMI height to weight ratio, reviewed of smoking status,  physical activity, and diet.    Goals include blood pressure 120/80 or less, maintaining a healthy lipid/cholesterol profile, preventing diabetes or keeping diabetes numbers under good control, not smoking or using tobacco products, exercising most days per week or at least 150 minutes per week of exercise, and eating healthy variety of fruits and vegetables, healthy oils, and avoiding unhealthy food choices like fried food, fast food, high sugar and high cholesterol foods.    Follow up with cardiology in 03/2022 as planned.  I reviewed 03/2021 cardiology notes.   Medical care options: I recommend you continue to seek care here first for routine care.  We try really hard to have available appointments Monday through Friday daytime hours for sick visits, acute visits, and physicals.  Urgent care should be used for after hours and weekends for significant issues that cannot wait till the next day.  The emergency department should be used for significant potentially life-threatening emergencies.  The emergency department is expensive, can often have long wait times for less significant concerns, so try to utilize primary care, urgent care, or telemedicine when possible to avoid unnecessary trips to the emergency department.  Virtual visits and telemedicine have been introduced since the pandemic started in 2020, and can be convenient ways to receive medical care.  We offer virtual appointments as well to assist you in a variety of options to seek medical care.   Advanced Directives: I recommend you consider completing a [place] and Living Will.   These documents respect your wishes and help alleviate burdens on your loved ones if you were to become terminally ill or be in a position to need those documents enforced.    You can complete Advanced Directives yourself, have them notarized, then have copies made for our office, for you and for anybody you feel should have them in safe  keeping.  Or, you can have an attorney prepare these documents.   If you haven't updated your Last Will and Testament in a while, it may be worthwhile having an attorney prepare these documents together and save on some costs.      We discussed this in depth today.  She says she and her husband has written down their wishes but no legal documents have been formed.  I encouraged her to work on this and get legal documents signed and notarized for we will and advanced directives    Separate significant issues discussed: Hypothyroidism-continue current medication, routine labs today  Hyperlipidemia-she has declined statins in the past.  Updated labs today and consider other recommendations  Peripheral arterial disease-continue walking and regular exercise.  Consider statin or other lipid-lowering medication  GERD-no recent concerns  Osteoporosis-we discussed treatment including weightbearing exercise, aerobic exercise, calcium vitamin D supplementation.  Recheck labs today for vitamin D.  She is currently using 1000 units daily.  We discussed considering Prolia or Evenity.  She did not tolerate Boniva or Fosamax and is not willing to do yearly Reclast or other options we discussed  Generalized anxiety disorder-she avoids elevators, escalators, she will not use a building this greater than 1 floor.  She has declined counseling in the past.  Not on any medication in particular for this  Impaired glucose-updated labs today  Permanent A-fib-sees cardiology, reviewed 2023 office notes.  Echocardiogram reviewed from January 2022, ABI reviewed from 10/2018.  Consider updated ABI screening.  Follow-up with cardiology as planned.  Continue current medication including metoprolol and Xarelto.  Left inguinal hernia-we discussed the findings and potential surgery down the road.  Not a significant issue at this time.    Shir was seen today for med check plus.  Diagnoses and all orders for this  visit:  Permanent atrial fibrillation ([place]) -     Comprehensive metabolic panel -     CBC -     POCT Urinalysis DIP (Proadvantage Device)  Encounter for health maintenance examination in adult  Vaccine counseling  Generalized anxiety disorder  Gastroesophageal reflux disease without esophagitis  High risk medication use  Hypercholesteremia -     Lipid panel  Hypothyroidism, unspecified type -     TSH + free T4  Impaired fasting blood sugar -     Hemoglobin A1c  Osteoporosis, unspecified osteoporosis type, unspecified pathological fracture presence -     VITAMIN D 25 Hydroxy (Vit-D Deficiency, Fractures)  PAD (peripheral artery disease) (HCC)  Panic disorder  Post-menopausal -     VITAMIN  D 25 Hydroxy (Vit-D Deficiency, Fractures)  Vaccine reaction, subsequent encounter  Vaccine refused by patient  Non-recurrent unilateral inguinal hernia without obstruction or gangrene  Spent > 45 minutes face to face with patient in discussion of symptoms, evaluation, plan and recommendations.     Follow-up pending labs, yearly for physical

## 2021-12-17 LAB — COMPREHENSIVE METABOLIC PANEL
ALT: 46 IU/L — ABNORMAL HIGH (ref 0–32)
AST: 36 IU/L (ref 0–40)
Albumin/Globulin Ratio: 1.4 (ref 1.2–2.2)
Albumin: 3.9 g/dL (ref 3.8–4.8)
Alkaline Phosphatase: 91 IU/L (ref 44–121)
BUN/Creatinine Ratio: 45 — ABNORMAL HIGH (ref 12–28)
BUN: 40 mg/dL — ABNORMAL HIGH (ref 8–27)
Bilirubin Total: 0.6 mg/dL (ref 0.0–1.2)
CO2: 25 mmol/L (ref 20–29)
Calcium: 9.4 mg/dL (ref 8.7–10.3)
Chloride: 101 mmol/L (ref 96–106)
Creatinine, Ser: 0.88 mg/dL (ref 0.57–1.00)
Globulin, Total: 2.7 g/dL (ref 1.5–4.5)
Glucose: 96 mg/dL (ref 70–99)
Potassium: 4.6 mmol/L (ref 3.5–5.2)
Sodium: 140 mmol/L (ref 134–144)
Total Protein: 6.6 g/dL (ref 6.0–8.5)
eGFR: 70 mL/min/{1.73_m2} (ref >59)

## 2021-12-17 LAB — CBC
Hematocrit: 48.3 % — ABNORMAL HIGH (ref 34.0–46.6)
Hemoglobin: 16.2 g/dL — ABNORMAL HIGH (ref 11.1–15.9)
MCH: 31.6 pg (ref 26.6–33.0)
MCHC: 33.5 g/dL (ref 31.5–35.7)
MCV: 94 fL (ref 79–97)
Platelets: 154 10*3/uL (ref 150–450)
RBC: 5.13 x10E6/uL (ref 3.77–5.28)
RDW: 12 % (ref 11.7–15.4)
WBC: 5.3 10*3/uL (ref 3.4–10.8)

## 2021-12-17 LAB — HEMOGLOBIN A1C
Est. average glucose Bld gHb Est-mCnc: 117 mg/dL
Hgb A1c MFr Bld: 5.7 % — ABNORMAL HIGH (ref 4.8–5.6)

## 2021-12-17 LAB — TSH+FREE T4
Free T4: 1.46 ng/dL (ref 0.82–1.77)
TSH: 3.21 u[IU]/mL (ref 0.450–4.500)

## 2021-12-17 LAB — LIPID PANEL
Chol/HDL Ratio: 2.4 ratio (ref 0.0–4.4)
Cholesterol, Total: 218 mg/dL — ABNORMAL HIGH (ref 100–199)
HDL: 89 mg/dL (ref >39)
LDL Chol Calc (NIH): 117 mg/dL — ABNORMAL HIGH (ref 0–99)
Triglycerides: 68 mg/dL (ref 0–149)
VLDL Cholesterol Cal: 12 mg/dL (ref 5–40)

## 2021-12-17 LAB — VITAMIN D 25 HYDROXY (VIT D DEFICIENCY, FRACTURES): Vit D, 25-Hydroxy: 44.5 ng/mL (ref 30.0–100.0)

## 2021-12-18 ENCOUNTER — Other Ambulatory Visit: Payer: Self-pay | Admitting: Medical

## 2021-12-18 DIAGNOSIS — R7989 Other specified abnormal findings of blood chemistry: Secondary | ICD-10-CM

## 2021-12-18 DIAGNOSIS — D751 Secondary polycythemia: Secondary | ICD-10-CM

## 2021-12-18 MED ORDER — ROSUVASTATIN CALCIUM 5 MG PO TABS: 5.0000 mg | ORAL_TABLET | Freq: Every day | ORAL | 3 refills | Status: DC

## 2021-12-21 ENCOUNTER — Telehealth: Payer: Self-pay | Admitting: Hematology and Oncology

## 2021-12-21 NOTE — Telephone Encounter (Signed)
Scheduled appt per 10/27 referral. Pt is aware of appt date and time. Pt is aware to arrive 15 mins prior to appt time and to bring and updated insurance card. Pt is aware of appt location.   

## 2022-01-04 ENCOUNTER — Inpatient Hospital Stay: Payer: Medicare Other | Admitting: Hematology and Oncology

## 2022-01-12 ENCOUNTER — Inpatient Hospital Stay: Payer: Medicare Other

## 2022-01-12 ENCOUNTER — Inpatient Hospital Stay: Payer: Medicare Other | Attending: Hematology and Oncology | Admitting: Hematology and Oncology

## 2022-01-12 ENCOUNTER — Encounter: Payer: Self-pay | Admitting: Hematology and Oncology

## 2022-01-12 VITALS — BP 122/91 | HR 80 | Temp 98.0°F | Resp 18 | Ht 67.0 in | Wt 145.5 lb

## 2022-01-12 DIAGNOSIS — Z79899 Other long term (current) drug therapy: Secondary | ICD-10-CM | POA: Insufficient documentation

## 2022-01-12 DIAGNOSIS — Z87891 Personal history of nicotine dependence: Secondary | ICD-10-CM | POA: Insufficient documentation

## 2022-01-12 DIAGNOSIS — D751 Secondary polycythemia: Secondary | ICD-10-CM

## 2022-01-12 DIAGNOSIS — Z7901 Long term (current) use of anticoagulants: Secondary | ICD-10-CM | POA: Insufficient documentation

## 2022-01-12 DIAGNOSIS — I4891 Unspecified atrial fibrillation: Secondary | ICD-10-CM | POA: Diagnosis not present

## 2022-01-12 DIAGNOSIS — R0902 Hypoxemia: Secondary | ICD-10-CM | POA: Insufficient documentation

## 2022-01-12 LAB — COMPREHENSIVE METABOLIC PANEL
ALT: 60 U/L — ABNORMAL HIGH (ref 0–44)
AST: 43 U/L — ABNORMAL HIGH (ref 15–41)
Albumin: 4.1 g/dL (ref 3.5–5.0)
Alkaline Phosphatase: 76 U/L (ref 38–126)
Anion gap: 4 — ABNORMAL LOW (ref 5–15)
BUN: 42 mg/dL — ABNORMAL HIGH (ref 8–23)
CO2: 30 mmol/L (ref 22–32)
Calcium: 10 mg/dL (ref 8.9–10.3)
Chloride: 104 mmol/L (ref 98–111)
Creatinine, Ser: 1.13 mg/dL — ABNORMAL HIGH (ref 0.44–1.00)
GFR, Estimated: 52 mL/min — ABNORMAL LOW (ref >60)
Glucose, Bld: 92 mg/dL (ref 70–99)
Potassium: 4.5 mmol/L (ref 3.5–5.1)
Sodium: 138 mmol/L (ref 135–145)
Total Bilirubin: 0.8 mg/dL (ref 0.3–1.2)
Total Protein: 7.1 g/dL (ref 6.5–8.1)

## 2022-01-12 LAB — CBC WITH DIFFERENTIAL/PLATELET
Abs Immature Granulocytes: 0.01 10*3/uL (ref 0.00–0.07)
Basophils Absolute: 0 10*3/uL (ref 0.0–0.1)
Basophils Relative: 1 %
Eosinophils Absolute: 0.2 10*3/uL (ref 0.0–0.5)
Eosinophils Relative: 3 %
HCT: 48 % — ABNORMAL HIGH (ref 36.0–46.0)
Hemoglobin: 16.3 g/dL — ABNORMAL HIGH (ref 12.0–15.0)
Immature Granulocytes: 0 %
Lymphocytes Relative: 16 %
Lymphs Abs: 1.1 10*3/uL (ref 0.7–4.0)
MCH: 32.2 pg (ref 26.0–34.0)
MCHC: 34 g/dL (ref 30.0–36.0)
MCV: 94.9 fL (ref 80.0–100.0)
Monocytes Absolute: 0.4 10*3/uL (ref 0.1–1.0)
Monocytes Relative: 6 %
Neutro Abs: 5 10*3/uL (ref 1.7–7.7)
Neutrophils Relative %: 74 %
Platelets: 143 10*3/uL — ABNORMAL LOW (ref 150–400)
RBC: 5.06 MIL/uL (ref 3.87–5.11)
RDW: 12.8 % (ref 11.5–15.5)
Smear Review: NORMAL
WBC: 6.7 10*3/uL (ref 4.0–10.5)
nRBC: 0 % (ref 0.0–0.2)

## 2022-01-12 NOTE — Progress Notes (Signed)
[facility] CONSULT NOTE  Patient Care Team: Tysinger, Camelia Eng, PA-C as PCP - General (Family Medicine) Wilford Corner, MD as Consulting Physician (Gastroenterology) Zadie Rhine Clent Demark, MD as Consulting Physician (Ophthalmology) Adrian Prows, MD as Consulting Physician (Cardiology) Clemon Chambers, MD as Consulting Physician (Allergy and Immunology)  CHIEF COMPLAINTS/PURPOSE OF CONSULTATION:  Polycythemia  ASSESSMENT & PLAN:  Polycythemia, secondary  This is a very pleasant 71 yr old female patient with PMH of atrial fibrillation, cardiomyopathy referred to hematology for polycythemia.  I discussed with the patient extensively the differential diagnosis of polycythemia 1. Primary polycythemia due to clonal stem cell abnormality 2. Secondary polycythemia due to cause that include hypoxia, heart or lung problems, altitude, athletics, erythropoietin producing lesion/tumors etc  Recommendation:  1. JAK-2 mutation testing to evaluate polycythemia vera 2. Erythropoietin level 3. Drink 8 glasses of water per day  Indications for phlebotomy  1. Primary polycythemia with hematocrit over 45 2. Secondary polycythemia with severe symptoms which include strokelike symptoms, severe recurrent headaches, severe fatigue or if HCT is greater than 54.  If the JAK-2 mutation is normal and erythropoietin level is normal, a bone marrow biopsy may be considered. If the erythropoietin is elevated, she will need ultrasound of the liver and kidney for further evaluation.  Since this is a chronic finding, I have reviewed her labs for the past 4 years and she has always had mild polycythemia with hemoglobin around 16 g/dL, I do not believe there is primary polycythemia but I agree with the above-mentioned investigation.  Have also encouraged her to consider sleep apnea testing.  There is no indication for phlebotomy at, there is no evidence of hyperviscosity symptoms.  I will call her back in 2 weeks  to review lab results and to discuss additional recommendations.  I provided patient written information on polycythemia.    Orders Placed This Encounter  Procedures   CBC with Differential/Platelet    Standing Status:   Standing    Number of Occurrences:   22    Standing Expiration Date:   01/13/2023   JAK2 (INCLUDING V617F AND EXON 12), MPL,& CALR W/RFL MPN PANEL (NGS)   Erythropoietin    Standing Status:   Future    Number of Occurrences:   1    Standing Expiration Date:   01/12/2023   Comprehensive metabolic panel    Standing Status:   Standing    Number of Occurrences:   33    Standing Expiration Date:   01/13/2023     HISTORY OF PRESENTING ILLNESS:  Rita Wright 71 y.o. female is here because of polycythemia.  This is a very pleasant 71 year old female patient with past medical history significant for A-fib, dilated cardiomyopathy followed by cardiology who was referred for evaluation of polycythemia.  Patient tells me that she has not noticed any new complaints.  She does not have a known history of obstructive sleep apnea but does admit to some snoring at times. She also has underlying A-fib, resulting fatigue and appears to have low ejection fraction, followed by cardiology.   She denies any underlying lung issues.  She denies any erythromelalgia, intractable pruritus, history of DVT/PE.  Rest of the pertinent 10 point ROS reviewed and negative.  REVIEW OF SYSTEMS:   Constitutional: Denies fevers, chills or abnormal night sweats Eyes: Denies blurriness of vision, double vision or watery eyes Ears, nose, mouth, throat, and face: Denies mucositis or sore throat Respiratory: Denies cough, dyspnea or wheezes Cardiovascular: Denies palpitation,  chest discomfort or lower extremity swelling Gastrointestinal:  Denies nausea, heartburn or change in bowel habits Skin: Denies abnormal skin rashes Lymphatics: Denies new lymphadenopathy or easy bruising Neurological:Denies  numbness, tingling or new weaknesses Behavioral/Psych: Mood is stable, no new changes  All other systems were reviewed with the patient and are negative.  MEDICAL HISTORY:  Past Medical History:  Diagnosis Date   Colon cancer screening 09/2014   Cologuard test negative    Depression    Dilated cardiomyopathy ([place]) 05/2014   Dr. Woody Seller   Echocardiogram abnormal 05/2014   mild dilation of Left Ventricle; Dr. Woody Seller   Hashimoto's thyroiditis    History of mammogram 10/10/14   normal   Multinodular goiter    Noncompliance with diagnostic testing    due to severe anxiety   Osteoporosis    Panic disorder    severe, doesn't drive, doesn't use stairs or elevators, severely limited by her anxiety   Renal insufficiency    Thyroid nodule 6/71/24   benign follicular nodule on biopsy    SURGICAL HISTORY: Past Surgical History:  Procedure Laterality Date   COLONOSCOPY  2014   cologuard 2016; refuses colonoscopy   TUBAL LIGATION  1986    SOCIAL HISTORY: Social History   Socioeconomic History   Marital status: Married    Spouse name: Not on file   Number of children: 2   Years of education: Not on file   Highest education level: Not on file  Occupational History   Not on file  Tobacco Use   Smoking status: Former    Packs/day: 1.50    Years: 35.00    Total pack years: 52.50    Types: Cigarettes    Quit date: 02/23/1992    Years since quitting: 29.9   Smokeless tobacco: Never  Vaping Use   Vaping Use: Never used  Substance and Sexual Activity   Alcohol use: No   Drug use: No   Sexual activity: Not Currently  Other Topics Concern   Not on file  Social History Narrative   Married.  Exercise with walking her dog, gardening.      Social Determinants of Health   Financial Resource Strain: Low Risk  (12/11/2021)   Overall Financial Resource Strain (CARDIA)    Difficulty of Paying Living Expenses: Not hard at all  Food Insecurity: No Food Insecurity (12/11/2021)   Hunger Vital  Sign    Worried About Running Out of Food in the Last Year: Never true    Ran Out of Food in the Last Year: Never true  Transportation Needs: No Transportation Needs (12/11/2021)   PRAPARE - Hydrologist (Medical): No    Lack of Transportation (Non-Medical): No  Physical Activity: Insufficiently Active (12/11/2021)   Exercise Vital Sign    Days of Exercise per Week: 2 days    Minutes of Exercise per Session: 10 min  Stress: No Stress Concern Present (12/11/2021)   [place]    Feeling of Stress : Not at all  Social Connections: Not on file  Intimate Partner Violence: Not on file    FAMILY HISTORY: Family History  Problem Relation Age of Onset   Hypertension Mother    Parkinson's disease Father    Heart failure Sister    Hypertension Brother    Cerebral aneurysm Sister    Throat cancer Brother     ALLERGIES:  is allergic to valsartan, boniva [ibandronic acid], erythromycin, fosamax [  alendronate], prednisone, and penicillins.  MEDICATIONS:  Current Outpatient Medications  Medication Sig Dispense Refill   cholecalciferol (VITAMIN D3) 25 MCG (1000 UNIT) tablet Take 1 tablet (1,000 Units total) by mouth daily. 90 tablet 0   folic acid (FOLVITE) 1 MG tablet Take 1 tablet (1 mg total) by mouth daily. 90 tablet 0   levothyroxine (SYNTHROID) 100 MCG tablet TAKE 1 TABLET BY MOUTH EVERY DAY BEFORE BREAKFAST 90 tablet 0   metoprolol tartrate (LOPRESSOR) 50 MG tablet Take 1 tablet (50 mg total) by mouth 2 (two) times daily. 180 tablet 0   Rivaroxaban (XARELTO) 15 MG TABS tablet Take 1 tablet (15 mg total) by mouth daily. 90 tablet 0   rosuvastatin (CRESTOR) 5 MG tablet Take 1 tablet (5 mg total) by mouth daily. 90 tablet 3   No current facility-administered medications for this visit.     PHYSICAL EXAMINATION: ECOG PERFORMANCE STATUS: 0 - Asymptomatic  Vitals:   01/12/22 1012  BP: (!)  122/91  Pulse: 80  Resp: 18  Temp: 98 F (36.7 C)  SpO2: 98%   Filed Weights   01/12/22 1012  Weight: 145 lb 8 oz (66 kg)    GENERAL:alert, no distress and comfortable SKIN: skin color, texture, turgor are normal, no rashes or significant lesions EYES: normal, conjunctiva are pink and non-injected, sclera clear OROPHARYNX:no exudate, no erythema and lips, buccal mucosa, and tongue normal  NECK: supple, thyroid normal size, non-tender, without nodularity LYMPH:  no palpable lymphadenopathy in the cervical, axillary, or however you do have LUNGS: clear to auscultation and percussion with normal breathing effort HEART: regular rate & rhythm and no murmurs and no lower extremity edema ABDOMEN:abdomen soft, non-tender and normal bowel sounds Musculoskeletal:no cyanosis of digits and no clubbing  PSYCH: alert & oriented x 3 with fluent speech NEURO: no focal motor/sensory deficits  LABORATORY DATA:  I have reviewed the data as listed Lab Results  Component Value Date   WBC 6.7 01/12/2022   HGB 16.3 (H) 01/12/2022   HCT 48.0 (H) 01/12/2022   MCV 94.9 01/12/2022   PLT 143 (L) 01/12/2022     Chemistry      Component Value Date/Time   NA 138 01/12/2022 1128   NA 140 12/16/2021 1030   K 4.5 01/12/2022 1128   CL 104 01/12/2022 1128   CO2 30 01/12/2022 1128   BUN 42 (H) 01/12/2022 1128   BUN 40 (H) 12/16/2021 1030   CREATININE 1.13 (H) 01/12/2022 1128   CREATININE 0.87 09/01/2016 0818      Component Value Date/Time   CALCIUM 10.0 01/12/2022 1128   ALKPHOS 76 01/12/2022 1128   AST 43 (H) 01/12/2022 1128   ALT 60 (H) 01/12/2022 1128   BILITOT 0.8 01/12/2022 1128   BILITOT 0.6 12/16/2021 1030       RADIOGRAPHIC STUDIES: I have personally reviewed the radiological images as listed and agreed with the findings in the report. No results found.  All questions were answered. The patient knows to call the clinic with any problems, questions or concerns. I spent 30 minutes in  the care of this patient including H and P, review of records, counseling and coordination of care.     Benay Pike, MD 01/13/2022 10:44 AM

## 2022-01-13 DIAGNOSIS — D751 Secondary polycythemia: Secondary | ICD-10-CM | POA: Insufficient documentation

## 2022-01-13 LAB — ERYTHROPOIETIN: Erythropoietin: 8.1 m[IU]/mL (ref 2.6–18.5)

## 2022-01-13 NOTE — Assessment & Plan Note (Signed)
  This is a very pleasant 71 yr old female patient with PMH of atrial fibrillation, cardiomyopathy referred to hematology for polycythemia.  I discussed with the patient extensively the differential diagnosis of polycythemia 1. Primary polycythemia due to clonal stem cell abnormality 2. Secondary polycythemia due to cause that include hypoxia, heart or lung problems, altitude, athletics, erythropoietin producing lesion/tumors etc  Recommendation:  1. JAK-2 mutation testing to evaluate polycythemia vera 2. Erythropoietin level 3. Drink 8 glasses of water per day  Indications for phlebotomy  1. Primary polycythemia with hematocrit over 45 2. Secondary polycythemia with severe symptoms which include strokelike symptoms, severe recurrent headaches, severe fatigue or if HCT is greater than 54.  If the JAK-2 mutation is normal and erythropoietin level is normal, a bone marrow biopsy may be considered. If the erythropoietin is elevated, she will need ultrasound of the liver and kidney for further evaluation.  Since this is a chronic finding, I have reviewed her labs for the past 4 years and she has always had mild polycythemia with hemoglobin around 16 g/dL, I do not believe there is primary polycythemia but I agree with the above-mentioned investigation.  Have also encouraged her to consider sleep apnea testing.  There is no indication for phlebotomy at, there is no evidence of hyperviscosity symptoms.  I will call her back in 2 weeks to review lab results and to discuss additional recommendations.  I provided patient written information on polycythemia.

## 2022-01-26 ENCOUNTER — Inpatient Hospital Stay: Payer: Medicare Other | Attending: Hematology and Oncology | Admitting: Hematology and Oncology

## 2022-01-26 DIAGNOSIS — D751 Secondary polycythemia: Secondary | ICD-10-CM

## 2022-01-26 NOTE — Progress Notes (Signed)
[facility] NOTE  Patient Care Team: Tysinger, Camelia Eng, PA-C as PCP - General (Family Medicine) Wilford Corner, MD as Consulting Physician (Gastroenterology) Zadie Rhine Clent Demark, MD as Consulting Physician (Ophthalmology) Adrian Prows, MD as Consulting Physician (Cardiology) Clemon Chambers, MD as Consulting Physician (Allergy and Immunology)  CHIEF COMPLAINTS/PURPOSE OF CONSULTATION:  Polycythemia  ASSESSMENT & PLAN:  No problem-specific Assessment & Plan notes found for this encounter.  No orders of the defined types were placed in this encounter.    HISTORY OF PRESENTING ILLNESS:  Rita Wright 71 y.o. female is here because of polycythemia.  This is a very pleasant 71 year old female patient with past medical history significant for A-fib, dilated cardiomyopathy followed by cardiology who was referred for evaluation of polycythemia.    She is here for telephone visit. She denies any new complaints.  REVIEW OF SYSTEMS:   Constitutional: Denies fevers, chills or abnormal night sweats Eyes: Denies blurriness of vision, double vision or watery eyes Ears, nose, mouth, throat, and face: Denies mucositis or sore throat Respiratory: Denies cough, dyspnea or wheezes Cardiovascular: Denies palpitation, chest discomfort or lower extremity swelling Gastrointestinal:  Denies nausea, heartburn or change in bowel habits Skin: Denies abnormal skin rashes Lymphatics: Denies new lymphadenopathy or easy bruising Neurological:Denies numbness, tingling or new weaknesses Behavioral/Psych: Mood is stable, no new changes  All other systems were reviewed with the patient and are negative.  MEDICAL HISTORY:  Past Medical History:  Diagnosis Date   Colon cancer screening 09/2014   Cologuard test negative    Depression    Dilated cardiomyopathy ([place]) 05/2014   Dr. Woody Seller   Echocardiogram abnormal 05/2014   mild dilation of Left Ventricle; Dr. Woody Seller   Hashimoto's thyroiditis     History of mammogram 10/10/14   normal   Multinodular goiter    Noncompliance with diagnostic testing    due to severe anxiety   Osteoporosis    Panic disorder    severe, doesn't drive, doesn't use stairs or elevators, severely limited by her anxiety   Renal insufficiency    Thyroid nodule 7/89/38   benign follicular nodule on biopsy    SURGICAL HISTORY: Past Surgical History:  Procedure Laterality Date   COLONOSCOPY  2014   cologuard 2016; refuses colonoscopy   TUBAL LIGATION  1986    SOCIAL HISTORY: Social History   Socioeconomic History   Marital status: Married    Spouse name: Not on file   Number of children: 2   Years of education: Not on file   Highest education level: Not on file  Occupational History   Not on file  Tobacco Use   Smoking status: Former    Packs/day: 1.50    Years: 35.00    Total pack years: 52.50    Types: Cigarettes    Quit date: 02/23/1992    Years since quitting: 29.9   Smokeless tobacco: Never  Vaping Use   Vaping Use: Never used  Substance and Sexual Activity   Alcohol use: No   Drug use: No   Sexual activity: Not Currently  Other Topics Concern   Not on file  Social History Narrative   Married.  Exercise with walking her dog, gardening.      Social Determinants of Health   Financial Resource Strain: Low Risk  (12/11/2021)   Overall Financial Resource Strain (CARDIA)    Difficulty of Paying Living Expenses: Not hard at all  Food Insecurity: No Food Insecurity (12/11/2021)   Hunger Vital  Sign    Worried About Charity fundraiser in the Last Year: Never true    [place] in the Last Year: Never true  Transportation Needs: No Transportation Needs (12/11/2021)   PRAPARE - Hydrologist (Medical): No    Lack of Transportation (Non-Medical): No  Physical Activity: Insufficiently Active (12/11/2021)   Exercise Vital Sign    Days of Exercise per Week: 2 days    Minutes of Exercise per Session: 10  min  Stress: No Stress Concern Present (12/11/2021)   [place]    Feeling of Stress : Not at all  Social Connections: Not on file  Intimate Partner Violence: Not on file    FAMILY HISTORY: Family History  Problem Relation Age of Onset   Hypertension Mother    Parkinson's disease Father    Heart failure Sister    Hypertension Brother    Cerebral aneurysm Sister    Throat cancer Brother     ALLERGIES:  is allergic to valsartan, boniva [ibandronic acid], erythromycin, fosamax [alendronate], prednisone, and penicillins.  MEDICATIONS:  Current Outpatient Medications  Medication Sig Dispense Refill   cholecalciferol (VITAMIN D3) 25 MCG (1000 UNIT) tablet Take 1 tablet (1,000 Units total) by mouth daily. 90 tablet 0   folic acid (FOLVITE) 1 MG tablet Take 1 tablet (1 mg total) by mouth daily. 90 tablet 0   levothyroxine (SYNTHROID) 100 MCG tablet TAKE 1 TABLET BY MOUTH EVERY DAY BEFORE BREAKFAST 90 tablet 0   metoprolol tartrate (LOPRESSOR) 50 MG tablet Take 1 tablet (50 mg total) by mouth 2 (two) times daily. 180 tablet 0   Rivaroxaban (XARELTO) 15 MG TABS tablet Take 1 tablet (15 mg total) by mouth daily. 90 tablet 0   rosuvastatin (CRESTOR) 5 MG tablet Take 1 tablet (5 mg total) by mouth daily. 90 tablet 3   No current facility-administered medications for this visit.     PHYSICAL EXAMINATION: ECOG PERFORMANCE STATUS: 0 - Asymptomatic  There were no vitals filed for this visit.  There were no vitals filed for this visit.   GENERAL:alert, no distress and comfortable SKIN: skin color, texture, turgor are normal, no rashes or significant lesions EYES: normal, conjunctiva are pink and non-injected, sclera clear OROPHARYNX:no exudate, no erythema and lips, buccal mucosa, and tongue normal  NECK: supple, thyroid normal size, non-tender, without nodularity LYMPH:  no palpable lymphadenopathy in the cervical,  axillary, or however you do have LUNGS: clear to auscultation and percussion with normal breathing effort HEART: regular rate & rhythm and no murmurs and no lower extremity edema ABDOMEN:abdomen soft, non-tender and normal bowel sounds Musculoskeletal:no cyanosis of digits and no clubbing  PSYCH: alert & oriented x 3 with fluent speech NEURO: no focal motor/sensory deficits  LABORATORY DATA:  I have reviewed the data as listed Lab Results  Component Value Date   WBC 6.7 01/12/2022   HGB 16.3 (H) 01/12/2022   HCT 48.0 (H) 01/12/2022   MCV 94.9 01/12/2022   PLT 143 (L) 01/12/2022     Chemistry      Component Value Date/Time   NA 138 01/12/2022 1128   NA 140 12/16/2021 1030   K 4.5 01/12/2022 1128   CL 104 01/12/2022 1128   CO2 30 01/12/2022 1128   BUN 42 (H) 01/12/2022 1128   BUN 40 (H) 12/16/2021 1030   CREATININE 1.13 (H) 01/12/2022 1128   CREATININE 0.87 09/01/2016 0818  Component Value Date/Time   CALCIUM 10.0 01/12/2022 1128   ALKPHOS 76 01/12/2022 1128   AST 43 (H) 01/12/2022 1128   ALT 60 (H) 01/12/2022 1128   BILITOT 0.8 01/12/2022 1128   BILITOT 0.6 12/16/2021 1030       RADIOGRAPHIC STUDIES: I have personally reviewed the radiological images as listed and agreed with the findings in the report. No results found.  All questions were answered. The patient knows to call the clinic with any problems, questions or concerns. I spent 30 minutes in the care of this patient including H and P, review of records, counseling and coordination of care.     Benay Pike, MD 01/26/2022 1:02 PM

## 2022-01-27 ENCOUNTER — Encounter: Payer: Self-pay | Admitting: Hematology and Oncology

## 2022-02-08 LAB — JAK2 (INCLUDING V617F AND EXON 12), MPL,& CALR W/RFL MPN PANEL (NGS)

## 2022-02-11 ENCOUNTER — Encounter (INDEPENDENT_AMBULATORY_CARE_PROVIDER_SITE_OTHER): Payer: Medicare Other | Admitting: Ophthalmology

## 2022-02-11 ENCOUNTER — Encounter (INDEPENDENT_AMBULATORY_CARE_PROVIDER_SITE_OTHER): Payer: Self-pay

## 2022-02-11 DIAGNOSIS — H2513 Age-related nuclear cataract, bilateral: Secondary | ICD-10-CM | POA: Diagnosis not present

## 2022-02-11 DIAGNOSIS — D3131 Benign neoplasm of right choroid: Secondary | ICD-10-CM | POA: Diagnosis not present

## 2022-02-23 ENCOUNTER — Telehealth: Payer: Self-pay | Admitting: Medical

## 2022-02-23 NOTE — Telephone Encounter (Signed)
Pt called & states her meds are usually filled for a year at her physical in October but got notice from pharmacy no additional refills were sent in.   She needs refills sent to [facility] by mail    folic acid (FOLVITE) 1 MG tablet levothyroxine (SYNTHROID) 100 MCG tablet metoprolol tartrate (LOPRESSOR) 50 MG tablet Rivaroxaban (XARELTO) 15 MG TABS tablet

## 2022-02-24 ENCOUNTER — Other Ambulatory Visit: Payer: Self-pay | Admitting: Medical

## 2022-02-24 DIAGNOSIS — E2839 Other primary ovarian failure: Secondary | ICD-10-CM

## 2022-02-24 DIAGNOSIS — I4891 Unspecified atrial fibrillation: Secondary | ICD-10-CM

## 2022-02-24 DIAGNOSIS — E039 Hypothyroidism, unspecified: Secondary | ICD-10-CM

## 2022-02-24 MED ORDER — METOPROLOL TARTRATE 50 MG PO TABS: 50.0000 mg | ORAL_TABLET | Freq: Two times a day (BID) | ORAL | 3 refills | Status: DC

## 2022-02-24 MED ORDER — LEVOTHYROXINE SODIUM 100 MCG PO TABS: ORAL_TABLET | ORAL | 3 refills | Status: DC

## 2022-02-24 MED ORDER — RIVAROXABAN 15 MG PO TABS: 15.0000 mg | ORAL_TABLET | Freq: Every day | ORAL | 3 refills | Status: DC

## 2022-02-24 MED ORDER — ROSUVASTATIN CALCIUM 5 MG PO TABS: 5.0000 mg | ORAL_TABLET | Freq: Every day | ORAL | 3 refills | Status: DC

## 2022-02-24 MED ORDER — FOLIC ACID 1 MG PO TABS: 1.0000 mg | ORAL_TABLET | Freq: Every day | ORAL | 3 refills | Status: DC

## 2022-02-26 NOTE — Telephone Encounter (Signed)
done

## 2022-04-05 ENCOUNTER — Ambulatory Visit: Payer: Medicare Other | Admitting: Cardiology

## 2022-04-13 ENCOUNTER — Encounter: Payer: Self-pay | Admitting: Cardiology

## 2022-04-13 ENCOUNTER — Ambulatory Visit: Payer: Medicare Other | Admitting: Cardiology

## 2022-04-13 VITALS — BP 107/62 | HR 66 | Resp 16 | Ht 67.0 in | Wt 145.4 lb

## 2022-04-13 DIAGNOSIS — I428 Other cardiomyopathies: Secondary | ICD-10-CM

## 2022-04-13 DIAGNOSIS — I4821 Permanent atrial fibrillation: Secondary | ICD-10-CM | POA: Diagnosis not present

## 2022-04-13 DIAGNOSIS — N1831 Chronic kidney disease, stage 3a: Secondary | ICD-10-CM | POA: Diagnosis not present

## 2022-04-13 DIAGNOSIS — E7800 Pure hypercholesterolemia, unspecified: Secondary | ICD-10-CM | POA: Diagnosis not present

## 2022-04-13 DIAGNOSIS — I4891 Unspecified atrial fibrillation: Secondary | ICD-10-CM

## 2022-04-13 MED ORDER — RIVAROXABAN 15 MG PO TABS: 15.0000 mg | ORAL_TABLET | Freq: Every day | ORAL | 3 refills | Status: DC

## 2022-04-13 MED ORDER — METOPROLOL TARTRATE 50 MG PO TABS: 50.0000 mg | ORAL_TABLET | Freq: Two times a day (BID) | ORAL | 3 refills | Status: DC

## 2022-04-13 NOTE — Progress Notes (Signed)
Primary Physician:  Carlena Hurl, PA-C   Patient ID: Rita Wright, female    DOB: [birth year], 72 y.o.   MRN: [id number]  Subjective:    Chief Complaint  Patient presents with   Atrial Fibrillation    HPI: Rita Wright  is a 72 y.o. 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   By nuclear stress test in 2019, felt to be nonischemic.  Patient did not want cardioversion or atrial fibrillation ablation evaluation.  She is also very averse to starting any medications.  This is a 73-monthoffice visit.  Denies any chest pain, dyspnea, continues to exercise regularly.  Tolerating anticoagulation without bleeding diathesis.  She has started on lipid-lowering therapy since October 2022 but has discontinued this.  She has chronically elevated mild LFTs.  She is being followed by GI as well.  Past Medical History:  Diagnosis Date   Colon cancer screening 09/2014   Cologuard test negative    Depression    Dilated cardiomyopathy ([facility] 05/2014   Dr. VWoody Seller  Echocardiogram abnormal 05/2014   mild dilation of Left Ventricle; Dr. VWoody Seller  Hashimoto's thyroiditis    History of mammogram 10/10/2014   normal   Multinodular goiter    Noncompliance with diagnostic testing    due to severe anxiety   Osteoporosis    Panic disorder    severe, doesn't drive, doesn't use stairs or elevators, severely limited by her anxiety   Permanent atrial fibrillation ([place] 08/22/2017   CHA2DS2-VASCScore: Risk Score 3,  Yearly risk of stroke  3.2. Recommendation: Anticoagulation                Renal insufficiency    Thyroid nodule [id number]  benign follicular nodule on biopsy    Past Surgical History:  Procedure Laterality Date   COLONOSCOPY  2014   cologuard 2016; refuses colonoscopy   TUBAL LIGATION  1986    Social History   Tobacco Use   Smoking status: Former    Packs/day: 1.50    Years: 35.00    Total pack years: 52.50    Types: Cigarettes    Quit date: 02/23/1992    Years since quitting: 30.1   Smokeless tobacco: Never  Substance Use Topics   Alcohol use: No   Marital Status: Married   Review of Systems  Cardiovascular:  Negative for chest pain, dyspnea on exertion and leg swelling.  Psychiatric/Behavioral:  Positive for depression. The patient is nervous/anxious.    Objective:  Blood pressure 107/62, pulse 66, resp. rate 16, height 5' 7"$  (1.702 m), weight 145 lb 6.4 oz (66 kg), last menstrual period 02/23/1992, SpO2 95 %. Body mass index is 22.77 kg/m.      04/13/2022   10:55 AM 01/12/2022   10:12 AM 12/16/2021    9:45 AM  Vitals with BMI  Height 5' 7"$  5' 7"$  5' 7.5"  Weight 145 lbs 6 oz 145 lbs 8 oz 145 lbs 13 oz  BMI 22.77 [id number] Systolic [id number] Diastolic 62 91 70  Pulse 66 80 91       Physical Exam Vitals reviewed.  Constitutional:      Appearance: She is well-developed.  Neck:     Vascular: No carotid bruit or JVD.  Cardiovascular:     Rate and Rhythm: Normal rate. Rhythm  irregularly irregular.     Pulses: Normal pulses and intact distal pulses.     Heart sounds: Normal heart sounds.  Pulmonary:     Effort: Pulmonary effort is normal. No accessory muscle usage or respiratory distress.     Breath sounds: Normal breath sounds.  Abdominal:     General: Bowel sounds are normal.     Palpations: Abdomen is soft.  Musculoskeletal:     Right lower leg: No edema.     Left lower leg: No edema.     Radiology: No results found.  Laboratory examination:       Latest Ref Rng & Units 01/12/2022   11:28 AM 12/16/2021   10:30 AM 12/10/2020    9:58 AM  CMP  Glucose 70 - 99 mg/dL 92  96  99   BUN 8 - 23 mg/dL 42  40  42   Creatinine 0.44 - 1.00 mg/dL 1.13  0.88  0.88   Sodium 135 - 145 mmol/L 138  140  141   Potassium 3.5 - 5.1 mmol/L 4.5  4.6   4.6   Chloride 98 - 111 mmol/L 104  101  102   CO2 22 - 32 mmol/L 30  25  23   $ Calcium 8.9 - 10.3 mg/dL 10.0  9.4  9.8   Total Protein 6.5 - 8.1 g/dL 7.1  6.6  6.9   Total Bilirubin 0.3 - 1.2 mg/dL 0.8  0.6  0.8   Alkaline Phos 38 - 126 U/L 76  91  96   AST 15 - 41 U/L 43  36  40   ALT 0 - 44 U/L 60  46  55       Latest Ref Rng & Units 01/12/2022   11:28 AM 12/16/2021   10:30 AM 12/10/2020    9:58 AM  CBC  WBC 4.0 - 10.5 K/uL 6.7  5.3  6.7   Hemoglobin 12.0 - 15.0 g/dL 16.3  16.2  16.6   Hematocrit 36.0 - 46.0 % 48.0  48.3  50.4   Platelets 150 - 400 K/uL 143  154  151    Lipid Panel     Component Value Date/Time   CHOL 218 (H) 12/16/2021 1030   TRIG 68 12/16/2021 1030   HDL 89 12/16/2021 1030   CHOLHDL 2.4 12/16/2021 1030   CHOLHDL 2.5 09/01/2016 0818   VLDL 12 09/01/2016 0818   LDLCALC 117 (H) 12/16/2021 1030       NHDL                                                            139  HEMOGLOBIN A1C Lab Results  Component Value Date   HGBA1C 5.7 (H) 12/16/2021   TSH Recent Labs    12/16/21 1030  TSH 3.210    Allergies and medications   Allergies  Allergen Reactions   Valsartan Cough   Boniva [Ibandronic Acid]     Horrible joint pains   Erythromycin     GI upset   Fosamax [Alendronate]     Arthralgias, myalgias    Prednisone Other (See Comments)    Jittery, insomnia, intolerance   Penicillins Hives, Rash and Other (See Comments)    Felt like she was going to pass out.    Current Outpatient Medications:  cholecalciferol (VITAMIN D3) 25 MCG (1000 UNIT) tablet, Take 1 tablet (1,000 Units total) by mouth daily., Disp: 90 tablet, Rfl: 0   folic acid (FOLVITE) 1 MG tablet, Take 1 tablet (1 mg total) by mouth daily., Disp: 90 tablet, Rfl: 3   levothyroxine (SYNTHROID) 100 MCG tablet, TAKE 1 TABLET BY MOUTH EVERY DAY BEFORE BREAKFAST, Disp: 90 tablet, Rfl: 3   metoprolol tartrate (LOPRESSOR) 50 MG tablet, Take 1 tablet (50 mg total) by mouth 2 (two) times  daily., Disp: 180 tablet, Rfl: 3   Rivaroxaban (XARELTO) 15 MG TABS tablet, Take 1 tablet (15 mg total) by mouth daily., Disp: 90 tablet, Rfl: 3    Cardiac Studies:   Lexiscan myoview stress test 09/05/2017: 1. Lexiscan stress test was performed. Exercise capacity was not assessed. Stress symptoms included headache, "panic like symptoms". Peak blood pressure was 162/102 mmHg. The resting and stress electrocardiogram demonstrated atrial fibrillation with rapid ventricular response, RBBB + LAHB, no resting arrhythmias and normal rest repolarization. Stress EKG is non diagnostic for ischemia as it is a pharmacologic stress. 2. The overall quality of the study is good. Left ventricular cavity is noted to be normal on the rest and stress studies. Gated SPECT images reveal global reduction in normal myocardial thickening and wall motion. The left ventricular ejection fraction was calculated or visually estimated to be 26%. LVEF could be underestimated due to gating difficulties during Afib w/RVR. Review of the raw data in a rotational cine format reveals breast attenuation with imaging performed in sitting position. REST and STRESS images demonstrate small sized area of mildly decreased tracer uptake in the basal inferoseptal, basal inferior, mid inferoseptal and mid inferior segments of the left ventricle with no reversibility. Findings most likely represent breast attenuation artifact. 3. High risk study due to reduced LVEF. Recommend echocardiogram, preferably with controlled ventricular rate, for accurate LVEF estimation.  Lower Extremity Arterial Duplex 11/03/2018:  No hemodynamically significant stenoses are identified in the  lower extremity arterial system.  This exam reveals moderately decreased perfusion of the right lower extremity, noted at the post tibial artery level (ABI 0.75). This exam reveals moderately decreased perfusion of the left lower extremity, noted at the dorsalis pedis artery level  (ABI 0.75).  Study suggests diffuse disease.  Echocardiogram 03/20/2020: Mildly depressed LV systolic function with visual EF 45-50%. Left ventricle cavity is normal in size. Normal global wall motion. Unable to evaluate diastolic function due to underlying rhythm appears to be atrial flutter.  Normal LAP.  Left atrial cavity is mildly dilated. Mild (Grade I) aortic regurgitation. Mild (Grade I) mitral regurgitation. Mild tricuspid regurgitation. No evidence of pulmonary hypertension. Compared to prior study dated 06/27/2019: LVEF has improved from 20-25% to 45-50%, moderate MR and TR improved to mild AR, MR, TR.  EKG:  EKG 04/13/2022: Atrial fibrillation with controlled ventricular response at the rate of 68 bpm, left axis deviation, left anterior fascicular block.  Incomplete right bundle branch block.  Poor R progression, cannot exclude anteroseptal infarct old.  Nonspecific T abnormality.  Compared to 04/03/2021, no significant change.  Assessment:     ICD-10-CM   1. Non-ischemic cardiomyopathy (HCC)  I42.8 EKG 12-Lead    PCV ECHOCARDIOGRAM COMPLETE    metoprolol tartrate (LOPRESSOR) 50 MG tablet    2. Permanent atrial fibrillation (HCC)  I48.21 PCV ECHOCARDIOGRAM COMPLETE    metoprolol tartrate (LOPRESSOR) 50 MG tablet    Rivaroxaban (XARELTO) 15 MG TABS tablet    3. Stage 3a chronic kidney disease ([place])  N18.31     4. Hypercholesteremia  E78.00     5. Atrial fibrillation with controlled ventricular rate (HCC)  I48.91     6. Atrial fibrillation, unspecified type ([place])  I48.91      CHA2DS2-VASc Score is 3.  Yearly risk of stroke: 3.2% (A, F, CHF).  Score of 1=0.6; 2=2.2; 3=3.2; 4=4.8; 5=7.2; 6=9.8; 7=>9.8) -(CHF; HTN; vasc disease DM,  Female = 1; Age <65 =0; 65-74 = 1,  >75 =2; stroke/embolism= 2).   Meds ordered this encounter  Medications   metoprolol tartrate (LOPRESSOR) 50 MG tablet    Sig: Take 1 tablet (50 mg total) by mouth 2 (two) times daily.    Dispense:  180  tablet    Refill:  3   Rivaroxaban (XARELTO) 15 MG TABS tablet    Sig: Take 1 tablet (15 mg total) by mouth daily.    Dispense:  90 tablet    Refill:  3    Medications Discontinued During This Encounter  Medication Reason   rosuvastatin (CRESTOR) 5 MG tablet    Rivaroxaban (XARELTO) 15 MG TABS tablet Reorder   metoprolol tartrate (LOPRESSOR) 50 MG tablet Reorder     Orders Placed This Encounter  Procedures   EKG 12-Lead   PCV ECHOCARDIOGRAM COMPLETE    Standing Status:   Future    Standing Expiration Date:   04/14/2023    Recommendations:   Rita Wright  is a 72 y.o. 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   1. Non-ischemic cardiomyopathy ([place]) Patient presently doing well, remains asymptomatic without dyspnea, no leg edema, no PND or orthopnea.  Heart rate is also well-controlled.  She is not on guideline directed medical therapy as she does not want to be on more medications than she is presently on as she is doing well.  She is not on an ACE inhibitor or ARB.  Exam, if LVEF has again decreased, we could make it at case for adding Entresto.  2. Permanent atrial fibrillation ([place]) Per her wishes , she did not have cardioversion, ablation, or try antiarrhythmic therapy. She also did not want to try heart failure medications. She is on rate control therapy with Metoprolol 57m BID and Xarelto for anticoagulation that she is tolerating well. Reviewed her external labs.  Continue the same.  3. Stage 3a chronic kidney disease ([place] Blood pressure is well controlled, heart rate is also well controlled.  I reviewed her external labs, renal function has remained stable, CBC stable.  Preferably she should be on ACE inhibitor or an ARB but again as per her preference, she is presently on metoprolol and blood  pressure is well-controlled and heart rate is also well-controlled with regard to atrial fibrillation.  4. Hypercholesteremia After her last office visit a year ago, in view of her significant cardiovascular risks, I had advised her to start Crestor, she had agreed to start this at a very low-dose of 5 mg but decided not to continue. She does not want to be on a statin. Husband present and all questions answered.   I will see her back in a year.     JAdrian Prows MD, [facility]2/20/2024, 1[place]AM Office: [phone]

## 2022-05-11 ENCOUNTER — Ambulatory Visit: Payer: Medicare Other

## 2022-05-11 DIAGNOSIS — I4821 Permanent atrial fibrillation: Secondary | ICD-10-CM | POA: Diagnosis not present

## 2022-05-11 DIAGNOSIS — I428 Other cardiomyopathies: Secondary | ICD-10-CM

## 2022-06-11 ENCOUNTER — Other Ambulatory Visit: Payer: Self-pay | Admitting: Internal Medicine

## 2022-06-11 DIAGNOSIS — R7989 Other specified abnormal findings of blood chemistry: Secondary | ICD-10-CM

## 2022-06-15 ENCOUNTER — Other Ambulatory Visit: Payer: Medicare Other

## 2022-06-15 DIAGNOSIS — R7989 Other specified abnormal findings of blood chemistry: Secondary | ICD-10-CM | POA: Diagnosis not present

## 2022-06-16 LAB — HEPATIC FUNCTION PANEL
ALT: 41 IU/L — ABNORMAL HIGH (ref 0–32)
AST: 31 IU/L (ref 0–40)
Albumin: 3.7 g/dL — ABNORMAL LOW (ref 3.8–4.8)
Alkaline Phosphatase: 103 IU/L (ref 44–121)
Bilirubin Total: 0.5 mg/dL (ref 0.0–1.2)
Bilirubin, Direct: 0.15 mg/dL (ref 0.00–0.40)
Total Protein: 6.2 g/dL (ref 6.0–8.5)

## 2022-06-16 NOTE — Progress Notes (Signed)
I received this lab out of the blue.  I do not recall putting this order in.  Either way, liver test stable.  From her last visit she was going to reach out with Dr. Bosie Clos about follow-up, gastroenterology.  Has she seen Dr. Bosie Clos since her last visit here a few months ago as we discussed

## 2022-06-18 NOTE — Progress Notes (Signed)
Okay, they are stable.  Forward a copy to Dr. Bosie Clos

## 2022-07-29 ENCOUNTER — Other Ambulatory Visit: Payer: Self-pay

## 2022-07-29 ENCOUNTER — Inpatient Hospital Stay: Payer: Medicare Other

## 2022-07-29 ENCOUNTER — Inpatient Hospital Stay: Payer: Medicare Other | Attending: Hematology and Oncology | Admitting: Hematology and Oncology

## 2022-07-29 VITALS — BP 101/63 | HR 67 | Temp 97.7°F | Resp 17 | Wt 141.9 lb

## 2022-07-29 DIAGNOSIS — D751 Secondary polycythemia: Secondary | ICD-10-CM | POA: Diagnosis not present

## 2022-07-29 DIAGNOSIS — Z7901 Long term (current) use of anticoagulants: Secondary | ICD-10-CM | POA: Diagnosis not present

## 2022-07-29 DIAGNOSIS — Z79899 Other long term (current) drug therapy: Secondary | ICD-10-CM | POA: Diagnosis not present

## 2022-07-29 DIAGNOSIS — I4821 Permanent atrial fibrillation: Secondary | ICD-10-CM | POA: Insufficient documentation

## 2022-07-29 DIAGNOSIS — Z87891 Personal history of nicotine dependence: Secondary | ICD-10-CM | POA: Insufficient documentation

## 2022-07-29 LAB — COMPREHENSIVE METABOLIC PANEL
ALT: 38 U/L (ref 0–44)
AST: 29 U/L (ref 15–41)
Albumin: 3.9 g/dL (ref 3.5–5.0)
Alkaline Phosphatase: 75 U/L (ref 38–126)
Anion gap: 4 — ABNORMAL LOW (ref 5–15)
BUN: 46 mg/dL — ABNORMAL HIGH (ref 8–23)
CO2: 31 mmol/L (ref 22–32)
Calcium: 9.8 mg/dL (ref 8.9–10.3)
Chloride: 103 mmol/L (ref 98–111)
Creatinine, Ser: 1.34 mg/dL — ABNORMAL HIGH (ref 0.44–1.00)
GFR, Estimated: 42 mL/min — ABNORMAL LOW (ref >60)
Glucose, Bld: 93 mg/dL (ref 70–99)
Potassium: 4.9 mmol/L (ref 3.5–5.1)
Sodium: 138 mmol/L (ref 135–145)
Total Bilirubin: 0.8 mg/dL (ref 0.3–1.2)
Total Protein: 7 g/dL (ref 6.5–8.1)

## 2022-07-29 LAB — CBC WITH DIFFERENTIAL/PLATELET
Abs Immature Granulocytes: 0.01 10*3/uL (ref 0.00–0.07)
Basophils Absolute: 0 10*3/uL (ref 0.0–0.1)
Basophils Relative: 1 %
Eosinophils Absolute: 0.2 10*3/uL (ref 0.0–0.5)
Eosinophils Relative: 3 %
HCT: 47.9 % — ABNORMAL HIGH (ref 36.0–46.0)
Hemoglobin: 16.3 g/dL — ABNORMAL HIGH (ref 12.0–15.0)
Immature Granulocytes: 0 %
Lymphocytes Relative: 19 %
Lymphs Abs: 1.1 10*3/uL (ref 0.7–4.0)
MCH: 31.7 pg (ref 26.0–34.0)
MCHC: 34 g/dL (ref 30.0–36.0)
MCV: 93 fL (ref 80.0–100.0)
Monocytes Absolute: 0.4 10*3/uL (ref 0.1–1.0)
Monocytes Relative: 7 %
Neutro Abs: 4 10*3/uL (ref 1.7–7.7)
Neutrophils Relative %: 70 %
Platelets: 156 10*3/uL (ref 150–400)
RBC: 5.15 MIL/uL — ABNORMAL HIGH (ref 3.87–5.11)
RDW: 12.6 % (ref 11.5–15.5)
WBC: 5.7 10*3/uL (ref 4.0–10.5)
nRBC: 0 % (ref 0.0–0.2)

## 2022-07-29 NOTE — Progress Notes (Signed)
[facility] CONSULT NOTE  Patient Care Team: Tysinger, Kermit Balo, PA-C as PCP - General (Family Medicine) Charlott Rakes, MD as Consulting Physician (Gastroenterology) Luciana Axe Alford Highland, MD as Consulting Physician (Ophthalmology) Yates Decamp, MD as Consulting Physician (Cardiology) Verlee Monte, MD as Consulting Physician (Allergy and Immunology)  CHIEF COMPLAINTS/PURPOSE OF CONSULTATION:  Polycythemia  ASSESSMENT & PLAN:   This is a very pleasant 72 year old female patient with past medical history significant for atrial fibrillation, cardiomyopathy referred to hematology for polycythemia.  She is here to review lab results.  No evidence of JAK2 mutation.  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  Erythropoietin levels are normal.  CBC shows stable polycythemia for at least the past 3 years.  Since her last visit here, she denies any health complaints at all.  She says she is only feeling better.  No concerns on exam.  CBC today with stable hemoglobin 16.3 g/dL which has pretty much remained the same for the past 2 or 3 years, no leukocytosis or thrombocytosis.  We will continue to follow her every 6 months at this time.  She was encouraged to contact us with any new questions or concerns. Thank you for consulting [place] in the care of this patient.  Please not hesitate to contact us with any additional questions or concerns.  HISTORY OF PRESENTING ILLNESS:  AALEIGHA MERRELL 72 y.o. female is here because of polycythemia.  This is a very pleasant 72 year old female patient with past medical history significant for A-fib, dilated cardiomyopathy followed by cardiology who was referred for evaluation of polycythemia.   Since her last visit here, she states everything is going well.  She has no new B symptoms.  No interim infections or hospitalizations.  Rest of the pertinent 10 point ROS reviewed and negative  REVIEW OF SYSTEMS:   Constitutional: Denies  fevers, chills or abnormal night sweats Eyes: Denies blurriness of vision, double vision or watery eyes Ears, nose, mouth, throat, and face: Denies mucositis or sore throat Respiratory: Denies cough, dyspnea or wheezes Cardiovascular: Denies palpitation, chest discomfort or lower extremity swelling Gastrointestinal:  Denies nausea, heartburn or change in bowel habits Skin: Denies abnormal skin rashes Lymphatics: Denies new lymphadenopathy or easy bruising Neurological:Denies numbness, tingling or new weaknesses Behavioral/Psych: Mood is stable, no new changes  All other systems were reviewed with the patient and are negative.  MEDICAL HISTORY:  Past Medical History:  Diagnosis Date   Colon cancer screening 09/2014   Cologuard test negative    Depression    Dilated cardiomyopathy (HCC) 05/2014   Dr. Sherril Croon   Echocardiogram abnormal 05/2014   mild dilation of Left Ventricle; Dr. Sherril Croon   Hashimoto's thyroiditis    History of mammogram 10/10/2014   normal   Multinodular goiter    Noncompliance with diagnostic testing    due to severe anxiety   Osteoporosis    Panic disorder    severe, doesn't drive, doesn't use stairs or elevators, severely limited by her anxiety   Permanent atrial fibrillation (HCC) 08/22/2017   CHA2DS2-VASCScore: Risk Score 3,  Yearly risk of stroke  3.2. Recommendation: Anticoagulation                Renal insufficiency    Thyroid nodule 10/22/2014   benign follicular nodule on biopsy    SURGICAL HISTORY: Past Surgical History:  Procedure Laterality Date   COLONOSCOPY  2014   cologuard 2016; refuses colonoscopy   TUBAL LIGATION  1986    SOCIAL HISTORY: Social History   Socioeconomic History   Marital status: Married    Spouse name: Not on file   Number of children: 2   Years of education: Not on file   Highest education level: Not on file  Occupational History   Not on file  Tobacco Use   Smoking status: Former    Packs/day: 1.50    Years:  35.00    Additional pack years: 0.00    Total pack years: 52.50    Types: Cigarettes    Quit date: 02/23/1992    Years since quitting: 30.4   Smokeless tobacco: Never  Vaping Use   Vaping Use: Never used  Substance and Sexual Activity   Alcohol use: No   Drug use: No   Sexual activity: Not Currently  Other Topics Concern   Not on file  Social History Narrative   Married.  Exercise with walking her dog, gardening.      Social Determinants of Health   Financial Resource Strain: Low Risk  (12/11/2021)   Overall Financial Resource Strain (CARDIA)    Difficulty of Paying Living Expenses: Not hard at all  Food Insecurity: No Food Insecurity (12/11/2021)   Hunger Vital Sign    Worried About Running Out of Food in the Last Year: Never true    Ran Out of Food in the Last Year: Never true  Transportation Needs: No Transportation Needs (12/11/2021)   PRAPARE - Administrator, Civil Service (Medical): No    Lack of Transportation (Non-Medical): No  Physical Activity: Insufficiently Active (12/11/2021)   Exercise Vital Sign    Days of Exercise per Week: 2 days    Minutes of Exercise per Session: 10 min  Stress: No Stress Concern Present (12/11/2021)   [employer] of Occupational Health - Occupational Stress Questionnaire    Feeling of Stress : Not at all  Social Connections: Not on file  Intimate Partner Violence: Not on file    FAMILY HISTORY: Family History  Problem Relation Age of Onset   Hypertension Mother    Parkinson's disease Father    Heart failure Sister    Hypertension Brother    Cerebral aneurysm Sister    Throat cancer Brother     ALLERGIES:  is allergic to valsartan, boniva [ibandronic acid], erythromycin, fosamax [alendronate], prednisone, and penicillins.  MEDICATIONS:  Current Outpatient Medications  Medication Sig Dispense Refill   cholecalciferol (VITAMIN D3) 25 MCG (1000 UNIT) tablet Take 1 tablet (1,000 Units total) by mouth daily. 90  tablet 0   folic acid (FOLVITE) 1 MG tablet Take 1 tablet (1 mg total) by mouth daily. 90 tablet 3   levothyroxine (SYNTHROID) 100 MCG tablet TAKE 1 TABLET BY MOUTH EVERY DAY BEFORE BREAKFAST 90 tablet 3   metoprolol tartrate (LOPRESSOR) 50 MG tablet Take 1 tablet (50 mg total) by mouth 2 (two) times daily. 180 tablet 3   Rivaroxaban (XARELTO) 15 MG TABS tablet Take 1 tablet (15 mg total) by mouth daily. 90 tablet 3   No current facility-administered medications for this visit.     PHYSICAL EXAMINATION: ECOG PERFORMANCE STATUS: 0 - Asymptomatic  Vitals:   07/29/22 1140  BP: 101/63  Pulse: 67  Resp: 17  Temp: 97.7 F (36.5 C)  SpO2: 98%    Filed Weights   07/29/22 1140  Weight: 141 lb 14.4 oz (64.4 kg)   Physical Exam Constitutional:      Appearance: Normal appearance.  Cardiovascular:  Pulses: Normal pulses.     Heart sounds: Normal heart sounds.  Pulmonary:     Effort: Pulmonary effort is normal.     Breath sounds: Normal breath sounds.  Musculoskeletal:        General: Normal range of motion.     Cervical back: Normal range of motion and neck supple. No rigidity.  Lymphadenopathy:     Cervical: No cervical adenopathy.  Skin:    General: Skin is warm and dry.  Neurological:     General: No focal deficit present.     Mental Status: She is alert.  Psychiatric:        Mood and Affect: Mood normal.      LABORATORY DATA:  I have reviewed the data as listed Lab Results  Component Value Date   WBC 6.7 01/12/2022   HGB 16.3 (H) 01/12/2022   HCT 48.0 (H) 01/12/2022   MCV 94.9 01/12/2022   PLT 143 (L) 01/12/2022     Chemistry      Component Value Date/Time   NA 138 01/12/2022 1128   NA 140 12/16/2021 1030   K 4.5 01/12/2022 1128   CL 104 01/12/2022 1128   CO2 30 01/12/2022 1128   BUN 42 (H) 01/12/2022 1128   BUN 40 (H) 12/16/2021 1030   CREATININE 1.13 (H) 01/12/2022 1128   CREATININE 0.87 09/01/2016 0818      Component Value Date/Time   CALCIUM  10.0 01/12/2022 1128   ALKPHOS 103 06/15/2022 1543   AST 31 06/15/2022 1543   ALT 41 (H) 06/15/2022 1543   BILITOT 0.5 06/15/2022 1543       RADIOGRAPHIC STUDIES: I have personally reviewed the radiological images as listed and agreed with the findings in the report. No results found.  I discussed the limitations of evaluation and management by telemedicine. The patient expressed understanding and agreed to proceed.     Rachel Moulds, MD 07/29/2022 11:47 AM

## 2022-08-10 DIAGNOSIS — D3131 Benign neoplasm of right choroid: Secondary | ICD-10-CM | POA: Diagnosis not present

## 2022-08-10 DIAGNOSIS — H2513 Age-related nuclear cataract, bilateral: Secondary | ICD-10-CM | POA: Diagnosis not present

## 2022-12-14 ENCOUNTER — Ambulatory Visit: Payer: Medicare Other

## 2022-12-14 DIAGNOSIS — Z0000 Encounter for general adult medical examination without abnormal findings: Secondary | ICD-10-CM

## 2022-12-14 NOTE — Progress Notes (Signed)
Subjective:   Rita Wright is a 72 y.o. female who presents for Medicare Annual (Subsequent) preventive examination.  Visit Complete: Virtual I connected with  Rita Wright on 12/14/22 by a audio enabled telemedicine application and verified that I am speaking with the correct person using two identifiers.  Patient Location: Home  Provider Location: Office/Clinic  I discussed the limitations of evaluation and management by telemedicine. The patient expressed understanding and agreed to proceed.  Vital Signs: Because this visit was a virtual/telehealth visit, some criteria may be missing or patient reported. Any vitals not documented were not able to be obtained and vitals that have been documented are patient reported.  Patient Medicare AWV questionnaire was completed by the patient on 12/13/2022; I have confirmed that all information answered by patient is correct and no changes since this date.  Cardiac Risk Factors include: advanced age (>81men, >78 women)     Objective:    Today's Vitals   There is no height or weight on file to calculate BMI.     12/14/2022    3:00 PM 12/11/2021   11:31 AM 12/10/2020    9:27 AM 12/07/2018    9:22 AM  Advanced Directives  Does Patient Have a Medical Advance Directive? No No No No  Would patient like information on creating a medical advance directive?   Yes (MAU/Ambulatory/Procedural Areas - Information given) No - Patient declined    Current Medications (verified) Outpatient Encounter Medications as of 12/14/2022  Medication Sig   cholecalciferol (VITAMIN D3) 25 MCG (1000 UNIT) tablet Take 1 tablet (1,000 Units total) by mouth daily.   folic acid (FOLVITE) 1 MG tablet Take 1 tablet (1 mg total) by mouth daily.   levothyroxine (SYNTHROID) 100 MCG tablet TAKE 1 TABLET BY MOUTH EVERY DAY BEFORE BREAKFAST   metoprolol tartrate (LOPRESSOR) 50 MG tablet Take 1 tablet (50 mg total) by mouth 2 (two) times daily.   Rivaroxaban (XARELTO)  15 MG TABS tablet Take 1 tablet (15 mg total) by mouth daily.   No facility-administered encounter medications on file as of 12/14/2022.    Allergies (verified) Valsartan, Boniva [ibandronic acid], Erythromycin, Fosamax [alendronate], Prednisone, and Penicillins   History: Past Medical History:  Diagnosis Date   Colon cancer screening 09/2014   Cologuard test negative    Depression    Dilated cardiomyopathy (HCC) 05/2014   Dr. Sherril Croon   Echocardiogram abnormal 05/2014   mild dilation of Left Ventricle; Dr. Sherril Croon   Hashimoto's thyroiditis    History of mammogram 10/10/2014   normal   Multinodular goiter    Noncompliance with diagnostic testing    due to severe anxiety   Osteoporosis    Panic disorder    severe, doesn't drive, doesn't use stairs or elevators, severely limited by her anxiety   Permanent atrial fibrillation (HCC) 08/22/2017   CHA2DS2-VASCScore: Risk Score 3,  Yearly risk of stroke  3.2. Recommendation: Anticoagulation                Renal insufficiency    Thyroid nodule 10/22/2014   benign follicular nodule on biopsy   Past Surgical History:  Procedure Laterality Date   COLONOSCOPY  2014   cologuard 2016; refuses colonoscopy   TUBAL LIGATION  1986   Family History  Problem Relation Age of Onset   Hypertension Mother    Parkinson's disease Father    Heart failure Sister    Hypertension Brother    Cerebral aneurysm Sister    Throat cancer Brother  Social History   Socioeconomic History   Marital status: Married    Spouse name: Not on file   Number of children: 2   Years of education: Not on file   Highest education level: Not on file  Occupational History   Not on file  Tobacco Use   Smoking status: Former    Current packs/day: 0.00    Average packs/day: 1.5 packs/day for 35.0 years (52.5 ttl pk-yrs)    Types: Cigarettes    Start date: 02/22/1957    Quit date: 02/23/1992    Years since quitting: 30.8   Smokeless tobacco: Never  Vaping Use    Vaping status: Never Used  Substance and Sexual Activity   Alcohol use: No   Drug use: No   Sexual activity: Not Currently  Other Topics Concern   Not on file  Social History Narrative   Married.  Exercise with walking her dog, gardening.      Social Determinants of Health   Financial Resource Strain: Low Risk  (12/14/2022)   Overall Financial Resource Strain (CARDIA)    Difficulty of Paying Living Expenses: Not hard at all  Food Insecurity: No Food Insecurity (12/13/2022)   Hunger Vital Sign    Worried About Running Out of Food in the Last Year: Never true    Ran Out of Food in the Last Year: Never true  Transportation Needs: No Transportation Needs (12/13/2022)   PRAPARE - Administrator, Civil Service (Medical): No    Lack of Transportation (Non-Medical): No  Physical Activity: Inactive (12/13/2022)   Exercise Vital Sign    Days of Exercise per Week: 0 days    Minutes of Exercise per Session: 0 min  Stress: No Stress Concern Present (12/13/2022)   [employer] of Occupational Health - Occupational Stress Questionnaire    Feeling of Stress : Only a little  Social Connections: Unknown (12/13/2022)   Social Connection and Isolation Panel [NHANES]    Frequency of Communication with Friends and Family: Twice a week    Frequency of Social Gatherings with Friends and Family: Once a week    Attends Religious Services: Not on Marketing executive or Organizations: No    Attends Banker Meetings: Never    Marital Status: Married    Tobacco Counseling Counseling given: Not Answered   Clinical Intake:  Pre-visit preparation completed: Yes  Pain : No/denies pain     Nutritional Risks: None Diabetes: No  How often do you need to have someone help you when you read instructions, pamphlets, or other written materials from your doctor or pharmacy?: 1 - Never  Interpreter Needed?: No  Information entered by :: NAllen  LPN   Activities of Daily Living    12/13/2022    2:53 PM  In your present state of health, do you have any difficulty performing the following activities:  Hearing? 0  Vision? 0  Difficulty concentrating or making decisions? 0  Walking or climbing stairs? 0  Dressing or bathing? 0  Doing errands, shopping? 0  Preparing Food and eating ? N  Using the Toilet? N  In the past six months, have you accidently leaked urine? Y  Do you have problems with loss of bowel control? N  Managing your Medications? N  Managing your Finances? N  Housekeeping or managing your Housekeeping? N    Patient Care Team: Tysinger, Kermit Balo, PA-C as PCP - General (Family Medicine) Charlott Rakes, MD as  Consulting Physician (Gastroenterology) Luciana Axe Alford Highland, MD as Consulting Physician (Ophthalmology) Yates Decamp, MD as Consulting Physician (Cardiology) Verlee Monte, MD as Consulting Physician (Allergy and Immunology)  Indicate any recent Medical Services you may have received from other than [facility] providers in the past year (date may be approximate).     Assessment:   This is a routine wellness examination for Rita Wright.  Hearing/Vision screen Hearing Screening - Comments:: Denies hearing issues Vision Screening - Comments:: Regular eye exams, Dr. Luciana Axe   Goals Addressed             This Visit's Progress    Patient Stated       12/14/2022, stay alive       Depression Screen    12/14/2022    3:01 PM 12/11/2021   11:32 AM 12/10/2020    9:27 AM 12/10/2019    8:31 AM 12/07/2018    9:22 AM 12/05/2017   10:12 AM 09/19/2017    1:29 PM  PHQ 2/9 Scores  PHQ - 2 Score 0 0 0 1 1 0 3  PHQ- 9 Score 0 0     7    Fall Risk    12/13/2022    2:53 PM 12/11/2021   11:31 AM 12/08/2021    5:53 PM 12/10/2020    9:27 AM 12/10/2019    8:30 AM  Fall Risk   Falls in the past year? 0 0 0 0 1  Number falls in past yr: 0 0  0 0  Injury with Fall? 0 0  0 1  Comment     bruise on right arm  Risk  for fall due to : Medication side effect Medication side effect  No Fall Risks No Fall Risks  Follow up Falls prevention discussed;Falls evaluation completed Falls prevention discussed;Education provided;Falls evaluation completed  Falls evaluation completed Falls evaluation completed    MEDICARE RISK AT HOME: Medicare Risk at Home Any stairs in or around the home?: Yes If so, are there any without handrails?: Yes Home free of loose throw rugs in walkways, pet beds, electrical cords, etc?: No Adequate lighting in your home to reduce risk of falls?: Yes Life alert?: No Use of a cane, walker or w/c?: No Grab bars in the bathroom?: Yes Shower chair or bench in shower?: Yes Elevated toilet seat or a handicapped toilet?: Yes  TIMED UP AND GO:  Was the test performed?  No    Cognitive Function:        12/14/2022    3:04 PM 12/11/2021   11:33 AM  6CIT Screen  What Year? 0 points 0 points  What month? 0 points 0 points  What time? 0 points 0 points  Count back from 20 0 points 0 points  Months in reverse 2 points 2 points  Repeat phrase 0 points 0 points  Total Score 2 points 2 points    Immunizations Immunization History  Administered Date(s) Administered   Fluad Quad(high Dose 65+) 12/10/2019   Influenza Split 12/28/2010   Influenza Whole 11/14/2009   Influenza, High Dose Seasonal PF 12/05/2017   Influenza, Seasonal, Injecte, Preservative Fre 03/03/2012   Influenza,inj,Quad PF,6+ Mos 11/17/2012, 11/22/2013   Influenza-Unspecified 12/02/2015   PFIZER(Purple Top)SARS-COV-2 Vaccination 05/13/2019, 05/28/2019   Pneumococcal Conjugate-13 09/01/2016   Pneumococcal Polysaccharide-23 12/05/2017   Tdap 08/01/2008    TDAP status: Due, Education has been provided regarding the importance of this vaccine. Advised may receive this vaccine at local pharmacy or Health Dept. Aware to provide a  copy of the vaccination record if obtained from local pharmacy or Health Dept. Verbalized  acceptance and understanding.  Flu Vaccine status: Declined, Education has been provided regarding the importance of this vaccine but patient still declined. Advised may receive this vaccine at local pharmacy or Health Dept. Aware to provide a copy of the vaccination record if obtained from local pharmacy or Health Dept. Verbalized acceptance and understanding.  Pneumococcal vaccine status: Due, Education has been provided regarding the importance of this vaccine. Advised may receive this vaccine at local pharmacy or Health Dept. Aware to provide a copy of the vaccination record if obtained from local pharmacy or Health Dept. Verbalized acceptance and understanding.  Covid-19 vaccine status: Information provided on how to obtain vaccines.   Qualifies for Shingles Vaccine? Yes   Zostavax completed No   Shingrix Completed?: No.    Education has been provided regarding the importance of this vaccine. Patient has been advised to call insurance company to determine out of pocket expense if they have not yet received this vaccine. Advised may also receive vaccine at local pharmacy or Health Dept. Verbalized acceptance and understanding.  Screening Tests Health Maintenance  Topic Date Due   Zoster Vaccines- Shingrix (1 of 2) Never done   DTaP/Tdap/Td (2 - Td or Tdap) 08/02/2018   Pneumonia Vaccine 37+ Years old (2 of 2 - PCV) 12/06/2018   Colonoscopy  04/18/2022   COVID-19 Vaccine (3 - Pfizer risk series) 12/30/2022 (Originally 06/25/2019)   INFLUENZA VACCINE  05/23/2023 (Originally 09/23/2022)   MAMMOGRAM  01/02/2023   Medicare Annual Wellness (AWV)  12/14/2023   DEXA SCAN  Completed   Hepatitis C Screening  Completed   HPV VACCINES  Aged Out    Health Maintenance  Health Maintenance Due  Topic Date Due   Zoster Vaccines- Shingrix (1 of 2) Never done   DTaP/Tdap/Td (2 - Td or Tdap) 08/02/2018   Pneumonia Vaccine 3+ Years old (2 of 2 - PCV) 12/06/2018   Colonoscopy  04/18/2022     Colorectal cancer screening: Type of screening: Cologuard. Completed 03/06/2021. Repeat every 3 years  Mammogram status: Completed 01/01/2021. Repeat every 2 years  Bone Density status: Completed 03/02/2006.   Lung Cancer Screening: (Low Dose CT Chest recommended if Age 61-80 years, 20 pack-year currently smoking OR have quit w/in 15years.) does not qualify.   Lung Cancer Screening Referral: no  Additional Screening:  Hepatitis C Screening: does qualify; Completed 12/07/2018  Vision Screening: Recommended annual ophthalmology exams for early detection of glaucoma and other disorders of the eye. Is the patient up to date with their annual eye exam?  Yes  Who is the provider or what is the name of the office in which the patient attends annual eye exams? Dr. Luciana Axe If pt is not established with a provider, would they like to be referred to a provider to establish care? No .   Dental Screening: Recommended annual dental exams for proper oral hygiene  Diabetic Foot Exam: n/a  Community Resource Referral / Chronic Care Management: CRR required this visit?  No   CCM required this visit?  No     Plan:     I have personally reviewed and noted the following in the patient's chart:   Medical and social history Use of alcohol, tobacco or illicit drugs  Current medications and supplements including opioid prescriptions. Patient is not currently taking opioid prescriptions. Functional ability and status Nutritional status Physical activity Advanced directives List of other physicians Hospitalizations, surgeries, and  ER visits in previous 12 months Vitals Screenings to include cognitive, depression, and falls Referrals and appointments  In addition, I have reviewed and discussed with patient certain preventive protocols, quality metrics, and best practice recommendations. A written personalized care plan for preventive services as well as general preventive health recommendations  were provided to patient.     Barb Merino, LPN   [id number]   After Visit Summary: (MyChart) Due to this being a telephonic visit, the after visit summary with patients personalized plan was offered to patient via MyChart   Nurse Notes: none

## 2022-12-14 NOTE — Patient Instructions (Signed)
Ms. Turknett , Thank you for taking time to come for your Medicare Wellness Visit. I appreciate your ongoing commitment to your health goals. Please review the following plan we discussed and let me know if I can assist you in the future.   Referrals/Orders/Follow-Ups/Clinician Recommendations: none  This is a list of the screening recommended for you and due dates:  Health Maintenance  Topic Date Due   Zoster (Shingles) Vaccine (1 of 2) Never done   DTaP/Tdap/Td vaccine (2 - Td or Tdap) 08/02/2018   Pneumonia Vaccine (2 of 2 - PCV) 12/06/2018   Colon Cancer Screening  04/18/2022   COVID-19 Vaccine (3 - Pfizer risk series) 12/30/2022*   Flu Shot  05/23/2023*   Mammogram  01/02/2023   Medicare Annual Wellness Visit  12/14/2023   DEXA scan (bone density measurement)  Completed   Hepatitis C Screening  Completed   HPV Vaccine  Aged Out  *Topic was postponed. The date shown is not the original due date.    Advanced directives: (Declined) Advance directive discussed with you today.   Next Medicare Annual Wellness Visit scheduled for next year: Yes  Insert Preventive Care attachment Insert FALL PREVENTION attachment if needed

## 2022-12-22 ENCOUNTER — Ambulatory Visit (INDEPENDENT_AMBULATORY_CARE_PROVIDER_SITE_OTHER): Payer: Medicare Other | Admitting: Medical

## 2022-12-22 VITALS — BP 118/72 | HR 89 | Ht 67.0 in | Wt 145.6 lb

## 2022-12-22 DIAGNOSIS — R7301 Impaired fasting glucose: Secondary | ICD-10-CM

## 2022-12-22 DIAGNOSIS — D751 Secondary polycythemia: Secondary | ICD-10-CM | POA: Diagnosis not present

## 2022-12-22 DIAGNOSIS — Z780 Asymptomatic menopausal state: Secondary | ICD-10-CM | POA: Diagnosis not present

## 2022-12-22 DIAGNOSIS — I4821 Permanent atrial fibrillation: Secondary | ICD-10-CM | POA: Diagnosis not present

## 2022-12-22 DIAGNOSIS — N1831 Chronic kidney disease, stage 3a: Secondary | ICD-10-CM | POA: Diagnosis not present

## 2022-12-22 DIAGNOSIS — M810 Age-related osteoporosis without current pathological fracture: Secondary | ICD-10-CM | POA: Diagnosis not present

## 2022-12-22 DIAGNOSIS — I739 Peripheral vascular disease, unspecified: Secondary | ICD-10-CM | POA: Diagnosis not present

## 2022-12-22 DIAGNOSIS — Z7189 Other specified counseling: Secondary | ICD-10-CM

## 2022-12-22 DIAGNOSIS — E2839 Other primary ovarian failure: Secondary | ICD-10-CM | POA: Diagnosis not present

## 2022-12-22 DIAGNOSIS — Z2820 Immunization not carried out because of patient decision for unspecified reason: Secondary | ICD-10-CM

## 2022-12-22 DIAGNOSIS — Z1231 Encounter for screening mammogram for malignant neoplasm of breast: Secondary | ICD-10-CM

## 2022-12-22 DIAGNOSIS — E039 Hypothyroidism, unspecified: Secondary | ICD-10-CM

## 2022-12-22 DIAGNOSIS — F411 Generalized anxiety disorder: Secondary | ICD-10-CM

## 2022-12-22 DIAGNOSIS — E7800 Pure hypercholesterolemia, unspecified: Secondary | ICD-10-CM

## 2022-12-22 NOTE — Progress Notes (Signed)
Subjective:   HPI  Rita Wright is a 72 y.o. female who presents for Chief Complaint  Patient presents with   Medical Management of Chronic Issues    Med check. No concerns. No covid or flu    Patient Care Team: Sriman Tally, Kermit Balo, PA-C as PCP - General (Family Medicine) Charlott Rakes, MD as Consulting Physician (Gastroenterology) Luciana Axe Alford Highland, MD as Consulting Physician (Ophthalmology) Yates Decamp, MD as Consulting Physician (Cardiology) Verlee Monte, MD as Consulting Physician (Allergy and Immunology) Dr. Rachel Moulds, hematology Sees dentist Sees eye doctor   Concerns: Medications reviewed.  She reports compliance with medications.  Osteoporosis - didn't tolerate boniva and fosamax.  Declines other treatment  After reaction to flu shot and prior vaccines, covid vaccine reaction 2022, she declines any other vaccines due to fear of allergic reaction.  She notes ongoing problems with her health since covid vaccine reaction in 2022.      Past Medical History:  Diagnosis Date   Colon cancer screening 09/2014   Cologuard test negative    Depression    Dilated cardiomyopathy (HCC) 05/2014   Dr. Sherril Croon   Echocardiogram abnormal 05/2014   mild dilation of Left Ventricle; Dr. Sherril Croon   Hashimoto's thyroiditis    History of mammogram 10/10/2014   normal   Multinodular goiter    Noncompliance with diagnostic testing    due to severe anxiety   Osteoporosis    Panic disorder    severe, doesn't drive, doesn't use stairs or elevators, severely limited by her anxiety   Permanent atrial fibrillation (HCC) 08/22/2017   CHA2DS2-VASCScore: Risk Score 3,  Yearly risk of stroke  3.2. Recommendation: Anticoagulation                Renal insufficiency    Thyroid nodule 10/22/2014   benign follicular nodule on biopsy    Family History  Problem Relation Age of Onset   Hypertension Mother    Parkinson's disease Father    Heart failure Sister    Hypertension Brother     Cerebral aneurysm Sister    Throat cancer Brother      Current Outpatient Medications:    cholecalciferol (VITAMIN D3) 25 MCG (1000 UNIT) tablet, Take 1 tablet (1,000 Units total) by mouth daily., Disp: 90 tablet, Rfl: 0   folic acid (FOLVITE) 1 MG tablet, Take 1 tablet (1 mg total) by mouth daily., Disp: 90 tablet, Rfl: 3   levothyroxine (SYNTHROID) 100 MCG tablet, TAKE 1 TABLET BY MOUTH EVERY DAY BEFORE BREAKFAST, Disp: 90 tablet, Rfl: 3   metoprolol tartrate (LOPRESSOR) 50 MG tablet, Take 1 tablet (50 mg total) by mouth 2 (two) times daily., Disp: 180 tablet, Rfl: 3   Rivaroxaban (XARELTO) 15 MG TABS tablet, Take 1 tablet (15 mg total) by mouth daily., Disp: 90 tablet, Rfl: 3  Allergies  Allergen Reactions   Valsartan Cough   Boniva [Ibandronic Acid]     Horrible joint pains   Covid-19 (Mrna) Vaccine     Multiple symptoms   Erythromycin     GI upset   Fosamax [Alendronate]     Arthralgias, myalgias    Prednisone Other (See Comments)    Jittery, insomnia, intolerance   Penicillins Hives, Rash and Other (See Comments)    Felt like she was going to pass out.    Reviewed their medical, surgical, family, social, medication, and allergy history and updated chart as appropriate.   Review of Systems  Constitutional:  Negative for chills, fever, malaise/fatigue  and weight loss.  HENT:  Negative for congestion, ear pain, hearing loss, sore throat and tinnitus.   Eyes:  Negative for blurred vision, pain and redness.  Respiratory:  Negative for cough, hemoptysis and shortness of breath.   Cardiovascular:  Negative for chest pain, palpitations, orthopnea, claudication and leg swelling.  Gastrointestinal:  Negative for abdominal pain, blood in stool, constipation, diarrhea, nausea and vomiting.  Genitourinary:  Negative for dysuria, flank pain, frequency, hematuria and urgency.  Musculoskeletal:  Negative for falls, joint pain and myalgias.  Skin:  Negative for itching and rash.   Neurological:  Negative for dizziness, tingling, speech change, weakness and headaches.  Endo/Heme/Allergies:  Negative for polydipsia. Does not bruise/bleed easily.  Psychiatric/Behavioral:  Negative for depression and memory loss. The patient is not nervous/anxious and does not have insomnia.          12/14/2022    3:01 PM 12/11/2021   11:32 AM 12/10/2020    9:27 AM 12/10/2019    8:31 AM 12/07/2018    9:22 AM  Depression screen PHQ 2/9  Decreased Interest 0 0 0 0 0  Down, Depressed, Hopeless 0 0 0 1 1  PHQ - 2 Score 0 0 0 1 1  Altered sleeping 0 0     Tired, decreased energy 0 0     Change in appetite 0 0     Feeling bad or failure about yourself  0 0     Trouble concentrating 0 0     Moving slowly or fidgety/restless 0 0     Suicidal thoughts 0 0     PHQ-9 Score 0 0     Difficult doing work/chores Not difficult at all Not difficult at all          Objective:  BP 118/72   Pulse 89   Ht 5\' 7"  (1.702 m)   Wt 145 lb 9.6 oz (66 kg)   LMP 02/23/1992   BMI 22.80 kg/m   General appearance: alert, no distress, WD/WN, Caucasian female Skin: unremarkable Neck: supple, no lymphadenopathy, no thyromegaly, no masses, normal ROM, no bruits Chest: non tender, normal shape and expansion Heart: irregular, otherwise RR, normal S1, S2, no murmurs Lungs: CTA bilaterally, no wheezes, rhonchi, or rales Abdomen: +bs, soft, non tender, non distended, no masses, no hepatomegaly, no splenomegaly, no bruits Back: non tender Musculoskeletal:  no obvious deformity, normal ROM throughout Extremities: no edema, no cyanosis, no clubbing Pulses: 2+ symmetric, upper and lower extremities, normal cap refill Neurological: alert, oriented x 3, CN2-12 intact, strength normal upper extremities and lower extremities, sensation normal throughout, DTRs 2+ throughout, no cerebellar signs, gait normal Psychiatric: normal affect, behavior normal, pleasant  Breast/gyn/rectal - declined    Assessment  and Plan :   Encounter Diagnoses  Name Primary?   Stage 3a chronic kidney disease (HCC) Yes   Impaired fasting blood sugar    Hypothyroidism, unspecified type    Generalized anxiety disorder    Hypercholesteremia    PAD (peripheral artery disease) (HCC)    Vaccine refused by patient    Post-menopausal    Polycythemia, secondary    Permanent atrial fibrillation (HCC)    Osteoporosis, unspecified osteoporosis type, unspecified pathological fracture presence    Estrogen deficiency    Advanced directives, counseling/discussion    Screening mammogram for breast cancer      This visit was a preventative care visit, also known as wellness visit or routine physical.   Topics typically include healthy lifestyle, diet, exercise, preventative  care, vaccinations, sick and well care, proper use of emergency dept and after hours care, as well as other concerns.     Recommendations: Continue to return yearly for your annual wellness and preventative care visits.  This gives [place] a chance to discuss healthy lifestyle, exercise, vaccinations, review your chart record, and perform screenings where appropriate.  I recommend you see your eye doctor yearly for routine vision care.  I recommend you see your dentist yearly for routine dental care including hygiene visits twice yearly.   Vaccination recommendations were reviewed Immunization History  Administered Date(s) Administered   Fluad Quad(high Dose 65+) 12/10/2019   Influenza Split 12/28/2010   Influenza Whole 11/14/2009   Influenza, High Dose Seasonal PF 12/05/2017   Influenza, Seasonal, Injecte, Preservative Fre 03/03/2012   Influenza,inj,Quad PF,6+ Mos 11/17/2012, 11/22/2013   Influenza-Unspecified 12/02/2015   PFIZER(Purple Top)SARS-COV-2 Vaccination 05/13/2019, 05/28/2019   Pneumococcal Conjugate-13 09/01/2016   Pneumococcal Polysaccharide-23 12/05/2017   Tdap 08/01/2008    Consider updated tetanus booster and shingrix at your  pharmacy.  You decline vaccines given reaction to covid vaccine 2022.   Screening for cancer: Colon cancer screening: I reviewed your negative Cologuard on file that is up to date from 2023  Breast cancer screening: Get your mammogram every 1-2 years.  I reviewed your negative 2022 mammogram.  I recommend doing the mammo bus since you have phobia of stairs/elevators.  Skin cancer screening: Check your skin regularly for new changes, growing lesions, or other lesions of concern Come in for evaluation if you have skin lesions of concern.  Lung cancer screening: If you have a greater than 20 pack year history of tobacco use, then you may qualify for lung cancer screening with a chest CT scan.   Please call your insurance company to inquire about coverage for this test.  We currently don't have screenings for other cancers besides breast, cervical, colon, and lung cancers.  If you have a strong family history of cancer or have other cancer screening concerns, please let me know.    Bone health: Get at least 150 minutes of aerobic exercise weekly Get weight bearing exercise at least once weekly  Osteoporosis per 12/2020 scan.  I recommend you consider Evenity or Prolia medications where are injections we can do here at the office to help improve your outlook with bone density.  I recommend referral to endocrinology to discuss.  You decline this currently.   Heart health: Get at least 150 minutes of aerobic exercise weekly Limit alcohol It is important to maintain a healthy blood pressure and healthy cholesterol numbers  Heart disease screening: Screening for heart disease includes screening for blood pressure, fasting lipids, glucose/diabetes screening, BMI height to weight ratio, reviewed of smoking status, physical activity, and diet.    Goals include blood pressure 120/80 or less, maintaining a healthy lipid/cholesterol profile, preventing diabetes or keeping diabetes numbers under  good control, not smoking or using tobacco products, exercising most days per week or at least 150 minutes per week of exercise, and eating healthy variety of fruits and vegetables, healthy oils, and avoiding unhealthy food choices like fried food, fast food, high sugar and high cholesterol foods.    Continue routine follow up with cardiology    Medical care options: I recommend you continue to seek care here first for routine care.  We try really hard to have available appointments Monday through Friday daytime hours for sick visits, acute visits, and physicals.  Urgent care should be used for  after hours and weekends for significant issues that cannot wait till the next day.  The emergency department should be used for significant potentially life-threatening emergencies.  The emergency department is expensive, can often have long wait times for less significant concerns, so try to utilize primary care, urgent care, or telemedicine when possible to avoid unnecessary trips to the emergency department.  Virtual visits and telemedicine have been introduced since the pandemic started in 2020, and can be convenient ways to receive medical care.  We offer virtual appointments as well to assist you in a variety of options to seek medical care.   Advanced Directives: I recommend you consider completing a Health Care Power of Attorney and Living Will.   These documents respect your wishes and help alleviate burdens on your loved ones if you were to become terminally ill or be in a position to need those documents enforced.    You can complete Advanced Directives yourself, have them notarized, then have copies made for our office, for you and for anybody you feel should have them in safe keeping.  Or, you can have an attorney prepare these documents.   If you haven't updated your Last Will and Testament in a while, it may be worthwhile having an attorney prepare these documents together and save on some  costs.       I encouraged you to work on completing advanced directives.     Separate significant issues discussed: hypothyroidism-continue current medication, levothyroxine 100 mcg daily, routine labs today  Hyperlipidemia-she has declined statins in the past.  Cardiology also talked her about maybe trying something else at her last visit this year but she declined.  Peripheral arterial disease-continue walking and regular exercise.  Consider statin or other lipid-lowering medication  Osteoporosis-I recommend referral to endocrinology for consult  Generalized anxiety disorder-she avoids elevators, escalators, she will not use a building this greater than 1 floor.  She has declined counseling in the past.  Not on any medication in particular for this  Impaired glucose-updated labs today  Permanent A-fib-sees cardiology, has declined cardioversion in the past.  Currently compliant with metoprolol and Xarelto  Polycythemia-stable, I have reviewed hematology notes in this past year.  She will see them periodically for follow-up  Keyani was seen today for medical management of chronic issues.  Diagnoses and all orders for this visit:  Stage 3a chronic kidney disease (HCC) -     Comprehensive metabolic panel -     Cancel: POCT Urinalysis DIP (Proadvantage Device)  Impaired fasting blood sugar -     Hemoglobin A1c  Hypothyroidism, unspecified type -     TSH + free T4  Generalized anxiety disorder  Hypercholesteremia -     Lipid panel  PAD (peripheral artery disease) (HCC)  Vaccine refused by patient  Post-menopausal  Polycythemia, secondary -     CBC  Permanent atrial fibrillation (HCC)  Osteoporosis, unspecified osteoporosis type, unspecified pathological fracture presence  Estrogen deficiency  Advanced directives, counseling/discussion  Screening mammogram for breast cancer -     Cancel: MM 3D SCREENING MAMMOGRAM BILATERAL BREAST  Spent > 45 minutes face to  face with patient in discussion of symptoms, evaluation, plan and recommendations.     Follow-up pending labs, yearly for physical

## 2022-12-22 NOTE — Patient Instructions (Signed)
This visit was a preventative care visit, also known as wellness visit or routine physical.   Topics typically include healthy lifestyle, diet, exercise, preventative care, vaccinations, sick and well care, proper use of emergency dept and after hours care, as well as other concerns.     Recommendations: Continue to return yearly for your annual wellness and preventative care visits.  This gives [place] a chance to discuss healthy lifestyle, exercise, vaccinations, review your chart record, and perform screenings where appropriate.  I recommend you see your eye doctor yearly for routine vision care.  I recommend you see your dentist yearly for routine dental care including hygiene visits twice yearly.   Vaccination recommendations were reviewed Immunization History  Administered Date(s) Administered   Fluad Quad(high Dose 65+) 12/10/2019   Influenza Split 12/28/2010   Influenza Whole 11/14/2009   Influenza, High Dose Seasonal PF 12/05/2017   Influenza, Seasonal, Injecte, Preservative Fre 03/03/2012   Influenza,inj,Quad PF,6+ Mos 11/17/2012, 11/22/2013   Influenza-Unspecified 12/02/2015   PFIZER(Purple Top)SARS-COV-2 Vaccination 05/13/2019, 05/28/2019   Pneumococcal Conjugate-13 09/01/2016   Pneumococcal Polysaccharide-23 12/05/2017   Tdap 08/01/2008    Consider updated tetanus booster and shingrix at your pharmacy.  You decline vaccines given reaction to covid vaccine 2022.   Screening for cancer: Colon cancer screening: I reviewed your negative Cologuard on file that is up to date from 2023  Breast cancer screening: Get your mammogram every 1-2 years.  I reviewed your negative 2022 mammogram.  I recommend doing the mammo bus since you have phobia of stairs/elevators.  Skin cancer screening: Check your skin regularly for new changes, growing lesions, or other lesions of concern Come in for evaluation if you have skin lesions of concern.  Lung cancer screening: If you have a  greater than 20 pack year history of tobacco use, then you may qualify for lung cancer screening with a chest CT scan.   Please call your insurance company to inquire about coverage for this test.  We currently don't have screenings for other cancers besides breast, cervical, colon, and lung cancers.  If you have a strong family history of cancer or have other cancer screening concerns, please let me know.    Bone health: Get at least 150 minutes of aerobic exercise weekly Get weight bearing exercise at least once weekly  Osteoporosis per 12/2020 scan.  I recommend you consider Evenity or Prolia medications where are injections we can do here at the office to help improve your outlook with bone density.  I recommend referral to endocrinology to discuss.  You decline this currently.   Heart health: Get at least 150 minutes of aerobic exercise weekly Limit alcohol It is important to maintain a healthy blood pressure and healthy cholesterol numbers  Heart disease screening: Screening for heart disease includes screening for blood pressure, fasting lipids, glucose/diabetes screening, BMI height to weight ratio, reviewed of smoking status, physical activity, and diet.    Goals include blood pressure 120/80 or less, maintaining a healthy lipid/cholesterol profile, preventing diabetes or keeping diabetes numbers under good control, not smoking or using tobacco products, exercising most days per week or at least 150 minutes per week of exercise, and eating healthy variety of fruits and vegetables, healthy oils, and avoiding unhealthy food choices like fried food, fast food, high sugar and high cholesterol foods.    Continue routine follow up with cardiology    Medical care options: I recommend you continue to seek care here first for routine care.  We try  really hard to have available appointments Monday through Friday daytime hours for sick visits, acute visits, and physicals.  Urgent care should  be used for after hours and weekends for significant issues that cannot wait till the next day.  The emergency department should be used for significant potentially life-threatening emergencies.  The emergency department is expensive, can often have long wait times for less significant concerns, so try to utilize primary care, urgent care, or telemedicine when possible to avoid unnecessary trips to the emergency department.  Virtual visits and telemedicine have been introduced since the pandemic started in 2020, and can be convenient ways to receive medical care.  We offer virtual appointments as well to assist you in a variety of options to seek medical care.   Advanced Directives: I recommend you consider completing a Health Care Power of Attorney and Living Will.   These documents respect your wishes and help alleviate burdens on your loved ones if you were to become terminally ill or be in a position to need those documents enforced.    You can complete Advanced Directives yourself, have them notarized, then have copies made for our office, for you and for anybody you feel should have them in safe keeping.  Or, you can have an attorney prepare these documents.   If you haven't updated your Last Will and Testament in a while, it may be worthwhile having an attorney prepare these documents together and save on some costs.       I encouraged you to work on completing advanced directives.     Separate significant issues discussed: hypothyroidism-continue current medication, levothyroxine 100 mcg daily, routine labs today  Hyperlipidemia-she has declined statins in the past.  Cardiology also talked her about maybe trying something else at her last visit this year but she declined.  Peripheral arterial disease-continue walking and regular exercise.  Consider statin or other lipid-lowering medication  Osteoporosis-I recommend referral to endocrinology for consult  Generalized anxiety disorder-she  avoids elevators, escalators, she will not use a building this greater than 1 floor.  She has declined counseling in the past.  Not on any medication in particular for this  Impaired glucose-updated labs today  Permanent A-fib-sees cardiology, has declined cardioversion in the past.  Currently compliant with metoprolol and Xarelto  Polycythemia-stable, I have reviewed hematology notes in this past year.  She will see them periodically for follow-up

## 2022-12-23 ENCOUNTER — Other Ambulatory Visit: Payer: Self-pay | Admitting: Medical

## 2022-12-23 LAB — CBC
Hematocrit: 48.4 % — ABNORMAL HIGH (ref 34.0–46.6)
Hemoglobin: 15.6 g/dL (ref 11.1–15.9)
MCH: 31.5 pg (ref 26.6–33.0)
MCHC: 32.2 g/dL (ref 31.5–35.7)
MCV: 98 fL — ABNORMAL HIGH (ref 79–97)
Platelets: 143 10*3/uL — ABNORMAL LOW (ref 150–450)
RBC: 4.95 x10E6/uL (ref 3.77–5.28)
RDW: 12.1 % (ref 11.7–15.4)
WBC: 4.9 10*3/uL (ref 3.4–10.8)

## 2022-12-23 LAB — HEMOGLOBIN A1C
Est. average glucose Bld gHb Est-mCnc: 120 mg/dL
Hgb A1c MFr Bld: 5.8 % — ABNORMAL HIGH (ref 4.8–5.6)

## 2022-12-23 LAB — COMPREHENSIVE METABOLIC PANEL
ALT: 43 IU/L — ABNORMAL HIGH (ref 0–32)
AST: 34 [IU]/L (ref 0–40)
Albumin: 3.9 g/dL (ref 3.8–4.8)
Alkaline Phosphatase: 85 IU/L (ref 44–121)
BUN/Creatinine Ratio: 48 — ABNORMAL HIGH (ref 12–28)
BUN: 47 mg/dL — ABNORMAL HIGH (ref 8–27)
Bilirubin Total: 0.6 mg/dL (ref 0.0–1.2)
CO2: 24 mmol/L (ref 20–29)
Calcium: 9.8 mg/dL (ref 8.7–10.3)
Chloride: 103 mmol/L (ref 96–106)
Creatinine, Ser: 0.98 mg/dL (ref 0.57–1.00)
Globulin, Total: 2.7 g/dL (ref 1.5–4.5)
Glucose: 92 mg/dL (ref 70–99)
Potassium: 4.4 mmol/L (ref 3.5–5.2)
Sodium: 140 mmol/L (ref 134–144)
Total Protein: 6.6 g/dL (ref 6.0–8.5)
eGFR: 61 mL/min/{1.73_m2} (ref >59)

## 2022-12-23 LAB — TSH+FREE T4
Free T4: 1.26 ng/dL (ref 0.82–1.77)
TSH: 4.97 u[IU]/mL — ABNORMAL HIGH (ref 0.450–4.500)

## 2022-12-23 LAB — LIPID PANEL
Chol/HDL Ratio: 2.6 ratio (ref 0.0–4.4)
Cholesterol, Total: 247 mg/dL — ABNORMAL HIGH (ref 100–199)
HDL: 95 mg/dL (ref >39)
LDL Chol Calc (NIH): 137 mg/dL — ABNORMAL HIGH (ref 0–99)
Triglycerides: 87 mg/dL (ref 0–149)
VLDL Cholesterol Cal: 15 mg/dL (ref 5–40)

## 2022-12-23 MED ORDER — LEVOTHYROXINE SODIUM 112 MCG PO TABS: 112.0000 ug | ORAL_TABLET | Freq: Every day | ORAL | 1 refills | Status: DC

## 2022-12-23 MED ORDER — VITAMIN D 25 MCG (1000 UNIT) PO TABS: 1000.0000 [IU] | ORAL_TABLET | Freq: Every day | ORAL | 3 refills | Status: DC

## 2022-12-23 NOTE — Progress Notes (Signed)
Labs show elevated liver test.  It is mildly elevated similar to in the past.  He is in the past had normal liver ultrasound in 2022, negative hepatitis infection screening in 2020, prior normal iron levels.  At this point it is mildly elevated but stable.  So it is unclear exactly why your liver test is mildly elevated but if you would like to do any further testing on this I will refer you to the gastroenterologist/liver doctor.  Your cholesterol continues to be elevated putting at risk for cholesterol buildup in the arteries, after risk versus.  You have declined medication prior.  If you want to pursue any medication other than the statin that you have declined, we can certainly do a trial of something.  Let me know if you are interested in this or not.  This would be for prevention of future buildup of cholesterol plaques in your arteries.  This is a stroke and heart disease measure  Your thyroid was a little under corrected so I increased your thyroid dose to 112 mcg daily.  We can plan on rechecking your thyroid labs in 4 to 6 months.  Diabetes marker still at risk for diabetes but stable.  Platelets are slightly low but stable.  We will continue to monitor blood work from time to time  If you wish to see specialist about osteoporosis treatment options let me know and I will refer.  I recommend you and your husband work on advanced directives, healthcare power of attorney and living will if you have not done so.  Follow up in 4-6 months

## 2023-01-26 ENCOUNTER — Other Ambulatory Visit: Payer: Self-pay | Admitting: *Deleted

## 2023-01-26 DIAGNOSIS — D751 Secondary polycythemia: Secondary | ICD-10-CM

## 2023-01-28 ENCOUNTER — Inpatient Hospital Stay: Payer: Medicare Other | Attending: Hematology and Oncology | Admitting: Hematology and Oncology

## 2023-01-28 ENCOUNTER — Inpatient Hospital Stay: Payer: Medicare Other

## 2023-01-28 VITALS — BP 113/79 | HR 95 | Temp 97.3°F | Resp 16 | Wt 147.1 lb

## 2023-01-28 DIAGNOSIS — I4821 Permanent atrial fibrillation: Secondary | ICD-10-CM | POA: Diagnosis not present

## 2023-01-28 DIAGNOSIS — D751 Secondary polycythemia: Secondary | ICD-10-CM | POA: Diagnosis not present

## 2023-01-28 DIAGNOSIS — Z7901 Long term (current) use of anticoagulants: Secondary | ICD-10-CM | POA: Insufficient documentation

## 2023-01-28 DIAGNOSIS — Z87891 Personal history of nicotine dependence: Secondary | ICD-10-CM | POA: Diagnosis not present

## 2023-01-28 LAB — CBC WITH DIFFERENTIAL (CANCER CENTER ONLY)
Abs Immature Granulocytes: 0.01 10*3/uL (ref 0.00–0.07)
Basophils Absolute: 0 10*3/uL (ref 0.0–0.1)
Basophils Relative: 1 %
Eosinophils Absolute: 0.2 10*3/uL (ref 0.0–0.5)
Eosinophils Relative: 3 %
HCT: 48.3 % — ABNORMAL HIGH (ref 36.0–46.0)
Hemoglobin: 15.6 g/dL — ABNORMAL HIGH (ref 12.0–15.0)
Immature Granulocytes: 0 %
Lymphocytes Relative: 20 %
Lymphs Abs: 1.1 10*3/uL (ref 0.7–4.0)
MCH: 31 pg (ref 26.0–34.0)
MCHC: 32.3 g/dL (ref 30.0–36.0)
MCV: 96 fL (ref 80.0–100.0)
Monocytes Absolute: 0.4 10*3/uL (ref 0.1–1.0)
Monocytes Relative: 8 %
Neutro Abs: 3.7 10*3/uL (ref 1.7–7.7)
Neutrophils Relative %: 68 %
Platelet Count: 149 10*3/uL — ABNORMAL LOW (ref 150–400)
RBC: 5.03 MIL/uL (ref 3.87–5.11)
RDW: 13.1 % (ref 11.5–15.5)
WBC Count: 5.3 10*3/uL (ref 4.0–10.5)
nRBC: 0 % (ref 0.0–0.2)

## 2023-01-28 NOTE — Progress Notes (Signed)
[name] CONSULT NOTE  Patient Care Team: Tysinger, Kermit Balo, PA-C as PCP - General (Family Medicine) Charlott Rakes, MD as Consulting Physician (Gastroenterology) Luciana Axe Alford Highland, MD as Consulting Physician (Ophthalmology) Yates Decamp, MD as Consulting Physician (Cardiology) Verlee Monte, MD as Consulting Physician (Allergy and Immunology)  CHIEF COMPLAINTS/PURPOSE OF CONSULTATION:  Polycythemia  ASSESSMENT & PLAN:   This is a very pleasant 72 year old female patient with past medical history significant for atrial fibrillation, cardiomyopathy referred to hematology for polycythemia, 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  Polycythemia Stable hemoglobin at 15.6, slightly lower than six months ago. 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  -Consider blood donation a couple of times a year if tolerated. She is reluctant given her multiple health issues, In that case we will continue to monitor. -Continue Xarelto as prescribed.  Atrial Fibrillation Patient reports history of AFib, confirmed on auscultation. -Continue Xarelto as prescribed.  Follow-up in 6 months to monitor hemoglobin levels and overall health.  HISTORY OF PRESENTING ILLNESS:  Rita Wright 72 y.o. female is here because of polycythemia.  This is a very pleasant 72 year old female patient with past medical history significant for A-fib, dilated cardiomyopathy followed by cardiology who was referred for evaluation of polycythemia.    The patient, with a history of high hemoglobin, presents for a routine follow-up. She reports no major health changes or new medications since the last visit. 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 The patient has never donated blood and has concerns about doing so due to a history of low blood sugar. She also reports occasional dizziness upon  standing. The patient is currently taking Xarelto. She otherwise reports no new changes. Rest of the pertinent 10 point ROS reviewed and neg.  MEDICAL HISTORY:  Past Medical History:  Diagnosis Date   Colon cancer screening 09/2014   Cologuard test negative    Depression    Dilated cardiomyopathy (HCC) 05/2014   Dr. Sherril Croon   Echocardiogram abnormal 05/2014   mild dilation of Left Ventricle; Dr. Sherril Croon   Hashimoto's thyroiditis    History of mammogram 10/10/2014   normal   Multinodular goiter    Noncompliance with diagnostic testing    due to severe anxiety   Osteoporosis    Panic disorder    severe, doesn't drive, doesn't use stairs or elevators, severely limited by her anxiety   Permanent atrial fibrillation (HCC) 08/22/2017   CHA2DS2-VASCScore: Risk Score 3,  Yearly risk of stroke  3.2. Recommendation: Anticoagulation                Renal insufficiency    Thyroid nodule 10/22/2014   benign follicular nodule on biopsy    SURGICAL HISTORY: Past Surgical History:  Procedure Laterality Date   COLONOSCOPY  2014   cologuard 2016; refuses colonoscopy   TUBAL LIGATION  1986    SOCIAL HISTORY: Social History   Socioeconomic History   Marital status: Married    Spouse name: Not on file   Number of children: 2   Years of education: Not on file   Highest education level: Associate degree: occupational, Scientist, product/process development, or vocational program  Occupational History   Not on file  Tobacco Use   Smoking status: Former    Current packs/day: 0.00    Average packs/day: 1.5 packs/day for 35.0 years (52.5 ttl pk-yrs)    Types:  Cigarettes    Start date: 02/22/1957    Quit date: 02/23/1992    Years since quitting: 30.9   Smokeless tobacco: Never  Vaping Use   Vaping status: Never Used  Substance and Sexual Activity   Alcohol use: No   Drug use: No   Sexual activity: Not Currently  Other Topics Concern   Not on file  Social History Narrative   Married.  Exercise with walking her dog,  gardening.      Social Determinants of Health   Financial Resource Strain: Low Risk  (12/21/2022)   Overall Financial Resource Strain (CARDIA)    Difficulty of Paying Living Expenses: Not very hard  Food Insecurity: No Food Insecurity (12/21/2022)   Hunger Vital Sign    Worried About Running Out of Food in the Last Year: Never true    Ran Out of Food in the Last Year: Never true  Transportation Needs: No Transportation Needs (12/21/2022)   PRAPARE - Administrator, Civil Service (Medical): No    Lack of Transportation (Non-Medical): No  Physical Activity: Inactive (12/21/2022)   Exercise Vital Sign    Days of Exercise per Week: 0 days    Minutes of Exercise per Session: 0 min  Stress: No Stress Concern Present (12/21/2022)   [employer] of Occupational Health - Occupational Stress Questionnaire    Feeling of Stress : Only a little  Social Connections: Moderately Isolated (12/21/2022)   Social Connection and Isolation Panel [NHANES]    Frequency of Communication with Friends and Family: More than three times a week    Frequency of Social Gatherings with Friends and Family: Twice a week    Attends Religious Services: Never    Database administrator or Organizations: No    Attends Banker Meetings: Never    Marital Status: Married  Catering manager Violence: Not At Risk (12/14/2022)   Humiliation, Afraid, Rape, and Kick questionnaire    Fear of Current or Ex-Partner: No    Emotionally Abused: No    Physically Abused: No    Sexually Abused: No    FAMILY HISTORY: Family History  Problem Relation Age of Onset   Hypertension Mother    Parkinson's disease Father    Heart failure Sister    Hypertension Brother    Cerebral aneurysm Sister    Throat cancer Brother     ALLERGIES:  is allergic to valsartan, boniva [ibandronic acid], covid-19 (mrna) vaccine, erythromycin, fosamax [alendronate], prednisone, and penicillins.  MEDICATIONS:  Current  Outpatient Medications  Medication Sig Dispense Refill   cholecalciferol (VITAMIN D3) 25 MCG (1000 UNIT) tablet Take 1 tablet (1,000 Units total) by mouth daily. 90 tablet 3   folic acid (FOLVITE) 1 MG tablet Take 1 tablet (1 mg total) by mouth daily. 90 tablet 3   levothyroxine (SYNTHROID) 112 MCG tablet Take 1 tablet (112 mcg total) by mouth daily. 90 tablet 1   metoprolol tartrate (LOPRESSOR) 50 MG tablet Take 1 tablet (50 mg total) by mouth 2 (two) times daily. 180 tablet 3   Rivaroxaban (XARELTO) 15 MG TABS tablet Take 1 tablet (15 mg total) by mouth daily. 90 tablet 3   No current facility-administered medications for this visit.     PHYSICAL EXAMINATION: ECOG PERFORMANCE STATUS: 0 - Asymptomatic  Vitals:   01/28/23 0908  BP: 113/79  Pulse: 95  Resp: 16  Temp: (!) 97.3 F (36.3 C)  SpO2: 98%    Filed Weights   01/28/23 0908  Weight: 147 lb 1.6 oz (66.7 kg)   Physical Exam Constitutional:      Appearance: Normal appearance.  Cardiovascular:     Pulses: Normal pulses.     Heart sounds: Normal heart sounds.  Pulmonary:     Effort: Pulmonary effort is normal.     Breath sounds: Normal breath sounds.  Musculoskeletal:        General: Normal range of motion.     Cervical back: Normal range of motion and neck supple. No rigidity.  Lymphadenopathy:     Cervical: No cervical adenopathy.  Skin:    General: Skin is warm and dry.  Neurological:     General: No focal deficit present.     Mental Status: She is alert.  Psychiatric:        Mood and Affect: Mood normal.    LABORATORY DATA:  I have reviewed the data as listed Lab Results  Component Value Date   WBC 5.3 01/28/2023   HGB 15.6 (H) 01/28/2023   HCT 48.3 (H) 01/28/2023   MCV 96.0 01/28/2023   PLT 149 (L) 01/28/2023     Chemistry      Component Value Date/Time   NA 140 12/22/2022 0900   K 4.4 12/22/2022 0900   CL 103 12/22/2022 0900   CO2 24 12/22/2022 0900   BUN 47 (H) 12/22/2022 0900   CREATININE  0.98 12/22/2022 0900   CREATININE 0.87 09/01/2016 0818      Component Value Date/Time   CALCIUM 9.8 12/22/2022 0900   ALKPHOS 85 12/22/2022 0900   AST 34 12/22/2022 0900   ALT 43 (H) 12/22/2022 0900   BILITOT 0.6 12/22/2022 0900       RADIOGRAPHIC STUDIES: I have personally reviewed the radiological images as listed and agreed with the findings in the report. No results found.  I discussed the limitations of evaluation and management by telemedicine. The patient expressed understanding and agreed to proceed.     Rachel Moulds, MD 01/28/2023 9:15 AM

## 2023-02-11 IMAGING — US US ABDOMEN LIMITED
1 series · 14 of 25 positions shown · non-contrast
Comparison: None.

CLINICAL DATA: Elevated LFTs.

EXAM:
ULTRASOUND ABDOMEN LIMITED RIGHT UPPER QUADRANT

[Series 1: us abdomen limited · 0.15mm/px · 14 of 45 slices shown]
[im 1/45]
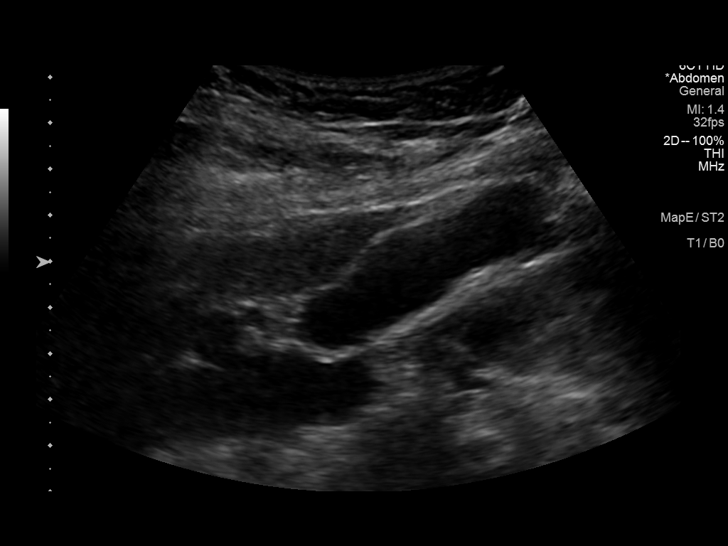
[im 4/45]
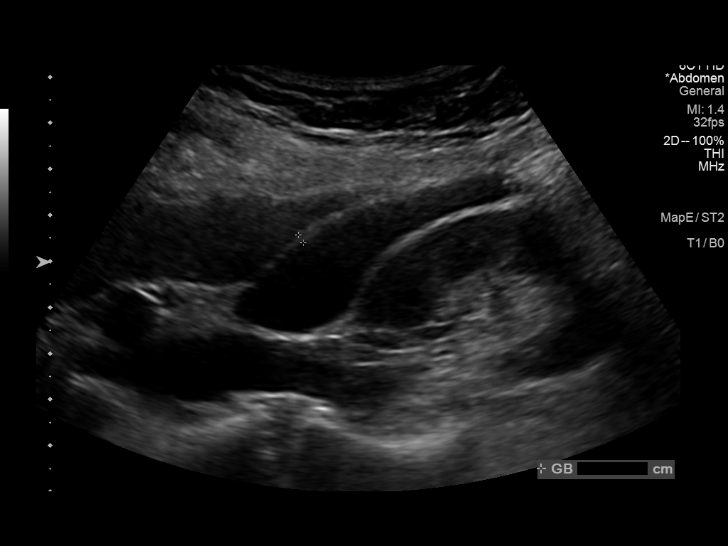
[im 8/45]
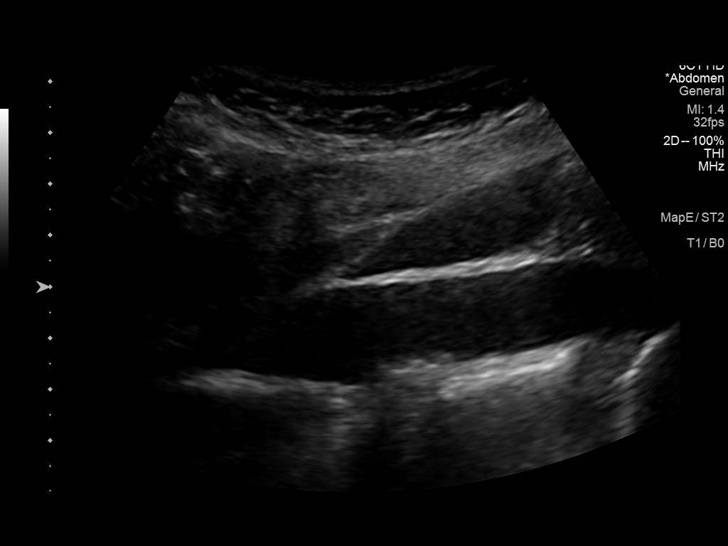
[im 12/45]
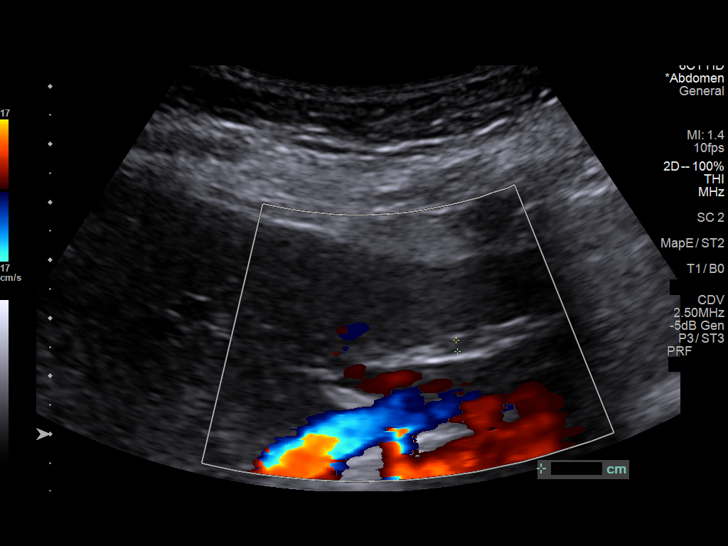
[im 15/45]
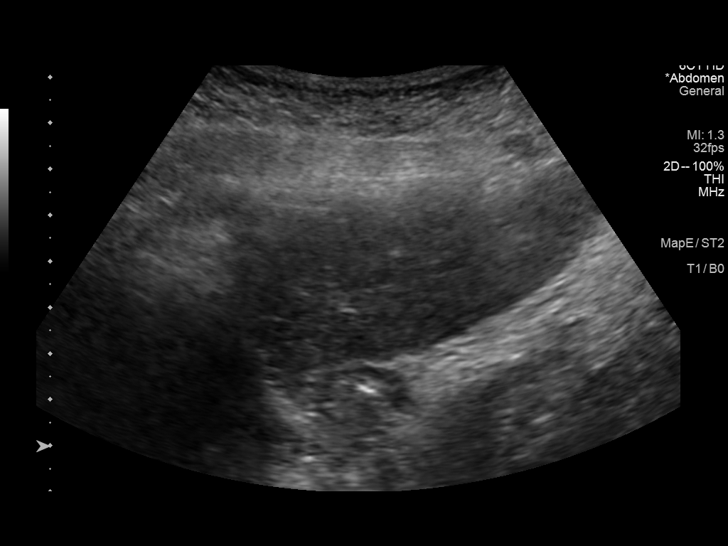
[im 17/45]
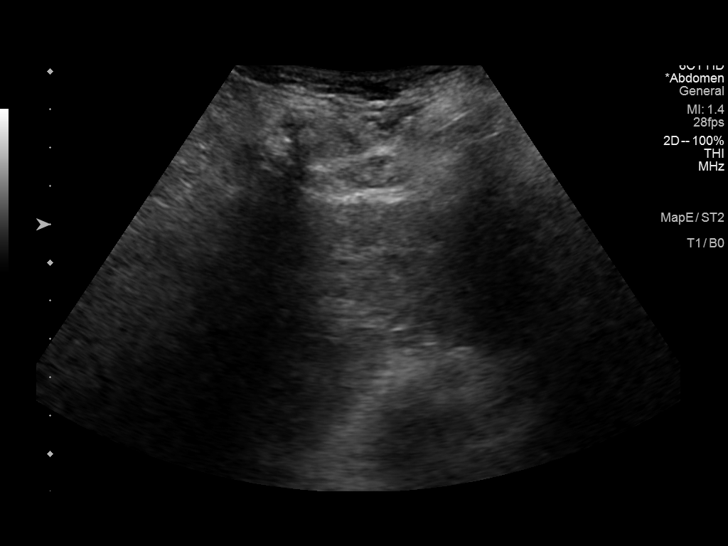
[im 21/45]
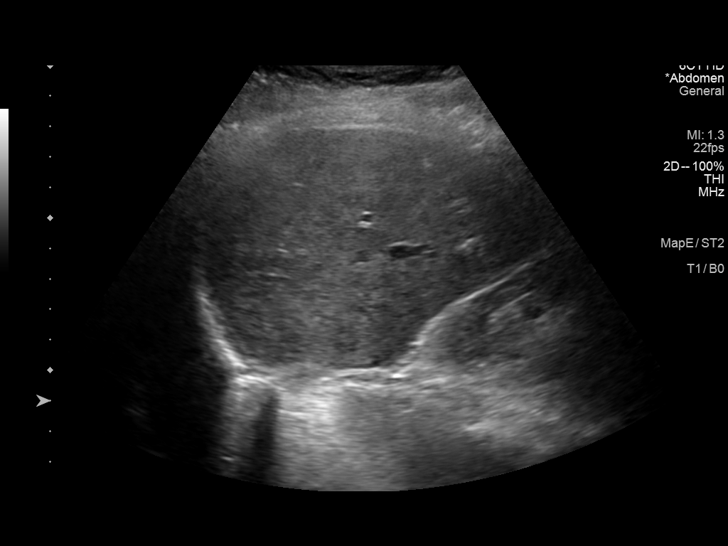
[im 24/45]
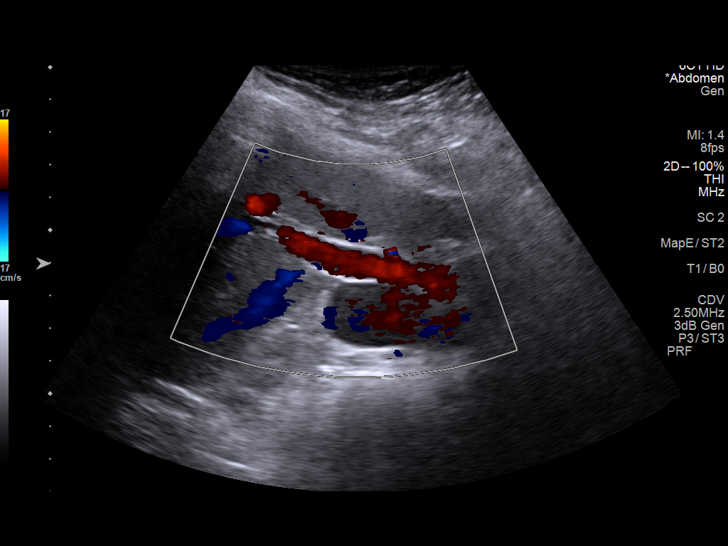
[im 28/45]
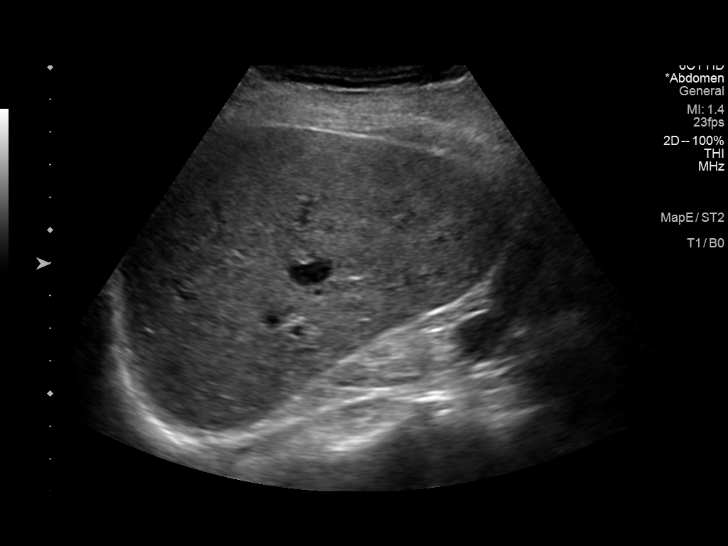
[im 30/45]
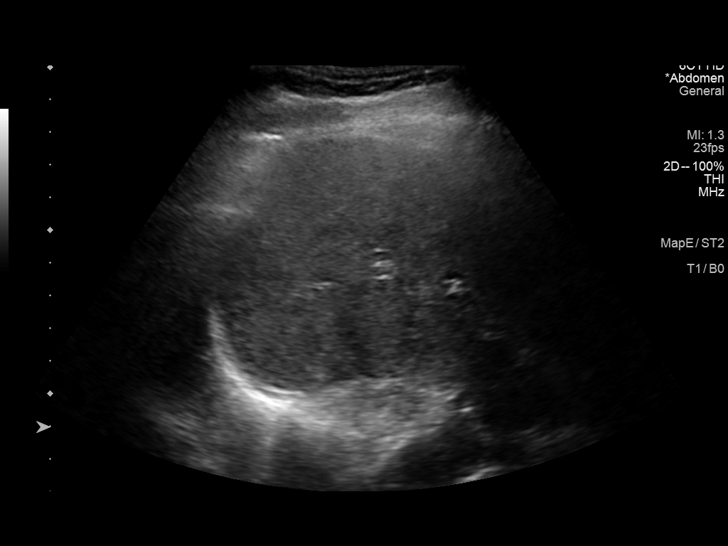
[im 34/45]
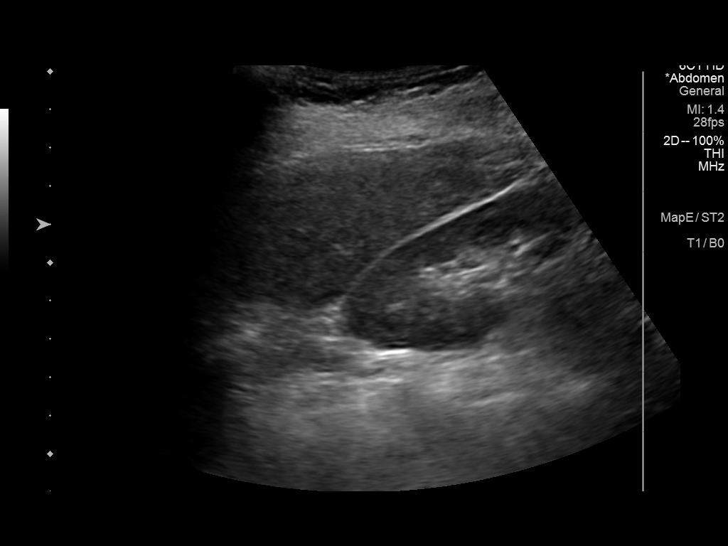
[im 37/45]
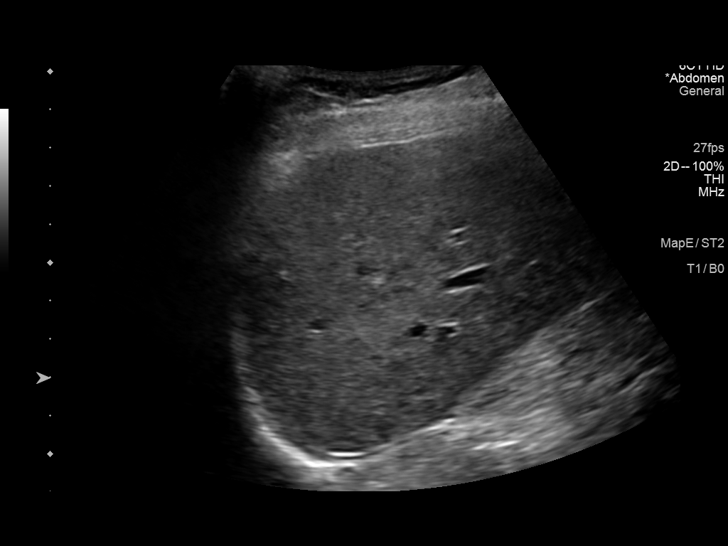
[im 41/45]
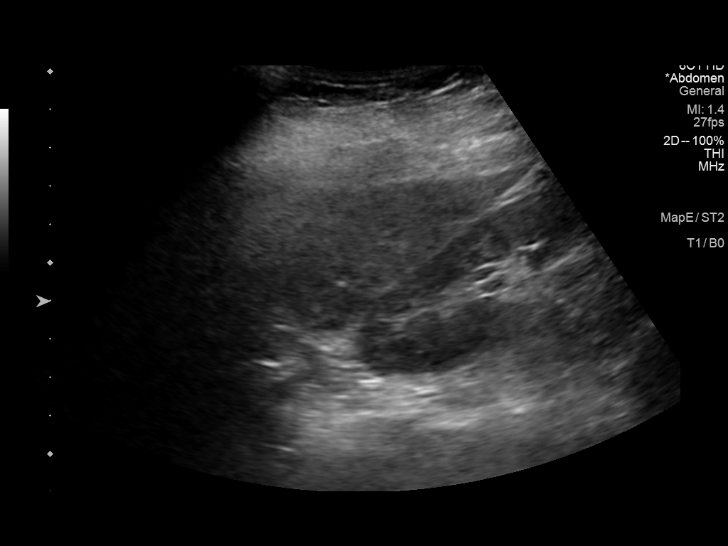
[im 45/45]
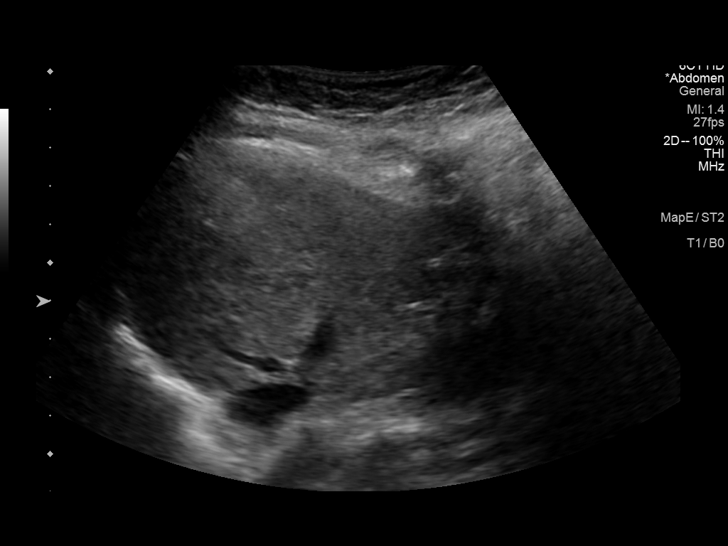

[14 of 25 positions shown; findings below may reference images not displayed]

FINDINGS: Gallbladder:

No gallstones or wall thickening visualized. No sonographic Murphy
sign noted by sonographer.

Common bile duct:

Diameter: 2 mm

Liver:

No focal lesion identified. Within normal limits in parenchymal
echogenicity. Portal vein is patent on color Doppler imaging with
normal direction of blood flow towards the liver.

Other: None.
IMPRESSION: Unremarkable right upper quadrant ultrasound.

## 2023-02-24 ENCOUNTER — Other Ambulatory Visit: Payer: Self-pay | Admitting: Cardiology

## 2023-02-24 ENCOUNTER — Telehealth: Payer: Self-pay | Admitting: Medical

## 2023-02-24 DIAGNOSIS — I428 Other cardiomyopathies: Secondary | ICD-10-CM

## 2023-02-24 DIAGNOSIS — I4821 Permanent atrial fibrillation: Secondary | ICD-10-CM

## 2023-02-24 DIAGNOSIS — E039 Hypothyroidism, unspecified: Secondary | ICD-10-CM

## 2023-02-24 DIAGNOSIS — E2839 Other primary ovarian failure: Secondary | ICD-10-CM

## 2023-02-24 MED ORDER — FOLIC ACID 1 MG PO TABS: 1.0000 mg | ORAL_TABLET | Freq: Every day | ORAL | 0 refills | Status: DC

## 2023-02-24 MED ORDER — METOPROLOL TARTRATE 50 MG PO TABS: 50.0000 mg | ORAL_TABLET | Freq: Two times a day (BID) | ORAL | 0 refills | Status: DC

## 2023-02-24 MED ORDER — LEVOTHYROXINE SODIUM 112 MCG PO TABS: 112.0000 ug | ORAL_TABLET | Freq: Every day | ORAL | 1 refills | Status: DC

## 2023-02-24 NOTE — Telephone Encounter (Signed)
 Pt is requesting [facility]

## 2023-02-24 NOTE — Telephone Encounter (Signed)
 Prescription refill request for Xarelto  received.  Indication: AF Last office visit: 04/13/22  JINNY Bergamo MD Weight: 33xh Age: 73 Scr: 0.98 on 12/22/22  Epic CrCl: 52.47  Based on above findings Xarelto  20mg  daily is the appropriate dose. Pt is taking Xarelto  15mg  daily.  Previous elevated Scr.  Message sent to PharmD to advise on dosage.

## 2023-02-24 NOTE — Telephone Encounter (Signed)
 Pt needs levothyroxine refill  Meds by mail

## 2023-02-24 NOTE — Telephone Encounter (Signed)
*  STAT* If patient is at the pharmacy, call can be transferred to refill team.   1. Which medications need to be refilled? (please list name of each medication and dose if known) metoprolol  tartrate (LOPRESSOR ) 50 MG tablet   Rivaroxaban  (XARELTO ) 15 MG TABS tablet  folic acid  (FOLVITE ) 1 MG tablet   2. Which pharmacy/location (including street and city if local pharmacy) is medication to be sent to?  [facility] - [place], [place] - [address] [facility] [facility]    3. Do they need a 30 day or 90 day supply? 90

## 2023-02-25 MED ORDER — RIVAROXABAN 20 MG PO TABS: 20.0000 mg | ORAL_TABLET | Freq: Every day | ORAL | 1 refills | Status: AC

## 2023-02-25 NOTE — Telephone Encounter (Signed)
 Pt notified of dose adjustment and new Rx sent to pharmacy.

## 2023-03-07 ENCOUNTER — Other Ambulatory Visit: Payer: Self-pay | Admitting: Cardiology

## 2023-03-07 DIAGNOSIS — E039 Hypothyroidism, unspecified: Secondary | ICD-10-CM

## 2023-03-07 DIAGNOSIS — E2839 Other primary ovarian failure: Secondary | ICD-10-CM

## 2023-03-10 ENCOUNTER — Telehealth: Payer: Self-pay | Admitting: Medical

## 2023-03-10 NOTE — Telephone Encounter (Signed)
Pt needs folic acid sent to mail order pharmacy It was sent on 1/2 but no refills Pt asked to be notified when sent

## 2023-03-14 NOTE — Telephone Encounter (Signed)
Done by Jacinto Halim, not [place].

## 2023-03-15 ENCOUNTER — Other Ambulatory Visit: Payer: Self-pay | Admitting: Cardiology

## 2023-03-15 DIAGNOSIS — E039 Hypothyroidism, unspecified: Secondary | ICD-10-CM

## 2023-03-15 DIAGNOSIS — I428 Other cardiomyopathies: Secondary | ICD-10-CM

## 2023-03-15 DIAGNOSIS — E2839 Other primary ovarian failure: Secondary | ICD-10-CM

## 2023-03-15 DIAGNOSIS — I4821 Permanent atrial fibrillation: Secondary | ICD-10-CM

## 2023-03-21 ENCOUNTER — Telehealth: Payer: Self-pay | Admitting: Medical

## 2023-03-21 ENCOUNTER — Other Ambulatory Visit: Payer: Self-pay | Admitting: Medical

## 2023-03-21 ENCOUNTER — Other Ambulatory Visit: Payer: Self-pay | Admitting: Cardiology

## 2023-03-21 DIAGNOSIS — E039 Hypothyroidism, unspecified: Secondary | ICD-10-CM

## 2023-03-21 DIAGNOSIS — E2839 Other primary ovarian failure: Secondary | ICD-10-CM

## 2023-03-21 MED ORDER — FOLIC ACID 1 MG PO TABS: 1.0000 mg | ORAL_TABLET | Freq: Every day | ORAL | 0 refills | Status: DC

## 2023-03-21 NOTE — Telephone Encounter (Signed)
Pt called and states that she needs a refill of her folic acid sent in, she states that the cardio does not fill this , you do,  Pt  needs it to go to the [facility] - [place], [place] - [address] [facility]  States the one that was sent out jan the 2nd was lost

## 2023-04-07 DIAGNOSIS — H2513 Age-related nuclear cataract, bilateral: Secondary | ICD-10-CM | POA: Diagnosis not present

## 2023-04-07 DIAGNOSIS — D3131 Benign neoplasm of right choroid: Secondary | ICD-10-CM | POA: Diagnosis not present

## 2023-04-14 ENCOUNTER — Ambulatory Visit: Payer: Medicare Other | Admitting: Cardiology

## 2023-04-14 ENCOUNTER — Ambulatory Visit: Payer: Self-pay | Admitting: Cardiology

## 2023-06-14 NOTE — Progress Notes (Signed)
 " Cardiology Office Note:  .   Date:  06/14/2023  ID:  Rita Wright, DOB [birth year], MRN [id number] PCP: Bulah Alm GORMAN DEVONNA  [facility] Providers Cardiologist:  None   History of Present Illness: .   Rita Wright is a 73 y.o. female  with hashimoto's thyroiditis, osteoporosis, former tobacco use, chronically elevated LFTs, stage III chronic kidney disease, non-ischemic cardiomyopathy (nonischemic nuclear stress test in 2019) with severe LV systolic dysfunction and permanent atrial fibrillation, on medical therapy LVEF has improved to 45% by echocardiogram in 2022.  She also has abnormal ABI with moderate decrease in ABI bilaterally suggestive of PAD.    Patient did not want cardioversion or atrial fibrillation ablation evaluation.  She is also very averse to starting any medications. This is a 43-month office visit. Denies any chest pain, dyspnea, continues to exercise regularly. Tolerating anticoagulation without bleeding diathesis.   Discussed the use of AI scribe software for clinical note transcription with the patient, who gave verbal consent to proceed.  History of Present Illness Rita Wright, a patient with a history of non-ischemic cardiomyopathy, chronic heart failure, permanent atrial fibrillation, and stage 3A chronic kidney disease, reports feeling sluggish today, which she attributes to allergies. She also mentions experiencing shortness of breath, a long-standing issue that she believes may be related to her allergies. The patient's heart function has been stable at around 45% since 2019, and she is currently on metoprolol  tartrate for heart failure. She has been reluctant to add more medications to her regimen, despite having high cholesterol. The patient quit smoking in 1998 and maintains a heart-healthy diet. She walks her dogs around the house daily but does not engage in other forms of exercise. The patient also reports having panic attacks, which have led her to  stop driving.   Labs   Lab Results  Component Value Date   CHOL 247 (H) 12/22/2022   HDL 95 12/22/2022   LDLCALC 137 (H) 12/22/2022   TRIG 87 12/22/2022   CHOLHDL 2.6 12/22/2022   Lab Results  Component Value Date   NA 140 12/22/2022   K 4.4 12/22/2022   CO2 24 12/22/2022   GLUCOSE 92 12/22/2022   BUN 47 (H) 12/22/2022   CREATININE 0.98 12/22/2022   CALCIUM  9.8 12/22/2022   EGFR 61 12/22/2022   GFRNONAA 42 (L) 07/29/2022      Latest Ref Rng & Units 12/22/2022    9:00 AM 07/29/2022   12:00 PM 01/12/2022   11:28 AM  BMP  Glucose 70 - 99 mg/dL 92  93  92   BUN 8 - 27 mg/dL 47  46  42   Creatinine 0.57 - 1.00 mg/dL 9.01  8.65  8.86   BUN/Creat Ratio 12 - 28 48     Sodium 134 - 144 mmol/L 140  138  138   Potassium 3.5 - 5.2 mmol/L 4.4  4.9  4.5   Chloride 96 - 106 mmol/L 103  103  104   CO2 20 - 29 mmol/L 24  31  30    Calcium  8.7 - 10.3 mg/dL 9.8  9.8  89.9       Latest Ref Rng & Units 01/28/2023    8:48 AM 12/22/2022    9:00 AM 07/29/2022   12:00 PM  CBC  WBC 4.0 - 10.5 K/uL 5.3  4.9  5.7   Hemoglobin 12.0 - 15.0 g/dL 84.3  84.3  83.6   Hematocrit 36.0 - 46.0 % 48.3  48.4  47.9   Platelets 150 - 400 K/uL 149  143  156    Lab Results  Component Value Date   HGBA1C 5.8 (H) 12/22/2022    Lab Results  Component Value Date   TSH 4.970 (H) 12/22/2022    External Labs:  Care Everywhere labs 01/28/2023:  Hb 15.6/HCT 48.3, platelets 149, normal indicis.  TSH mildly elevated at 4.97, free T4 normal at 1.26.  Serum glucose 92 mg, BUN 47, creatinine 0.98, EGFR 61 mL, potassium 4.4, AST normal at 34, ALT minimally elevated at 43, bilirubin normal at 0.6.  Review of Systems  Cardiovascular:  Positive for dyspnea on exertion (chronic and stable). Negative for chest pain and leg swelling.   Physical Exam:   VS:  LMP 02/23/1992    Wt Readings from Last 3 Encounters:  01/28/23 147 lb 1.6 oz (66.7 kg)  12/22/22 145 lb 9.6 oz (66 kg)  07/29/22 141 lb 14.4 oz (64.4 kg)     Physical Exam Neck:     Vascular: No carotid bruit or JVD.  Cardiovascular:     Rate and Rhythm: Normal rate. Rhythm irregular.     Pulses:          Dorsalis pedis pulses are 1+ on the right side and 0 on the left side.       Posterior tibial pulses are 0 on the right side and 0 on the left side.     Heart sounds: No murmur heard. Pulmonary:     Effort: Pulmonary effort is normal.     Breath sounds: Normal breath sounds.  Abdominal:     General: Bowel sounds are normal.     Palpations: Abdomen is soft.  Musculoskeletal:     Right lower leg: No edema.     Left lower leg: No edema.  Skin:    Capillary Refill: Capillary refill takes less than 2 seconds.    Studies Reviewed: .    Echocardiogram 05/11/2022: Mildly depressed LV systolic function with visual EF 40-45%. Left ventricle cavity is normal in size. Normal left ventricular wall thickness. Hypokinetic global wall motion. Indeterminate diastolic filling pattern. Calculated EF 41%. Left atrial cavity is severely dilated at 54.6 ml/m^2. Right atrial cavity is severely dilated. Right ventricle cavity is normal in size. Mildly reduced right ventricular function. Structurally normal trileaflet aortic valve.  Mild (Grade I) aortic regurgitation. Structurally normal mitral valve.  Moderate (Grade II) mitral regurgitation. Structurally normal tricuspid valve.  Moderate to severe tricuspid regurgitation. Mild pulmonary hypertension. RVSP measures 37 mmHg. IVC is dilated with respiratory variation. Compared to 03/20/2020, MR and TR have progressed and there is bi-atrial dilation.  Compared to 06/27/2019, EF is improved from 20 to 25%. EKG:         Medications and allergies    Allergies  Allergen Reactions   Valsartan  Cough   Boniva [Ibandronate]     Horrible joint pains   Covid-19 (Mrna) Vaccine     Multiple symptoms   Erythromycin     GI upset   Fosamax [Alendronate]     Arthralgias, myalgias    Prednisone Other (See  Comments)    Jittery, insomnia, intolerance   Penicillins Hives, Rash and Other (See Comments)    Felt like she was going to pass out.     Current Outpatient Medications:    cholecalciferol (VITAMIN D3) 25 MCG (1000 UNIT) tablet, Take 1 tablet (1,000 Units total) by mouth daily., Disp: 90 tablet, Rfl: 3   folic acid  (FOLVITE ) 1 MG  tablet, Take 1 tablet (1 mg total) by mouth daily., Disp: 90 tablet, Rfl: 0   levothyroxine  (SYNTHROID ) 112 MCG tablet, Take 1 tablet (112 mcg total) by mouth daily., Disp: 90 tablet, Rfl: 1   metoprolol  tartrate (LOPRESSOR ) 50 MG tablet, Take 1 tablet (50 mg total) by mouth 2 (two) times daily., Disp: 180 tablet, Rfl: 0   rivaroxaban  (XARELTO ) 20 MG TABS tablet, Take 1 tablet (20 mg total) by mouth daily with supper., Disp: 180 tablet, Rfl: 1   No orders of the defined types were placed in this encounter.    There are no discontinued medications.   ASSESSMENT AND PLAN: .      ICD-10-CM   1. Non-ischemic cardiomyopathy (HCC)  I42.8     2. Permanent atrial fibrillation (HCC)  I48.21     3. Stage 3a chronic kidney disease (HCC)  N18.31      Assessment & Plan Chronic heart failure with reduced ejection fraction   Chronic heart failure with an ejection fraction of approximately 45% has been stable since 2019, with no ischemic etiology as confirmed by a stress test in 2019. She exhibits no significant symptoms of heart failure exacerbation and is managed with metoprolol  tartrate. Despite hyperlipidemia, she is reluctant to add medications. An echocardiogram is scheduled to monitor EF. If EF decreases, consider adjusting medical therapy to prevent progression to advanced heart failure.  Patient is hesitant to starting any medications.  I had a lengthy discussion regarding this previously as well.  Permanent atrial fibrillation   Permanent atrial fibrillation is managed with rivaroxaban  20 mg daily for stroke prevention, considering her risk factors such as age,  female sex, and heart failure with reduced EF. The dosage is appropriate for her renal function, creatinine clearance calculated is 53-54. Continue rivaroxaban  20 mg in the evening.  Stage 3A chronic kidney disease   Stage 3A chronic kidney disease is indicated by a creatinine level of 0.98 mg/dL and an eGFR of 61 fO/fpw/8.26f, with creatinine clearance at approximately 53-54 mL/min, showing mild reduction. Medication dosing remains appropriate. Continue current management and monitoring of kidney function.  Hyperlipidemia   Hyperlipidemia is present, but she has declined cholesterol-lowering medication.  Panic attacks   Panic attacks occur in situations involving heights and driving. She has stopped driving and is concerned about visiting the clinic if it relocates to a higher floor.  If her LVEF remains stable, as her cardiac issues are stable, I will see her back on a as needed basis.  Signed,  Gordy Bergamo, MD, [facility] 06/14/2023, 9:58 PM [facility] [address] [place], [place] [address] Phone: [phone]. Fax:  [phone]  "

## 2023-06-15 ENCOUNTER — Ambulatory Visit: Payer: Medicare Other | Attending: Cardiology | Admitting: Cardiology

## 2023-06-15 ENCOUNTER — Encounter: Payer: Self-pay | Admitting: Cardiology

## 2023-06-15 VITALS — BP 110/72 | HR 89 | Resp 16 | Ht 67.0 in | Wt 148.0 lb

## 2023-06-15 DIAGNOSIS — N1831 Chronic kidney disease, stage 3a: Secondary | ICD-10-CM | POA: Insufficient documentation

## 2023-06-15 DIAGNOSIS — I4821 Permanent atrial fibrillation: Secondary | ICD-10-CM | POA: Insufficient documentation

## 2023-06-15 DIAGNOSIS — I428 Other cardiomyopathies: Secondary | ICD-10-CM | POA: Diagnosis not present

## 2023-06-15 DIAGNOSIS — I5022 Chronic systolic (congestive) heart failure: Secondary | ICD-10-CM | POA: Insufficient documentation

## 2023-06-15 NOTE — Patient Instructions (Signed)
 Medication Instructions:   Your physician recommends that you continue on your current medications as directed. Please refer to the Current Medication list given to you today.  *If you need a refill on your cardiac medications before your next appointment, please call your pharmacy*  Lab Work: none If you have labs (blood work) drawn today and your tests are completely normal, you will receive your results only by: MyChart Message (if you have MyChart) OR A paper copy in the mail If you have any lab test that is abnormal or we need to change your treatment, we will call you to review the results.  Testing/Procedures: Your physician has requested that you have an echocardiogram. Echocardiography is a painless test that uses sound waves to create images of your heart. It provides your doctor with information about the size and shape of your heart and how well your heart's chambers and valves are working. This procedure takes approximately one hour. There are no restrictions for this procedure. Please do NOT wear cologne, perfume, aftershave, or lotions (deodorant is allowed). Please arrive 15 minutes prior to your appointment time.  Please note: We ask at that you not bring children with you during ultrasound (echo/ vascular) testing. Due to room size and safety concerns, children are not allowed in the ultrasound rooms during exams. Our front office staff cannot provide observation of children in our lobby area while testing is being conducted. An adult accompanying a patient to their appointment will only be allowed in the ultrasound room at the discretion of the ultrasound technician under special circumstances. We apologize for any inconvenience. Scheduled for April 24  Follow-Up: At [facility], you and your health needs are our priority.  As part of our continuing mission to provide you with exceptional heart care, our providers are all part of one team.  This team includes your  primary Cardiologist (physician) and Advanced Practice Providers or APPs (Physician Assistants and Nurse Practitioners) who all work together to provide you with the care you need, when you need it.  Your next appointment:   As needed  Provider:   Knox Perl, MD     We recommend signing up for the patient portal called "MyChart".  Sign up information is provided on this After Visit Summary.  MyChart is used to connect with patients for Virtual Visits (Telemedicine).  Patients are able to view lab/test results, encounter notes, upcoming appointments, etc.  Non-urgent messages can be sent to your provider as well.   To learn more about what you can do with MyChart, go to [email or web address].   Other Instructions        1st Floor: - Lobby - Registration  - Pharmacy  - Lab - Cafe  2nd Floor: - PV Lab - Diagnostic Testing (echo, CT, nuclear med)  3rd Floor: - Vacant  4th Floor: - TCTS (cardiothoracic surgery) - AFib Clinic - Structural Heart Clinic - Vascular Surgery  - Vascular Ultrasound  5th Floor: - HeartCare Cardiology (general and EP) - Clinical Pharmacy for coumadin, hypertension, lipid, weight-loss medications, and med management appointments    Valet parking services will be available as well.

## 2023-06-16 ENCOUNTER — Ambulatory Visit (HOSPITAL_COMMUNITY): Attending: Cardiovascular Disease

## 2023-06-16 DIAGNOSIS — I428 Other cardiomyopathies: Secondary | ICD-10-CM | POA: Diagnosis not present

## 2023-06-16 DIAGNOSIS — I5022 Chronic systolic (congestive) heart failure: Secondary | ICD-10-CM

## 2023-06-16 DIAGNOSIS — I4821 Permanent atrial fibrillation: Secondary | ICD-10-CM

## 2023-06-16 LAB — ECHOCARDIOGRAM COMPLETE
Area-P 1/2: 4.08 cm2
P 1/2 time: 446 ms
S' Lateral: 4.1 cm

## 2023-06-17 ENCOUNTER — Encounter: Payer: Self-pay | Admitting: Cardiology

## 2023-06-17 NOTE — Progress Notes (Signed)
 Message sent to the patient regarding coming back to the office for reevaluation of her cardiac function and also consideration for further evaluation and management and treatment decision making.  Patient has severe anxiety going to higher level floors.  Will follow-up and close the gap.

## 2023-08-02 ENCOUNTER — Ambulatory Visit: Payer: Medicare Other | Admitting: Hematology and Oncology

## 2023-08-09 ENCOUNTER — Telehealth: Payer: Self-pay

## 2023-08-09 NOTE — Telephone Encounter (Signed)
 Left message to confirm appt for 6/19

## 2023-08-11 ENCOUNTER — Inpatient Hospital Stay

## 2023-08-11 ENCOUNTER — Inpatient Hospital Stay: Attending: Hematology and Oncology | Admitting: Hematology and Oncology

## 2023-08-11 VITALS — BP 110/72 | HR 94 | Temp 97.9°F | Resp 17 | Ht 67.0 in | Wt 147.0 lb

## 2023-08-11 DIAGNOSIS — B001 Herpesviral vesicular dermatitis: Secondary | ICD-10-CM | POA: Diagnosis not present

## 2023-08-11 DIAGNOSIS — Z87891 Personal history of nicotine dependence: Secondary | ICD-10-CM | POA: Diagnosis not present

## 2023-08-11 DIAGNOSIS — D751 Secondary polycythemia: Secondary | ICD-10-CM | POA: Diagnosis not present

## 2023-08-11 LAB — CBC WITH DIFFERENTIAL/PLATELET
Abs Immature Granulocytes: 0.01 10*3/uL (ref 0.00–0.07)
Basophils Absolute: 0 10*3/uL (ref 0.0–0.1)
Basophils Relative: 1 %
Eosinophils Absolute: 0.2 10*3/uL (ref 0.0–0.5)
Eosinophils Relative: 3 %
HCT: 47.2 % — ABNORMAL HIGH (ref 36.0–46.0)
Hemoglobin: 15.8 g/dL — ABNORMAL HIGH (ref 12.0–15.0)
Immature Granulocytes: 0 %
Lymphocytes Relative: 17 %
Lymphs Abs: 1 10*3/uL (ref 0.7–4.0)
MCH: 31 pg (ref 26.0–34.0)
MCHC: 33.5 g/dL (ref 30.0–36.0)
MCV: 92.7 fL (ref 80.0–100.0)
Monocytes Absolute: 0.4 10*3/uL (ref 0.1–1.0)
Monocytes Relative: 6 %
Neutro Abs: 4.4 10*3/uL (ref 1.7–7.7)
Neutrophils Relative %: 73 %
Platelets: 129 10*3/uL — ABNORMAL LOW (ref 150–400)
RBC: 5.09 MIL/uL (ref 3.87–5.11)
RDW: 12.8 % (ref 11.5–15.5)
WBC: 5.9 10*3/uL (ref 4.0–10.5)
nRBC: 0 % (ref 0.0–0.2)

## 2023-08-11 LAB — FOLATE: Folate: >40 ng/mL (ref >5.9)

## 2023-08-11 NOTE — Progress Notes (Signed)
 [facility] CONSULT NOTE  Patient Care Team: Tysinger, Christiane Cowing, PA-C as PCP - General (Family Medicine) Knox Perl, MD as PCP - Cardiology (Cardiology) Baldo Bonds, MD as Consulting Physician (Gastroenterology) Seward Dao Alleen Arbour, MD as Consulting Physician (Ophthalmology) Knox Perl, MD as Consulting Physician (Cardiology) Sean Czar, MD as Consulting Physician (Allergy and Immunology)  CHIEF COMPLAINTS/PURPOSE OF CONSULTATION:  Polycythemia  ASSESSMENT & PLAN:   This is a very pleasant 73 year old female patient with past medical history significant for atrial fibrillation, cardiomyopathy referred to hematology for polycythemia, 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  Assessment and Plan Assessment & Plan Polycythemia Stable polycythemia with hemoglobin levels not requiring phlebotomy. Discussed potential causes like sleep apnea contributing to erythropoiesis. - Order CBC to assess hemoglobin levels. - Evaluate for sleep apnea if symptoms suggestive. - Follow up in 6 months unless intervention is needed based on lab results.  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 Discussed viral latency and triggers. - Consider antiviral medication such as Valacyclovir or Aciclovir for treatment of cold sores.   HISTORY OF PRESENTING ILLNESS:  Rita Wright 73 y.o. female is here because of polycythemia.  This is a very pleasant 73 year old female patient with past medical history significant for A-fib, dilated cardiomyopathy followed by cardiology who was referred for evaluation of polycythemia.   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   Discussed the use of AI scribe software for clinical note transcription with the patient, who gave verbal consent to proceed.  History of Present Illness Rita Wright is a 73 year old female  with polycythemia who presents for follow-up of high blood counts.  She has polycythemia, characterized by elevated blood counts, which is regularly monitored through lab work. There have been no recent hospitalizations or consultations with other doctors since her last visit.  She experiences recurrent cold sores on her nose, which she attributes to stress. She inquired about these during the visit. She has not started any new medications but expressed concern about her long-term folic acid  prescription, which she has been taking for ten years. She was unaware that folic acid  is available over the counter and has been receiving it by prescription due to her husband's veteran status.  She experiences night sweats, which she attributes to menopause, describing them as 'nuclear meltdown' episodes. These occur intermittently, and she has not experienced significant weight loss. Her appetite remains stable. She has a history of atrial fibrillation and reports disrupted sleep due to her husband's medical issues, which include frequent nighttime awakenings. She does not feel refreshed upon waking and has not been diagnosed with sleep apnea, though her husband occasionally checks to ensure she is breathing at night.   MEDICAL HISTORY:  Past Medical History:  Diagnosis Date   Colon cancer screening 09/2014   Cologuard test negative    Depression    Dilated cardiomyopathy (HCC) 05/2014   Dr. Darien Eden   Echocardiogram abnormal 05/2014   mild dilation of Left Ventricle; Dr. Darien Eden   Hashimoto's thyroiditis    History of mammogram 10/10/2014   normal   Multinodular goiter    Noncompliance with diagnostic testing    due to severe anxiety   Osteoporosis    Panic disorder    severe, doesn't drive, doesn't use stairs or elevators, severely limited by her anxiety   Permanent atrial  fibrillation (HCC) 08/22/2017   CHA2DS2-VASCScore: Risk Score 3,  Yearly risk of stroke  3.2. Recommendation: Anticoagulation                 Renal insufficiency    Thyroid  nodule 10/22/2014   benign follicular nodule on biopsy    SURGICAL HISTORY: Past Surgical History:  Procedure Laterality Date   COLONOSCOPY  2014   cologuard 2016; refuses colonoscopy   TUBAL LIGATION  1986    SOCIAL HISTORY: Social History   Socioeconomic History   Marital status: Married    Spouse name: Not on file   Number of children: 2   Years of education: Not on file   Highest education level: Associate degree: occupational, Scientist, product/process development, or vocational program  Occupational History   Not on file  Tobacco Use   Smoking status: Former    Current packs/day: 0.00    Average packs/day: 1.5 packs/day for 35.0 years (52.5 ttl pk-yrs)    Types: Cigarettes    Start date: 02/22/1957    Quit date: 02/23/1992    Years since quitting: 31.4   Smokeless tobacco: Never  Vaping Use   Vaping status: Never Used  Substance and Sexual Activity   Alcohol use: No   Drug use: No   Sexual activity: Not Currently  Other Topics Concern   Not on file  Social History Narrative   Married.  Exercise with walking her dog, gardening.      Social Drivers of Corporate investment banker Strain: Low Risk  (12/21/2022)   Overall Financial Resource Strain (CARDIA)    Difficulty of Paying Living Expenses: Not very hard  Food Insecurity: No Food Insecurity (12/21/2022)   Hunger Vital Sign    Worried About Running Out of Food in the Last Year: Never true    Ran Out of Food in the Last Year: Never true  Transportation Needs: No Transportation Needs (12/21/2022)   PRAPARE - Administrator, Civil Service (Medical): No    Lack of Transportation (Non-Medical): No  Physical Activity: Inactive (12/21/2022)   Exercise Vital Sign    Days of Exercise per Week: 0 days    Minutes of Exercise per Session: 0 min  Stress: No Stress Concern Present (12/21/2022)   [employer] of Occupational Health - Occupational Stress Questionnaire    Feeling of Stress  : Only a little  Social Connections: Moderately Isolated (12/21/2022)   Social Connection and Isolation Panel    Frequency of Communication with Friends and Family: More than three times a week    Frequency of Social Gatherings with Friends and Family: Twice a week    Attends Religious Services: Never    Database administrator or Organizations: No    Attends Banker Meetings: Never    Marital Status: Married  Catering manager Violence: Not At Risk (12/14/2022)   Humiliation, Afraid, Rape, and Kick questionnaire    Fear of Current or Ex-Partner: No    Emotionally Abused: No    Physically Abused: No    Sexually Abused: No    FAMILY HISTORY: Family History  Problem Relation Age of Onset   Hypertension Mother    Parkinson's disease Father    Heart failure Sister    Hypertension Brother    Cerebral aneurysm Sister    Throat cancer Brother     ALLERGIES:  is allergic to valsartan , boniva [ibandronate], covid-19 (mrna) vaccine, erythromycin, fosamax [alendronate], prednisone, and penicillins.  MEDICATIONS:  Current Outpatient Medications  Medication Sig Dispense Refill   cholecalciferol (VITAMIN D3) 25 MCG (1000 UNIT) tablet Take 1 tablet (1,000 Units total) by mouth daily. 90 tablet 3   folic acid  (FOLVITE ) 1 MG tablet Take 1 tablet (1 mg total) by mouth daily. 90 tablet 0   levothyroxine  (SYNTHROID ) 112 MCG tablet Take 1 tablet (112 mcg total) by mouth daily. 90 tablet 1   metoprolol  tartrate (LOPRESSOR ) 50 MG tablet Take 1 tablet (50 mg total) by mouth 2 (two) times daily. 180 tablet 0   rivaroxaban  (XARELTO ) 20 MG TABS tablet Take 1 tablet (20 mg total) by mouth daily with supper. 180 tablet 1   No current facility-administered medications for this visit.     PHYSICAL EXAMINATION: ECOG PERFORMANCE STATUS: 0 - Asymptomatic  There were no vitals filed for this visit.   There were no vitals filed for this visit.  Physical Exam Constitutional:       Appearance: Normal appearance.   Cardiovascular:     Pulses: Normal pulses.     Heart sounds: Normal heart sounds.  Pulmonary:     Effort: Pulmonary effort is normal.     Breath sounds: Normal breath sounds.   Musculoskeletal:        General: Normal range of motion.     Cervical back: Normal range of motion and neck supple. No rigidity.  Lymphadenopathy:     Cervical: No cervical adenopathy.   Skin:    General: Skin is warm and dry.   Neurological:     General: No focal deficit present.     Mental Status: She is alert.   Psychiatric:        Mood and Affect: Mood normal.   LABORATORY DATA:  I have reviewed the data as listed Lab Results  Component Value Date   WBC 5.3 01/28/2023   HGB 15.6 (H) 01/28/2023   HCT 48.3 (H) 01/28/2023   MCV 96.0 01/28/2023   PLT 149 (L) 01/28/2023     Chemistry      Component Value Date/Time   NA 140 12/22/2022 0900   K 4.4 12/22/2022 0900   CL 103 12/22/2022 0900   CO2 24 12/22/2022 0900   BUN 47 (H) 12/22/2022 0900   CREATININE 0.98 12/22/2022 0900   CREATININE 0.87 09/01/2016 0818      Component Value Date/Time   CALCIUM  9.8 12/22/2022 0900   ALKPHOS 85 12/22/2022 0900   AST 34 12/22/2022 0900   ALT 43 (H) 12/22/2022 0900   BILITOT 0.6 12/22/2022 0900       RADIOGRAPHIC STUDIES: I have personally reviewed the radiological images as listed and agreed with the findings in the report. No results found.  I discussed the limitations of evaluation and management by telemedicine. The patient expressed understanding and agreed to proceed.     Murleen Arms, MD 08/11/2023 8:35 AM

## 2023-08-12 ENCOUNTER — Ambulatory Visit: Payer: Self-pay | Admitting: Hematology and Oncology

## 2023-08-12 NOTE — Telephone Encounter (Signed)
 Attempted to call pt to give results per MD. LVM for call back

## 2023-08-12 NOTE — Telephone Encounter (Signed)
-----   Message from Epps Iruku sent at 08/12/2023  8:37 AM EDT ----- Polycythemia is stable, no need to phlebotomize. ----- Message ----- From: Dannis Dy, Lab [facility] [place] Sent: 08/11/2023  10:01 AM EDT To: Murleen Arms, MD

## 2023-08-15 ENCOUNTER — Ambulatory Visit: Payer: Self-pay | Admitting: *Deleted

## 2023-08-15 NOTE — Telephone Encounter (Signed)
                      FYI Only or Action Required?: Action required by provider: request for appointment.  Patient was last seen in primary care on 12/22/2022 by Bulah Alm RAMAN, PA-C. Called Nurse Triage reporting Fever. Symptoms began today. Interventions attempted: OTC medications: tylenol. Symptoms are: gradually worsening.  Triage Disposition: See HCP Within 4 Hours (Or PCP Triage)  Patient/caregiver understands and will follow disposition?: Unsure                Copied from CRM [id number]. Topic: Clinical - Red Word Triage >> Aug 15, 2023  1:33 PM Donna BRAVO wrote: Red Word that prompted transfer to Nurse Triage: patient coughed this morning and vomited bile several times, fever 101.2 with tylenol, body aches, glands around neck hurt, sometimes having problems breathing, patient has Afib, heart was fluttering this morning Reason for Disposition  [1] Fever > 101 F (38.3 C) AND [2] age > 60 years  Answer Assessment - Initial Assessment Questions 1. TEMPERATURE: What is the most recent temperature?  How was it measured?      101.2 2. ONSET: When did the fever start?      Yesterday  3. CHILLS: Do you have chills? If yes: How bad are they?  (e.g., none, mild, moderate, severe)   - NONE: no chills   - MILD: feeling cold   - MODERATE: feeling very cold, some shivering (feels better under a thick blanket)   - SEVERE: feeling extremely cold with shaking chills (general body shaking, rigors; even under a thick blanket)      Moderate now  4. OTHER SYMPTOMS: Do you have any other symptoms besides the fever?  (e.g., abdomen pain, cough, diarrhea, earache, headache, sore throat, urination pain)     Cough some production , glands at ears sore to touch, fever 101.2. heart fluttering  this am 0730. 5. CAUSE: If there are no symptoms, ask: What do you think is causing the fever?      Sx noted and reports grandchildren sick and patient has had cold sx 4-5  days now 6. CONTACTS: Does anyone else in the family have an infection?     Yes  7. TREATMENT: What have you done so far to treat this fever? (e.g., medications)     Tylenol not effective  8. IMMUNOCOMPROMISE: Do you have of the following: diabetes, HIV positive, splenectomy, cancer chemotherapy, chronic steroid treatment, transplant patient, etc.     See hx does not tolerate antibiotics well 9. PREGNANCY: Is there any chance you are pregnant? When was your last menstrual period?     na 10. TRAVEL: Have you traveled out of the country in the last month? (e.g., travel history, exposures)       na  Protocols used: [facility]

## 2023-08-16 ENCOUNTER — Ambulatory Visit
Admission: RE | Admit: 2023-08-16 | Discharge: 2023-08-16 | Disposition: A | Discharge status: Home / Self Care | Source: Ambulatory Visit | Attending: Family Medicine | Admitting: Family Medicine

## 2023-08-16 ENCOUNTER — Ambulatory Visit (INDEPENDENT_AMBULATORY_CARE_PROVIDER_SITE_OTHER): Admitting: Family Medicine

## 2023-08-16 ENCOUNTER — Encounter: Payer: Self-pay | Admitting: Family Medicine

## 2023-08-16 ENCOUNTER — Ambulatory Visit: Payer: Self-pay | Admitting: Family Medicine

## 2023-08-16 VITALS — BP 112/70 | HR 102 | Temp 98.6°F | Wt 147.0 lb

## 2023-08-16 DIAGNOSIS — I4821 Permanent atrial fibrillation: Secondary | ICD-10-CM

## 2023-08-16 DIAGNOSIS — R0602 Shortness of breath: Secondary | ICD-10-CM | POA: Diagnosis not present

## 2023-08-16 DIAGNOSIS — R9389 Abnormal findings on diagnostic imaging of other specified body structures: Secondary | ICD-10-CM

## 2023-08-16 DIAGNOSIS — J40 Bronchitis, not specified as acute or chronic: Secondary | ICD-10-CM

## 2023-08-16 DIAGNOSIS — R059 Cough, unspecified: Secondary | ICD-10-CM | POA: Diagnosis not present

## 2023-08-16 DIAGNOSIS — R509 Fever, unspecified: Secondary | ICD-10-CM

## 2023-08-16 LAB — CBC WITH DIFFERENTIAL/PLATELET
Basophils Absolute: 0 10*3/uL (ref 0.0–0.2)
Basos: 0 %
EOS (ABSOLUTE): 0 10*3/uL (ref 0.0–0.4)
Eos: 0 %
Hematocrit: 49.2 % — ABNORMAL HIGH (ref 34.0–46.6)
Hemoglobin: 15.8 g/dL (ref 11.1–15.9)
Immature Grans (Abs): 0 10*3/uL (ref 0.0–0.1)
Immature Granulocytes: 0 %
Lymphocytes Absolute: 0.5 10*3/uL — ABNORMAL LOW (ref 0.7–3.1)
Lymphs: 6 %
MCH: 30.9 pg (ref 26.6–33.0)
MCHC: 32.1 g/dL (ref 31.5–35.7)
MCV: 96 fL (ref 79–97)
Monocytes Absolute: 0.4 10*3/uL (ref 0.1–0.9)
Monocytes: 5 %
Neutrophils Absolute: 7.6 10*3/uL — ABNORMAL HIGH (ref 1.4–7.0)
Neutrophils: 88 %
Platelets: 132 10*3/uL — ABNORMAL LOW (ref 150–450)
RBC: 5.12 x10E6/uL (ref 3.77–5.28)
RDW: 12 % (ref 11.7–15.4)
WBC: 8.6 10*3/uL (ref 3.4–10.8)

## 2023-08-16 MED ORDER — DOXYCYCLINE HYCLATE 100 MG PO TABS: 100.0000 mg | ORAL_TABLET | Freq: Two times a day (BID) | ORAL | 0 refills | Status: DC

## 2023-08-16 NOTE — Progress Notes (Signed)
   Subjective:    Patient ID: Rita Wright, female    DOB: [birth year], 73 y.o.   MRN: [id number]  HPI She complains of a 1 week history of difficulty with malaise, rhinorrhea, nasal congestion as well as sinus congestion with a dry cough that has now become productive.  She also notes fever and chills and some shortness of breath.  She does have a history of  A-fib.   Review of Systems     Objective:    Physical Exam Alert and in no distress. Tympanic membranes and canals are normal. Pharyngeal area is normal. Neck is supple without adenopathy or thyromegaly. Cardiac exam shows a regular sinus rhythm without murmurs or gallops. Lungs show harsh left-sided rhonchi.  She is not tachypneic.        Assessment & Plan:  Permanent atrial fibrillation (HCC)  Bronchitis - Plan: DG Chest 2 View, CBC with Differential/Platelet, doxycycline  (VIBRA -TABS) 100 MG tablet  Fever and chills - Plan: DG Chest 2 View, CBC with Differential/Platelet, doxycycline  (VIBRA -TABS) 100 MG tablet She does have allergies to penicillin as well as erythromycin so I might possibly change her medication based on the x-ray report and CBC.

## 2023-08-16 NOTE — Addendum Note (Signed)
 Addended by: JOYCE NORLEEN BROCKS on: 08/16/2023 01:12 PM   Modules accepted: Orders

## 2023-08-23 ENCOUNTER — Ambulatory Visit
Admission: RE | Admit: 2023-08-23 | Discharge: 2023-08-23 | Disposition: A | Discharge status: Home / Self Care | Source: Ambulatory Visit | Attending: Family Medicine | Admitting: Family Medicine

## 2023-08-23 DIAGNOSIS — R9389 Abnormal findings on diagnostic imaging of other specified body structures: Secondary | ICD-10-CM

## 2023-08-29 ENCOUNTER — Other Ambulatory Visit: Payer: Self-pay | Admitting: Medical

## 2023-08-29 NOTE — Telephone Encounter (Unsigned)
 Copied from CRM [id number]. Topic: Clinical - Medication Refill >> Aug 29, 2023  2:42 PM Marissa P wrote: Medication: levothyroxine  (SYNTHROID ) 112 MCG tablet  Has the patient contacted their pharmacy? No (Agent: If no, request that the patient contact the pharmacy for the refill. If patient does not wish to contact the pharmacy document the reason why and proceed with request.) (Agent: If yes, when and what did the pharmacy advise?)  This is the patient's preferred pharmacy:   [facility] - [place], [place] - [address] [place] [address] Phone: [phone] Fax: [phone] Hours: Not open 24 hours     Is this the correct pharmacy for this prescription? Yes If no, delete pharmacy and type the correct one.   Has the prescription been filled recently? Yes  Is the patient out of the medication? No  Has the patient been seen for an appointment in the last year OR does the patient have an upcoming appointment? Yes  Can we respond through MyChart? Yes  Agent: Please be advised that Rx refills may take up to 3 business days. We ask that you follow-up with your pharmacy.

## 2023-08-30 MED ORDER — LEVOTHYROXINE SODIUM 112 MCG PO TABS: 112.0000 ug | ORAL_TABLET | Freq: Every day | ORAL | 0 refills | Status: DC

## 2023-09-02 ENCOUNTER — Ambulatory Visit (INDEPENDENT_AMBULATORY_CARE_PROVIDER_SITE_OTHER): Admitting: Medical

## 2023-09-02 ENCOUNTER — Encounter: Payer: Self-pay | Admitting: Medical

## 2023-09-02 VITALS — BP 128/80 | HR 64 | Wt 144.0 lb

## 2023-09-02 DIAGNOSIS — I4821 Permanent atrial fibrillation: Secondary | ICD-10-CM | POA: Diagnosis not present

## 2023-09-02 DIAGNOSIS — F411 Generalized anxiety disorder: Secondary | ICD-10-CM

## 2023-09-02 DIAGNOSIS — N1831 Chronic kidney disease, stage 3a: Secondary | ICD-10-CM

## 2023-09-02 DIAGNOSIS — F40241 Acrophobia: Secondary | ICD-10-CM | POA: Insufficient documentation

## 2023-09-02 DIAGNOSIS — R7301 Impaired fasting glucose: Secondary | ICD-10-CM | POA: Diagnosis not present

## 2023-09-02 DIAGNOSIS — E7800 Pure hypercholesterolemia, unspecified: Secondary | ICD-10-CM | POA: Diagnosis not present

## 2023-09-02 DIAGNOSIS — Z91148 Patient's other noncompliance with medication regimen for other reason: Secondary | ICD-10-CM

## 2023-09-02 DIAGNOSIS — E039 Hypothyroidism, unspecified: Secondary | ICD-10-CM | POA: Diagnosis not present

## 2023-09-02 MED ORDER — FOLIC ACID 1 MG PO TABS: 1.0000 mg | ORAL_TABLET | Freq: Every day | ORAL | 3 refills | Status: AC

## 2023-09-02 NOTE — Progress Notes (Signed)
 Subjective:  Rita Wright is a 73 y.o. female who presents for Chief Complaint  Patient presents with   Medical Management of Chronic Issues    Med check f/u on thyroid  and folic acid , saw Dr. Joyce a few weeks ago getting better but still has some congestion, pt. Needs folic acid  refilled by you has bottle with your name on it with her.      Here for med check.  Hypothyroidism-compliant with medication, here for labs  She needs refills on folic acid .  She is taking this long-term.  She is compliant with Xarelto  and metoprolol   Her husband is still sick from where I did a virtual visit with him last week.  She has been stressed about this.  She is having to change cardiologist since the: Cardiology group just moved the fifth floor setting.  She has strong phobia of heights and elevators and always requires her visits to be on the first floor only.  No other aggravating or relieving factors.    No other c/o.  Past Medical History:  Diagnosis Date   Colon cancer screening 09/2014   Cologuard test negative    Depression    Dilated cardiomyopathy (HCC) 05/2014   Dr. Rosamond   Echocardiogram abnormal 05/2014   mild dilation of Left Ventricle; Dr. Rosamond   Hashimoto's thyroiditis    History of mammogram 10/10/2014   normal   Multinodular goiter    Noncompliance with diagnostic testing    due to severe anxiety   Osteoporosis    Panic disorder    severe, doesn't drive, doesn't use stairs or elevators, severely limited by her anxiety   Permanent atrial fibrillation (HCC) 08/22/2017   CHA2DS2-VASCScore: Risk Score 3,  Yearly risk of stroke  3.2. Recommendation: Anticoagulation                Renal insufficiency    Thyroid  nodule 10/22/2014   benign follicular nodule on biopsy   Current Outpatient Medications on File Prior to Visit  Medication Sig Dispense Refill   cholecalciferol (VITAMIN D3) 25 MCG (1000 UNIT) tablet Take 1 tablet (1,000 Units total) by mouth daily. 90 tablet 3    levothyroxine  (SYNTHROID ) 112 MCG tablet Take 1 tablet (112 mcg total) by mouth daily. 30 tablet 0   metoprolol  tartrate (LOPRESSOR ) 50 MG tablet Take 1 tablet (50 mg total) by mouth 2 (two) times daily. 180 tablet 0   rivaroxaban  (XARELTO ) 20 MG TABS tablet Take 1 tablet (20 mg total) by mouth daily with supper. 180 tablet 1   No current facility-administered medications on file prior to visit.     The following portions of the patient's history were reviewed and updated as appropriate: allergies, current medications, past family history, past medical history, past social history, past surgical history and problem list.  ROS Otherwise as in subjective above   Objective: BP 128/80   Pulse 64   Wt 144 lb (65.3 kg)   LMP 02/23/1992   BMI 22.55 kg/m   General appearance: alert, no distress, well developed, well nourished Neck: supple, no lymphadenopathy, no thyromegaly, no masses Heart: Irregularly irregular RR, normal S1, S2, no murmurs Lungs: CTA bilaterally, no wheezes, rhonchi, or rales Pulses: 2+ radial pulses, 2+ pedal pulses, normal cap refill Ext: no edema    Assessment: Encounter Diagnoses  Name Primary?   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  Compliant with thyroid  medication  History of high cholesterol but she declines medication  CKD 3-updated labs today for electrolytes and kidney  History of permanent A-fib-on Xarelto  and metoprolol   Long-term use of folic acid  supplementation.  Refilled medication today  Impaired glucose-updated labs today  She is due for COVID and shingles vaccine.  She declines vaccines, mammogram   Athea was seen today for medical management of chronic  issues.  Diagnoses and all orders for this visit:  Hypercholesteremia  Hypothyroidism, unspecified type -     TSH + free T4 -     folic acid  (FOLVITE ) 1 MG tablet; Take 1 tablet (1 mg total) by mouth daily.  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

## 2023-09-03 LAB — BASIC METABOLIC PANEL WITH GFR
BUN/Creatinine Ratio: 28 (ref 12–28)
BUN: 35 mg/dL — AB (ref 8–27)
CO2: 21 mmol/L (ref 20–29)
Calcium: 9.5 mg/dL (ref 8.7–10.3)
Chloride: 102 mmol/L (ref 96–106)
Creatinine, Ser: 1.26 mg/dL — AB (ref 0.57–1.00)
Glucose: 92 mg/dL (ref 70–99)
Potassium: 4.8 mmol/L (ref 3.5–5.2)
Sodium: 137 mmol/L (ref 134–144)
eGFR: 45 mL/min/1.73 — AB (ref >59)

## 2023-09-03 LAB — HEMOGLOBIN A1C
Est. average glucose Bld gHb Est-mCnc: 114 mg/dL
Hgb A1c MFr Bld: 5.6 % (ref 4.8–5.6)

## 2023-09-03 LAB — TSH+FREE T4
Free T4: 1.59 ng/dL (ref 0.82–1.77)
TSH: 1.28 u[IU]/mL (ref 0.450–4.500)

## 2023-09-05 ENCOUNTER — Other Ambulatory Visit: Payer: Self-pay | Admitting: Medical

## 2023-09-05 ENCOUNTER — Ambulatory Visit: Payer: Self-pay | Admitting: Medical

## 2023-09-05 MED ORDER — LEVOTHYROXINE SODIUM 112 MCG PO TABS: 112.0000 ug | ORAL_TABLET | Freq: Every day | ORAL | 1 refills | Status: DC

## 2023-09-05 MED ORDER — VITAMIN D 25 MCG (1000 UNIT) PO TABS: 1000.0000 [IU] | ORAL_TABLET | Freq: Every day | ORAL | 3 refills | Status: AC

## 2023-09-05 NOTE — Progress Notes (Signed)
Results sent to MyChart

## 2023-09-16 ENCOUNTER — Other Ambulatory Visit: Payer: Self-pay | Admitting: Cardiology

## 2023-09-16 DIAGNOSIS — I4821 Permanent atrial fibrillation: Secondary | ICD-10-CM

## 2023-09-16 DIAGNOSIS — I428 Other cardiomyopathies: Secondary | ICD-10-CM

## 2023-11-04 DIAGNOSIS — E785 Hyperlipidemia, unspecified: Secondary | ICD-10-CM | POA: Diagnosis not present

## 2023-11-04 DIAGNOSIS — I420 Dilated cardiomyopathy: Secondary | ICD-10-CM | POA: Diagnosis not present

## 2023-11-04 DIAGNOSIS — I482 Chronic atrial fibrillation, unspecified: Secondary | ICD-10-CM | POA: Diagnosis not present

## 2023-11-04 DIAGNOSIS — N1831 Chronic kidney disease, stage 3a: Secondary | ICD-10-CM | POA: Diagnosis not present

## 2023-11-04 DIAGNOSIS — Z5320 Procedure and treatment not carried out because of patient's decision for unspecified reasons: Secondary | ICD-10-CM | POA: Diagnosis not present

## 2023-11-04 DIAGNOSIS — Z7901 Long term (current) use of anticoagulants: Secondary | ICD-10-CM | POA: Diagnosis not present

## 2023-12-20 ENCOUNTER — Ambulatory Visit: Payer: Medicare Other

## 2023-12-20 VITALS — BP 106/70 | HR 92 | Temp 97.7°F | Ht 67.0 in | Wt 142.2 lb

## 2023-12-20 DIAGNOSIS — Z0000 Encounter for general adult medical examination without abnormal findings: Secondary | ICD-10-CM

## 2023-12-20 NOTE — Progress Notes (Signed)
 Subjective:   Rita Wright is a 73 y.o. who presents for a Medicare Wellness preventive visit.  As a reminder, Annual Wellness Visits don't include a physical exam, and some assessments may be limited, especially if this visit is performed virtually. We may recommend an in-person follow-up visit with your provider if needed.  Visit Complete: In person    Persons Participating in Visit: Patient.  AWV Questionnaire: Yes: Patient Medicare AWV questionnaire was completed by the patient on 12/17/2023; I have confirmed that all information answered by patient is correct and no changes since this date.  Cardiac Risk Factors include: advanced age (>59men, >67 women)     Objective:    Today's Vitals   12/20/23 1455  BP: 106/70  Pulse: 92  Temp: 97.7 F (36.5 C)  TempSrc: Oral  SpO2: 96%  Weight: 142 lb 3.2 oz (64.5 kg)  Height: 5' 7 (1.702 m)   Body mass index is 22.27 kg/m.     12/20/2023    3:00 PM 12/14/2022    3:00 PM 12/11/2021   11:31 AM 12/10/2020    9:27 AM 12/07/2018    9:22 AM  Advanced Directives  Does Patient Have a Medical Advance Directive? No No No No No  Would patient like information on creating a medical advance directive? No - Patient declined   Yes (MAU/Ambulatory/Procedural Areas - Information given) No - Patient declined    Current Medications (verified) Outpatient Encounter Medications as of 12/20/2023  Medication Sig   cholecalciferol (VITAMIN D3) 25 MCG (1000 UNIT) tablet Take 1 tablet (1,000 Units total) by mouth daily.   folic acid  (FOLVITE ) 1 MG tablet Take 1 tablet (1 mg total) by mouth daily.   levothyroxine  (SYNTHROID ) 112 MCG tablet Take 1 tablet (112 mcg total) by mouth daily.   metoprolol  tartrate (LOPRESSOR ) 50 MG tablet TAKE ONE TABLET BY MOUTH TWICE A DAY   rivaroxaban  (XARELTO ) 20 MG TABS tablet Take 1 tablet (20 mg total) by mouth daily with supper.   No facility-administered encounter medications on file as of 12/20/2023.     Allergies (verified) Valsartan , Boniva [ibandronate], Covid-19 (mrna) vaccine, Erythromycin, Fosamax [alendronate], Prednisone, and Penicillins   History: Past Medical History:  Diagnosis Date   Colon cancer screening 09/2014   Cologuard test negative    Depression    Dilated cardiomyopathy (HCC) 05/2014   Dr. Rosamond   Echocardiogram abnormal 05/2014   mild dilation of Left Ventricle; Dr. Rosamond   Hashimoto's thyroiditis    History of mammogram 10/10/2014   normal   Multinodular goiter    Noncompliance with diagnostic testing    due to severe anxiety   Osteoporosis    Panic disorder    severe, doesn't drive, doesn't use stairs or elevators, severely limited by her anxiety   Permanent atrial fibrillation (HCC) 08/22/2017   CHA2DS2-VASCScore: Risk Score 3,  Yearly risk of stroke  3.2. Recommendation: Anticoagulation                Renal insufficiency    Thyroid  nodule 10/22/2014   benign follicular nodule on biopsy   Past Surgical History:  Procedure Laterality Date   COLONOSCOPY  2014   cologuard 2016; refuses colonoscopy   TUBAL LIGATION  1986   Family History  Problem Relation Age of Onset   Hypertension Mother    Parkinson's disease Father    Heart failure Sister    Hypertension Brother    Cerebral aneurysm Sister    Throat cancer Brother  Social History   Socioeconomic History   Marital status: Married    Spouse name: Not on file   Number of children: 2   Years of education: Not on file   Highest education level: Associate degree: occupational, scientist, product/process development, or vocational program  Occupational History   Not on file  Tobacco Use   Smoking status: Former    Current packs/day: 0.00    Average packs/day: 1.5 packs/day for 35.0 years (52.5 ttl pk-yrs)    Types: Cigarettes    Start date: 02/22/1957    Quit date: 02/23/1992    Years since quitting: 31.8   Smokeless tobacco: Never  Vaping Use   Vaping status: Never Used  Substance and Sexual Activity   Alcohol  use: No   Drug use: No   Sexual activity: Not Currently  Other Topics Concern   Not on file  Social History Narrative   Married.  Exercise with walking her dog, gardening.      Social Drivers of Corporate Investment Banker Strain: Low Risk  (12/17/2023)   Overall Financial Resource Strain (CARDIA)    Difficulty of Paying Living Expenses: Not hard at all  Food Insecurity: No Food Insecurity (12/17/2023)   Hunger Vital Sign    Worried About Running Out of Food in the Last Year: Never true    Ran Out of Food in the Last Year: Never true  Transportation Needs: No Transportation Needs (12/17/2023)   PRAPARE - Administrator, Civil Service (Medical): No    Lack of Transportation (Non-Medical): No  Physical Activity: Insufficiently Active (12/17/2023)   Exercise Vital Sign    Days of Exercise per Week: 6 days    Minutes of Exercise per Session: 10 min  Stress: Stress Concern Present (12/17/2023)   [employer] of Occupational Health - Occupational Stress Questionnaire    Feeling of Stress: To some extent  Social Connections: Moderately Isolated (12/17/2023)   Social Connection and Isolation Panel    Frequency of Communication with Friends and Family: More than three times a week    Frequency of Social Gatherings with Friends and Family: Twice a week    Attends Religious Services: Never    Database Administrator or Organizations: No    Attends Engineer, Structural: Not on file    Marital Status: Married    Tobacco Counseling Counseling given: Not Answered    Clinical Intake:  Pre-visit preparation completed: Yes  Pain : No/denies pain     Nutritional Status: BMI of 19-24  Normal Nutritional Risks: None Diabetes: No  Lab Results  Component Value Date   HGBA1C 5.6 09/02/2023   HGBA1C 5.8 (H) 12/22/2022   HGBA1C 5.7 (H) 12/16/2021     How often do you need to have someone help you when you read instructions, pamphlets, or other written  materials from your doctor or pharmacy?: 1 - Never  Interpreter Needed?: No  Information entered by :: NAllen LPN   Activities of Daily Living     12/17/2023    7:40 PM  In your present state of health, do you have any difficulty performing the following activities:  Hearing? 0  Vision? 0  Difficulty concentrating or making decisions? 0  Walking or climbing stairs? 0  Dressing or bathing? 0  Doing errands, shopping? 0  Preparing Food and eating ? N  Using the Toilet? N  In the past six months, have you accidently leaked urine? Y  Do you have problems with  loss of bowel control? N  Managing your Medications? N  Managing your Finances? N  Housekeeping or managing your Housekeeping? N    Patient Care Team: Tysinger, Alm RAMAN, PA-C as PCP - General (Family Medicine) Ladona Heinz, MD as PCP - Cardiology (Cardiology) Dianna Specking, MD as Consulting Physician (Gastroenterology) Elner Arley LABOR, MD as Consulting Physician (Ophthalmology) Ladona Heinz, MD as Consulting Physician (Cardiology) Marinda Rocky SAILOR, MD as Consulting Physician (Allergy and Immunology)  I have updated your Care Teams any recent Medical Services you may have received from other providers in the past year.     Assessment:   This is a routine wellness examination for Rita Wright.  Hearing/Vision screen Hearing Screening - Comments:: Denies hearing issues Vision Screening - Comments:: Regular eye exams, Dr. Elner   Goals Addressed             This Visit's Progress    Patient Stated       12/20/2023, stay alive       Depression Screen     12/20/2023    3:01 PM 09/02/2023   10:12 AM 12/14/2022    3:01 PM 12/11/2021   11:32 AM 12/10/2020    9:27 AM 12/10/2019    8:31 AM 12/07/2018    9:22 AM  PHQ 2/9 Scores  PHQ - 2 Score 1 1 0 0 0 1 1  PHQ- 9 Score 4  0 0       Fall Risk     12/17/2023    7:40 PM 09/02/2023   10:12 AM 12/13/2022    2:53 PM 12/11/2021   11:31 AM 12/08/2021    5:53 PM   Fall Risk   Falls in the past year? 0 0 0 0 0  Number falls in past yr: 0 0 0 0   Injury with Fall? 0 0 0 0   Risk for fall due to : Medication side effect No Fall Risks Medication side effect Medication side effect   Follow up Falls prevention discussed;Falls evaluation completed Falls evaluation completed Falls prevention discussed;Falls evaluation completed Falls prevention discussed;Education provided;Falls evaluation completed       Data saved with a previous flowsheet row definition    MEDICARE RISK AT HOME:  Medicare Risk at Home Any stairs in or around the home?: (Patient-Rptd) Yes If so, are there any without handrails?: (Patient-Rptd) Yes Home free of loose throw rugs in walkways, pet beds, electrical cords, etc?: (Patient-Rptd) Yes Adequate lighting in your home to reduce risk of falls?: (Patient-Rptd) Yes Life alert?: (Patient-Rptd) No Use of a cane, walker or w/c?: (Patient-Rptd) No Grab bars in the bathroom?: (Patient-Rptd) Yes Shower chair or bench in shower?: (Patient-Rptd) Yes Elevated toilet seat or a handicapped toilet?: (Patient-Rptd) Yes  TIMED UP AND GO:  Was the test performed?  Yes  Length of time to ambulate 10 feet: 5 sec Gait steady and fast without use of assistive device  Cognitive Function: 6CIT completed        12/20/2023    3:02 PM 12/14/2022    3:04 PM 12/11/2021   11:33 AM  6CIT Screen  What Year? 0 points 0 points 0 points  What month? 0 points 0 points 0 points  What time? 0 points 0 points 0 points  Count back from 20 0 points 0 points 0 points  Months in reverse 0 points 2 points 2 points  Repeat phrase 0 points 0 points 0 points  Total Score 0 points 2 points 2 points  Immunizations Immunization History  Administered Date(s) Administered   Fluad Quad(high Dose 65+) 12/10/2019   INFLUENZA, HIGH DOSE SEASONAL PF 12/05/2017   Influenza Split 12/28/2010   Influenza Whole 11/14/2009   Influenza, Seasonal, Injecte, Preservative  Fre 03/03/2012   Influenza,inj,Quad PF,6+ Mos 11/17/2012, 11/22/2013   Influenza-Unspecified 12/02/2015   PFIZER(Purple Top)SARS-COV-2 Vaccination 05/13/2019, 05/28/2019   Pneumococcal Conjugate-13 09/01/2016   Pneumococcal Polysaccharide-23 12/05/2017   Tdap 08/01/2008    Screening Tests Health Maintenance  Topic Date Due   Zoster Vaccines- Shingrix (1 of 2) Never done   COVID-19 Vaccine (3 - Pfizer risk series) 06/25/2019   Mammogram  01/02/2023   Influenza Vaccine  09/23/2023   DTaP/Tdap/Td (2 - Td or Tdap) 12/22/2023 (Originally 08/02/2018)   Fecal DNA (Cologuard)  03/06/2024   Medicare Annual Wellness (AWV)  12/19/2024   Pneumococcal Vaccine: 50+ Years  Completed   DEXA SCAN  Completed   Hepatitis C Screening  Completed   Meningococcal B Vaccine  Aged Out   Colonoscopy  Discontinued    Health Maintenance Items Addressed: Declines vaccines. Not interested in mammogram.  Additional Screening:  Vision Screening: Recommended annual ophthalmology exams for early detection of glaucoma and other disorders of the eye. Is the patient up to date with their annual eye exam?  Yes  Who is the provider or what is the name of the office in which the patient attends annual eye exams? Dr. Elner  Dental Screening: Recommended annual dental exams for proper oral hygiene  Community Resource Referral / Chronic Care Management: CRR required this visit?  No   CCM required this visit?  No   Plan:    I have personally reviewed and noted the following in the patient's chart:   Medical and social history Use of alcohol, tobacco or illicit drugs  Current medications and supplements including opioid prescriptions. Patient is not currently taking opioid prescriptions. Functional ability and status Nutritional status Physical activity Advanced directives List of other physicians Hospitalizations, surgeries, and ER visits in previous 12 months Vitals Screenings to include cognitive,  depression, and falls Referrals and appointments  In addition, I have reviewed and discussed with patient certain preventive protocols, quality metrics, and best practice recommendations. A written personalized care plan for preventive services as well as general preventive health recommendations were provided to patient.   Ardella FORBES Dawn, LPN   [id number]   After Visit Summary: (In Person-Declined) Patient declined AVS at this time.  Notes: Nothing significant to report at this time.

## 2023-12-20 NOTE — Patient Instructions (Signed)
 Rita Wright,  Thank you for taking the time for your Medicare Wellness Visit. I appreciate your continued commitment to your health goals. Please review the care plan we discussed, and feel free to reach out if I can assist you further.  Medicare recommends these wellness visits once per year to help you and your care team stay ahead of potential health issues. These visits are designed to focus on prevention, allowing your provider to concentrate on managing your acute and chronic conditions during your regular appointments.  Please note that Annual Wellness Visits do not include a physical exam. Some assessments may be limited, especially if the visit was conducted virtually. If needed, we may recommend a separate in-person follow-up with your provider.  Ongoing Care Seeing your primary care provider every 3 to 6 months helps us  monitor your health and provide consistent, personalized care.   Referrals If a referral was made during today's visit and you haven't received any updates within two weeks, please contact the referred provider directly to check on the status.  Recommended Screenings:  Health Maintenance  Topic Date Due   Zoster (Shingles) Vaccine (1 of 2) Never done   COVID-19 Vaccine (3 - Pfizer risk series) 06/25/2019   Breast Cancer Screening  01/02/2023   Flu Shot  09/23/2023   DTaP/Tdap/Td vaccine (2 - Td or Tdap) 12/22/2023*   Cologuard (Stool DNA test)  03/06/2024   Medicare Annual Wellness Visit  12/19/2024   Pneumococcal Vaccine for age over 45  Completed   DEXA scan (bone density measurement)  Completed   Hepatitis C Screening  Completed   Meningitis B Vaccine  Aged Out   Colon Cancer Screening  Discontinued  *Topic was postponed. The date shown is not the original due date.       12/20/2023    3:00 PM  Advanced Directives  Does Patient Have a Medical Advance Directive? No  Would patient like information on creating a medical advance directive? No - Patient  declined   Advance Care Planning is important because it: Ensures you receive medical care that aligns with your values, goals, and preferences. Provides guidance to your family and loved ones, reducing the emotional burden of decision-making during critical moments.  Vision: Annual vision screenings are recommended for early detection of glaucoma, cataracts, and diabetic retinopathy. These exams can also reveal signs of chronic conditions such as diabetes and high blood pressure.  Dental: Annual dental screenings help detect early signs of oral cancer, gum disease, and other conditions linked to overall health, including heart disease and diabetes.  Please see the attached documents for additional preventive care recommendations.

## 2024-01-31 ENCOUNTER — Other Ambulatory Visit: Payer: Self-pay | Admitting: Cardiology

## 2024-01-31 ENCOUNTER — Other Ambulatory Visit: Payer: Self-pay | Admitting: Medical

## 2024-01-31 NOTE — Telephone Encounter (Signed)
 Refill request   Last appointment [facility] 06/15/23

## 2024-02-10 ENCOUNTER — Inpatient Hospital Stay: Attending: Hematology and Oncology

## 2024-02-10 ENCOUNTER — Telehealth: Payer: Self-pay | Admitting: Hematology and Oncology

## 2024-02-10 ENCOUNTER — Inpatient Hospital Stay: Admitting: Hematology and Oncology

## 2024-02-10 NOTE — Telephone Encounter (Signed)
Left vm for pt to call back to reschedule missed appt

## 2024-02-14 ENCOUNTER — Telehealth: Payer: Self-pay | Admitting: Hematology and Oncology

## 2024-02-14 NOTE — Telephone Encounter (Signed)
 I spoke w/ pt regarding rescheduling 12/19 appt. Something has came up pt will call to reschedule.
